# Patient Record
Sex: Female | Born: 1970
Health system: Southern US, Community
[De-identification: ages and names within clinical notes are randomized; demographics above are authoritative.]

## PROBLEM LIST (undated history)

## (undated) DIAGNOSIS — C50919 Malignant neoplasm of unspecified site of unspecified female breast: Secondary | ICD-10-CM

## (undated) DIAGNOSIS — N390 Urinary tract infection, site not specified: Secondary | ICD-10-CM

## (undated) DIAGNOSIS — I10 Essential (primary) hypertension: Secondary | ICD-10-CM

## (undated) HISTORY — PX: BREAST SURGERY: SHX581

## (undated) HISTORY — PX: ROTATOR CUFF REPAIR: SHX139

## (undated) HISTORY — PX: CHOLECYSTECTOMY: SHX55

---

## 1999-05-22 ENCOUNTER — Other Ambulatory Visit: Admission: RE | Admit: 1999-05-22 | Discharge: 1999-05-22 | Payer: Self-pay | Admitting: Obstetrics and Gynecology

## 1999-06-07 ENCOUNTER — Ambulatory Visit (HOSPITAL_COMMUNITY): Admission: RE | Admit: 1999-06-07 | Discharge: 1999-06-07 | Payer: Self-pay | Admitting: Obstetrics and Gynecology

## 1999-06-07 ENCOUNTER — Encounter: Payer: Self-pay | Admitting: Obstetrics and Gynecology

## 1999-07-02 ENCOUNTER — Inpatient Hospital Stay (HOSPITAL_COMMUNITY): Admission: AD | Admit: 1999-07-02 | Discharge: 1999-07-02 | Payer: Self-pay | Admitting: Obstetrics and Gynecology

## 2009-11-18 ENCOUNTER — Emergency Department (HOSPITAL_COMMUNITY): Admission: EM | Admit: 2009-11-18 | Discharge: 2009-11-18 | Payer: Self-pay | Admitting: Emergency Medicine

## 2011-01-24 ENCOUNTER — Emergency Department (HOSPITAL_BASED_OUTPATIENT_CLINIC_OR_DEPARTMENT_OTHER)
Admission: EM | Admit: 2011-01-24 | Discharge: 2011-01-24 | Discharge status: Against Medical Advice | Payer: Managed Care, Other (non HMO) | Attending: Emergency Medicine | Admitting: Emergency Medicine

## 2011-01-24 DIAGNOSIS — G43909 Migraine, unspecified, not intractable, without status migrainosus: Secondary | ICD-10-CM | POA: Insufficient documentation

## 2011-02-05 ENCOUNTER — Emergency Department (HOSPITAL_BASED_OUTPATIENT_CLINIC_OR_DEPARTMENT_OTHER)
Admission: EM | Admit: 2011-02-05 | Discharge: 2011-02-06 | Disposition: A | Discharge status: Nursing Home (Includes ICF) | Payer: Managed Care, Other (non HMO) | Source: Home / Self Care | Attending: Emergency Medicine | Admitting: Emergency Medicine

## 2011-02-05 DIAGNOSIS — R1013 Epigastric pain: Secondary | ICD-10-CM | POA: Insufficient documentation

## 2011-02-05 DIAGNOSIS — K819 Cholecystitis, unspecified: Secondary | ICD-10-CM | POA: Insufficient documentation

## 2011-02-05 DIAGNOSIS — K859 Acute pancreatitis without necrosis or infection, unspecified: Secondary | ICD-10-CM | POA: Insufficient documentation

## 2011-02-05 LAB — COMPREHENSIVE METABOLIC PANEL
ALT: 24 U/L (ref 0–35)
AST: 53 U/L — ABNORMAL HIGH (ref 0–37)
Albumin: 4.4 g/dL (ref 3.5–5.2)
Alkaline Phosphatase: 87 U/L (ref 39–117)
Calcium: 9.1 mg/dL (ref 8.4–10.5)
GFR calc Af Amer: >60 mL/min (ref >60)
Glucose, Bld: 104 mg/dL — ABNORMAL HIGH (ref 70–99)
Potassium: 3.5 mEq/L (ref 3.5–5.1)
Sodium: 142 mEq/L (ref 135–145)
Total Protein: 7.6 g/dL (ref 6.0–8.3)

## 2011-02-05 LAB — URINALYSIS, ROUTINE W REFLEX MICROSCOPIC
Bilirubin Urine: NEGATIVE
Glucose, UA: NEGATIVE mg/dL
Hgb urine dipstick: NEGATIVE
Ketones, ur: NEGATIVE mg/dL
Leukocytes, UA: NEGATIVE
Nitrite: NEGATIVE
Protein, ur: NEGATIVE mg/dL
Specific Gravity, Urine: 1.018 (ref 1.005–1.030)
Urobilinogen, UA: 0.2 mg/dL (ref 0.0–1.0)
pH: 7.5 (ref 5.0–8.0)

## 2011-02-05 LAB — DIFFERENTIAL
Basophils Absolute: 0 10*3/uL (ref 0.0–0.1)
Basophils Relative: 0 % (ref 0–1)
Eosinophils Absolute: 0.3 10*3/uL (ref 0.0–0.7)
Eosinophils Relative: 3 % (ref 0–5)
Lymphocytes Relative: 28 % (ref 12–46)
Lymphs Abs: 2.7 10*3/uL (ref 0.7–4.0)
Monocytes Absolute: 0.6 10*3/uL (ref 0.1–1.0)
Monocytes Relative: 6 % (ref 3–12)
Neutro Abs: 6.1 10*3/uL (ref 1.7–7.7)
Neutrophils Relative %: 63 % (ref 43–77)

## 2011-02-05 LAB — URINE MICROSCOPIC-ADD ON

## 2011-02-05 LAB — CBC
HCT: 40.9 % (ref 36.0–46.0)
Platelets: 244 10*3/uL (ref 150–400)
RDW: 12.6 % (ref 11.5–15.5)
WBC: 9.7 10*3/uL (ref 4.0–10.5)

## 2011-02-05 LAB — PREGNANCY, URINE: Preg Test, Ur: NEGATIVE

## 2011-02-06 ENCOUNTER — Inpatient Hospital Stay (HOSPITAL_COMMUNITY): Payer: Managed Care, Other (non HMO)

## 2011-02-06 ENCOUNTER — Inpatient Hospital Stay (HOSPITAL_COMMUNITY)
Admission: EM | Admit: 2011-02-06 | Discharge: 2011-02-10 | DRG: 417 | Disposition: A | Discharge status: Home / Self Care | Payer: Managed Care, Other (non HMO) | Source: Ambulatory Visit | Attending: Surgery | Admitting: Surgery

## 2011-02-06 ENCOUNTER — Emergency Department (INDEPENDENT_AMBULATORY_CARE_PROVIDER_SITE_OTHER): Payer: Managed Care, Other (non HMO)

## 2011-02-06 DIAGNOSIS — R109 Unspecified abdominal pain: Secondary | ICD-10-CM

## 2011-02-06 DIAGNOSIS — G43909 Migraine, unspecified, not intractable, without status migrainosus: Secondary | ICD-10-CM | POA: Diagnosis present

## 2011-02-06 DIAGNOSIS — K8010 Calculus of gallbladder with chronic cholecystitis without obstruction: Principal | ICD-10-CM | POA: Diagnosis present

## 2011-02-06 DIAGNOSIS — K7689 Other specified diseases of liver: Secondary | ICD-10-CM | POA: Diagnosis present

## 2011-02-06 DIAGNOSIS — K859 Acute pancreatitis without necrosis or infection, unspecified: Secondary | ICD-10-CM | POA: Diagnosis present

## 2011-02-06 MED ORDER — IOHEXOL 300 MG/ML  SOLN
100.0000 mL | Freq: Once | INTRAMUSCULAR | Status: AC | PRN
  Administered 2011-02-06: 100 mL via INTRAVENOUS

## 2011-02-07 LAB — CBC
HCT: 42.4 % (ref 36.0–46.0)
Hemoglobin: 14.4 g/dL (ref 12.0–15.0)
MCHC: 34 g/dL (ref 30.0–36.0)
MCV: 84.6 fL (ref 78.0–100.0)
RDW: 13.1 % (ref 11.5–15.5)

## 2011-02-07 LAB — COMPREHENSIVE METABOLIC PANEL
ALT: 285 U/L — ABNORMAL HIGH (ref 0–35)
Alkaline Phosphatase: 88 U/L (ref 39–117)
BUN: 6 mg/dL (ref 6–23)
CO2: 27 mEq/L (ref 19–32)
Calcium: 8.4 mg/dL (ref 8.4–10.5)
GFR calc non Af Amer: >60 mL/min (ref >60)
Glucose, Bld: 106 mg/dL — ABNORMAL HIGH (ref 70–99)
Total Protein: 6.9 g/dL (ref 6.0–8.3)

## 2011-02-07 LAB — LIPASE, BLOOD: Lipase: 24 U/L (ref 11–59)

## 2011-02-08 ENCOUNTER — Other Ambulatory Visit: Payer: Self-pay | Admitting: Surgery

## 2011-02-08 ENCOUNTER — Inpatient Hospital Stay (HOSPITAL_COMMUNITY): Payer: Managed Care, Other (non HMO)

## 2011-02-08 LAB — MRSA PCR SCREENING: MRSA by PCR: NEGATIVE

## 2011-02-08 NOTE — H&P (Signed)
  [name], [name]             ACCOUNT NO.:  [id number]  MEDICAL RECORD NO.:  [id number]           PATIENT TYPE:  E  LOCATION:  [facility]                         FACILITY:  [facility]  PHYSICIAN:  Wilmon Arms. Corliss Skains, M.D. DATE OF BIRTH:  June 12, 1971  DATE OF ADMISSION:  02/06/2011 DATE OF DISCHARGE:                             HISTORY & PHYSICAL   CHIEF COMPLAINT:  Abdominal pain.  HISTORY OF PRESENT ILLNESS:  This is a healthy 39-year female who presents with about 10 hours of crampy epigastric abdominal pain.  She denies any nausea, vomiting or diarrhea.  The pain has persisted and her abdomen was quite distended.  She denies any nausea, vomiting or diarrhea.  She has been afebrile.  She was evaluated at [facility], who performed a CT scan of the abdomen and pelvis.  She is now transferred for further management.  PAST MEDICAL HISTORY:  Migraine headaches.  PAST SURGICAL HISTORY:  None.  FAMILY HISTORY:  Noncontributory.  SOCIAL HISTORY:  Occasional alcohol use, nonsmoker.  REVIEW OF SYSTEMS:  Last menstrual period on January 21, 2011.  CURRENT MEDICATIONS:  Daily vitamins.  ALLERGIES:  VERSED which causes a rash.  PHYSICAL EXAMINATION:  VITAL SIGNS:  Temperature 98.4, pulse 85, blood pressure 115/96, respiratory rate is 20. GENERAL:  A well-developed, well-nourished female in no apparent distress. HEENT:  EOMI.  Sclerae icteric. NECK:  No mass or thyromegaly. LUNGS:  Clear.  Normal respiratory effort. HEART:  Regular rate and rhythm.  No murmur. ABDOMEN:  Hypoactive bowel sounds, distended abdomen, soft, tender in the epigastrium and right upper quadrant.  No palpable mass. EXTREMITIES:  No edema. SKIN:  With dryness and no sign of jaundice.  LABORATORY DATA:  Lipase greater than 2000.  White count 9.7, hemoglobin 14.6.  Electrolytes within normal limits.  Total bilirubin 1.1, alk phos 87, AST 53, ALT 24.  Urine pregnancy is negative.  CT scan of the abdomen and  pelvis shows pericholecystic edema, but no parapancreatic edema.  She does have fatty liver.  IMPRESSION:  Acute cholecystitis and probable gallstone pancreatitis.  PLAN:  We will admit the patient for IV antibiotics, n.p.o. status.  We will obtain an ultrasound to better define her biliary tree.  We will repeat her blood work.     Wilmon Arms. Corliss Skains, M.D.     MKT/MEDQ  D:  02/06/2011  T:  02/06/2011  Job:  [id number]  Electronically Signed by Manus Rudd M.D. on 02/08/2011 10:31:09 AM

## 2011-02-09 LAB — COMPREHENSIVE METABOLIC PANEL
ALT: 136 U/L — ABNORMAL HIGH (ref 0–35)
AST: 53 U/L — ABNORMAL HIGH (ref 0–37)
Albumin: 3.6 g/dL (ref 3.5–5.2)
CO2: 31 mEq/L (ref 19–32)
Calcium: 8.4 mg/dL (ref 8.4–10.5)
Creatinine, Ser: 0.7 mg/dL (ref 0.4–1.2)
GFR calc Af Amer: >60 mL/min (ref >60)
Sodium: 138 mEq/L (ref 135–145)

## 2011-02-09 LAB — CBC
Hemoglobin: 12.7 g/dL (ref 12.0–15.0)
MCH: 29.3 pg (ref 26.0–34.0)
MCHC: 34.3 g/dL (ref 30.0–36.0)
Platelets: 214 10*3/uL (ref 150–400)
RBC: 4.33 MIL/uL (ref 3.87–5.11)

## 2011-02-21 NOTE — Discharge Summary (Signed)
  [name], [name]             ACCOUNT NO.:  [id number]  MEDICAL RECORD NO.:  [id number]           PATIENT TYPE:  I  LOCATION:  4507                         FACILITY:  [facility]  PHYSICIAN:  Annette Park. Daphine Deutscher, MD  DATE OF BIRTH:  [birth year]  DATE OF ADMISSION:  02/06/2011 DATE OF DISCHARGE:                              DISCHARGE SUMMARY   HISTORY OF PRESENT ILLNESS:  [name] is a pleasant 40 year old African American female who presents with abdominal pain to the emergency department.  She was evaluated there and had a CT scan which showed pericholecystic edema.  Her labs were normal with the exception of her lipase which was significantly elevated at greater than 2000. Dr. Corliss Skains evaluated the patient and decided to admit her for further workup.  SUMMARY OF HOSPITAL COURSE:  The patient was admitted on February 06, 2011. An ultrasound was performed after admission showing evidence of gallstones as well as pericholecystic edema.  Her labs within 24-48 hours normalized and decision was made that the patient would be able to proceed with cholecystectomy.  She was taken to the operating room on February 08, 2011, and underwent laparoscopic cholecystectomy with cholangiogram showing no evidence of residual filling defect. Postoperatively, the patient had some pain control issues and the fact that the opioid analgesics caused severe itching for her, however, she was willing to tolerate the teaching for better pain control.  However, here on postop day #2, that seems to be under better control and the patient is requesting to go home.  She has been eating some, but not having any nausea or vomiting and her pain is well controlled.  At this point, we feel she is stable for discharge home.  DISCHARGE DIAGNOSIS: 1. Biliary pancreatitis - resolved. 2. Cholelithiasis and cholecystitis, status post laparoscopic     cholecystectomy.  DISCHARGE MEDICATIONS:  Percocet 1-2 tablets q.6 h.  p.r.n. pain as well as ibuprofen 600 mg 1 tablet 3 times daily with meals.  Plan to see her back in approximately 2 weeks' time for further followup.     Brayton El, PA-C   ______________________________ Annette Park Daphine Deutscher, MD    KB/MEDQ  D:  02/10/2011  T:  02/10/2011  Job:  [id number]  Electronically Signed by Brayton El  on 02/11/2011 02:11:08 PM Electronically Signed by Luretha Murphy MD on 02/21/2011 08:37:51 AM

## 2011-02-21 NOTE — Op Note (Signed)
  NAMEOLUWANIFEMI, PETITTI             ACCOUNT NO.:  [id number]  MEDICAL RECORD NO.:  [id number]           PATIENT TYPE:  I  LOCATION:  4507                         FACILITY:  [facility]  PHYSICIAN:  Thornton Park. Daphine Deutscher, MD  DATE OF BIRTH:  Aug 06, 1971  DATE OF PROCEDURE:  02/08/2011 DATE OF DISCHARGE:                              OPERATIVE REPORT   PREOPERATIVE DIAGNOSIS:  Cholecystitis.  POSTOPERATIVE DIAGNOSIS:  Chronic cholecystitis.  PROCEDURE:  Laparoscopic cholecystectomy and intraoperative cholangiogram.  SURGEON:  Thornton Park. Daphine Deutscher, MD  ASSISTANT:  None.  ANESTHESIA:  General endotracheal.  FINDINGS:  Intraoperative cholangiogram normal.  DESCRIPTION OF PROCEDURE:  This 40 year old African American female came in with high spiking lipase consistent with biliary pancreatitis on February 06, 2011.  She cooled off and is brought to the OR at this time for laparoscopic cholecystectomy.  The patient was taken to room #17 and given general anesthesia.  The abdomen was prepped with PCMX and draped sterilely.  I entered the abdomen through the right upper quadrant using a 0-degree 5-mm OptiVu technique without difficulty.  The gallbladder was easily visualized, grasped, and elevated.  However, I went ahead and used the scope there and then put in a second 5 below the umbilicus, another 5 on the right side and then a 11 obliquely in the left upper quadrant.  Dissection was then continued and as I lifted up the gallbladder, the infundibulum was really stuck to the duodenum.  I gently peeled this away.  These were chronic adhesions.  This freed up the Calot triangle and I was able to get a critical view.  She had a fairly prominent vessel that was pulsating which I left in place and stayed out to the gallbladder side of that.  I put clips on the gallbladder and on the vessel and then incised the cystic duct milking back a little bit of inspissated black pigment stones.  The  cholangiogram was obtained using a Reddick catheter showing good intrahepatic filling and free flow in the duodenum.  I did not see filling of a pancreatic duct.  The cystic duct was then triple clipped and divided and the cystic artery was triple clipped and divided and I stayed right on the gallbladder and removed the gallbladder without entering it.  It was then placed in a bag and brought out through the upper port taking some care to milk out the stones.  She contained a lot of stony material of different sizes.  Ports were all injected with Marcaine.  I went back and cauterized the fundus area of the liver.  Everything else looked to be in order.  No bleeding or bile leaks were noted.  We noted her to have a dilated bladder and a renal cath was performed at the end of this procedure. The patient was taken to the recovery room in satisfactory condition.     Thornton Park Daphine Deutscher, MD     MBM/MEDQ  D:  02/08/2011  T:  02/09/2011  Job:  [id number]  Electronically Signed by Luretha Murphy MD on 02/21/2011 08:37:54 AM

## 2011-04-01 ENCOUNTER — Emergency Department (HOSPITAL_BASED_OUTPATIENT_CLINIC_OR_DEPARTMENT_OTHER)
Admission: EM | Admit: 2011-04-01 | Discharge: 2011-04-01 | Disposition: A | Discharge status: Home / Self Care | Payer: Managed Care, Other (non HMO) | Attending: Emergency Medicine | Admitting: Emergency Medicine

## 2011-04-01 DIAGNOSIS — IMO0002 Reserved for concepts with insufficient information to code with codable children: Secondary | ICD-10-CM | POA: Insufficient documentation

## 2011-05-29 ENCOUNTER — Encounter: Payer: Self-pay | Admitting: *Deleted

## 2011-05-29 ENCOUNTER — Emergency Department (HOSPITAL_BASED_OUTPATIENT_CLINIC_OR_DEPARTMENT_OTHER)
Admission: EM | Admit: 2011-05-29 | Discharge: 2011-05-29 | Discharge status: Against Medical Advice | Payer: 59 | Attending: Emergency Medicine | Admitting: Emergency Medicine

## 2011-05-29 DIAGNOSIS — G43909 Migraine, unspecified, not intractable, without status migrainosus: Secondary | ICD-10-CM | POA: Insufficient documentation

## 2011-05-29 NOTE — ED Notes (Signed)
In to talk with adult female and pt-advised EDP of his concerns and that he advised he is working with 2 pts that have been here x 4 hrs and he would be in asap-adult female nodded head in agreement-pt did not speak to me but I did advise her that it was her decision to wait to be seen

## 2011-05-29 NOTE — ED Notes (Signed)
Pt and adult female not in room-was not seen leaving or advised of pt leaving

## 2011-05-29 NOTE — ED Notes (Signed)
Adult female with pt to nse desk-? When pt would be seen-in room to talk with him-advised that EDP has signed up for pt and he would be in ASAP-ED extremely busy and that RN can not give meds until seen by EDP-he states pt has not been seen x 1.5 hrs and that they are leaving if not seen "in the next few minutes"-pt lying on stretcher on right side-NAD-did not look up or speak to this RN while in room-EDP notified of the above

## 2011-05-29 NOTE — ED Notes (Signed)
Pt c/o "migraine " x 3 hrs  

## 2011-05-30 ENCOUNTER — Encounter (HOSPITAL_BASED_OUTPATIENT_CLINIC_OR_DEPARTMENT_OTHER): Payer: Self-pay

## 2011-05-30 ENCOUNTER — Emergency Department (HOSPITAL_BASED_OUTPATIENT_CLINIC_OR_DEPARTMENT_OTHER)
Admission: EM | Admit: 2011-05-30 | Discharge: 2011-05-30 | Disposition: A | Discharge status: Home / Self Care | Payer: 59 | Attending: Emergency Medicine | Admitting: Emergency Medicine

## 2011-05-30 DIAGNOSIS — R51 Headache: Secondary | ICD-10-CM | POA: Insufficient documentation

## 2011-05-30 MED ORDER — SODIUM CHLORIDE 0.9 % IV BOLUS (SEPSIS)
1000.0000 mL | Freq: Once | INTRAVENOUS | Status: AC
  Administered 2011-05-30 (×2): 1000 mL via INTRAVENOUS

## 2011-05-30 MED ORDER — DEXAMETHASONE SODIUM PHOSPHATE 10 MG/ML IJ SOLN
10.0000 mg | Freq: Once | INTRAMUSCULAR | Status: AC
  Administered 2011-05-30: 10 mg via INTRAVENOUS
  Filled 2011-05-30: qty 1

## 2011-05-30 MED ORDER — FAMOTIDINE IN NACL 20-0.9 MG/50ML-% IV SOLN
20.0000 mg | Freq: Once | INTRAVENOUS | Status: AC
  Administered 2011-05-30: 20 mg via INTRAVENOUS
  Filled 2011-05-30: qty 50

## 2011-05-30 MED ORDER — DIPHENHYDRAMINE HCL 50 MG/ML IJ SOLN
25.0000 mg | Freq: Once | INTRAMUSCULAR | Status: AC
  Administered 2011-05-30: 25 mg via INTRAVENOUS
  Filled 2011-05-30: qty 1

## 2011-05-30 NOTE — ED Provider Notes (Signed)
History     CSN: [id number] Arrival date & time: 05/30/2011  6:13 PM  Chief Complaint  Patient presents with  . Migraine   Patient is a 40 y.o. female presenting with migraine. The history is provided by the patient.  Migraine This is a recurrent problem. The current episode started yesterday. The problem occurs constantly. The problem has not changed since onset.Pertinent negatives include no chest pain, no abdominal pain, no headaches and no shortness of breath. The symptoms are aggravated by stress. The symptoms are relieved by nothing. She has tried nothing for the symptoms.  feelsl like her typical MHAs, R sided throbbing in nature, no associated F/C, neck stiffness, rash or weakness/ numbness  Past Medical History  Diagnosis Date  . Migraine     Past Surgical History  Procedure Date  . Liver biopsy   . Cholecystectomy     History reviewed. No pertinent family history.  History  Substance Use Topics  . Smoking status: Never Smoker   . Smokeless tobacco: Not on file  . Alcohol Use: No    OB History    Grav Para Term Preterm Abortions TAB SAB Ect Mult Living                  Review of Systems  Constitutional: Negative for fever and chills.  HENT: Negative for neck pain and neck stiffness.   Eyes: Negative for pain.  Respiratory: Negative for shortness of breath.   Cardiovascular: Negative for chest pain.  Gastrointestinal: Negative for abdominal pain.  Genitourinary: Negative for dysuria.  Musculoskeletal: Negative for back pain.  Skin: Negative for rash.  Neurological: Negative for headaches.  All other systems reviewed and are negative.    Physical Exam  BP 158/107  Pulse 80  Temp(Src) 98.7 F (37.1 C) (Oral)  Resp 16  SpO2 100%  LMP 05/16/2011  Physical Exam  Constitutional: She is oriented to person, place, and time. She appears well-developed and well-nourished.  HENT:  Head: Normocephalic and atraumatic.  Eyes: Conjunctivae and EOM are  normal. Pupils are equal, round, and reactive to light.  Neck: Full passive range of motion without pain. Neck supple. No thyromegaly present.       No meningismus  Cardiovascular: Normal rate, regular rhythm, S1 normal, S2 normal and intact distal pulses.   Pulmonary/Chest: Effort normal and breath sounds normal.  Abdominal: Soft. Bowel sounds are normal. There is no tenderness. There is no CVA tenderness.  Musculoskeletal: Normal range of motion.  Neurological: She is alert and oriented to person, place, and time. She has normal strength and normal reflexes. No cranial nerve deficit or sensory deficit. She displays a negative Romberg sign. GCS eye subscore is 4. GCS verbal subscore is 5. GCS motor subscore is 6.       Normal Gait  Skin: Skin is warm and dry. No rash noted. No cyanosis. Nails show no clubbing.  Psychiatric: She has a normal mood and affect. Her speech is normal and behavior is normal.    ED Course  Procedures  MDM Clinical presentation c/w MHA, no neuro deficts, HA cocktail and IVFs provided.   7:14 PM improved on recheck exam, no change normal neuro exam.       Sunnie Nielsen, MD 05/30/11 [phone]

## 2011-07-22 ENCOUNTER — Other Ambulatory Visit: Payer: Self-pay | Admitting: Orthopaedic Surgery

## 2011-07-22 DIAGNOSIS — M25511 Pain in right shoulder: Secondary | ICD-10-CM

## 2011-07-29 NOTE — ED Provider Notes (Signed)
PATIENT NOT SEEN BY ME. SEEN BY DR OPITZ.     Shelda Jakes, MD 07/29/11 [phone]

## 2011-07-31 ENCOUNTER — Encounter (HOSPITAL_BASED_OUTPATIENT_CLINIC_OR_DEPARTMENT_OTHER): Payer: Self-pay

## 2011-07-31 ENCOUNTER — Emergency Department (HOSPITAL_BASED_OUTPATIENT_CLINIC_OR_DEPARTMENT_OTHER)
Admission: EM | Admit: 2011-07-31 | Discharge: 2011-07-31 | Disposition: A | Discharge status: Home / Self Care | Payer: 59 | Attending: Emergency Medicine | Admitting: Emergency Medicine

## 2011-07-31 DIAGNOSIS — N390 Urinary tract infection, site not specified: Secondary | ICD-10-CM

## 2011-07-31 LAB — URINE MICROSCOPIC-ADD ON

## 2011-07-31 LAB — URINALYSIS, ROUTINE W REFLEX MICROSCOPIC
Bilirubin Urine: NEGATIVE
Glucose, UA: NEGATIVE mg/dL
Ketones, ur: NEGATIVE mg/dL
Specific Gravity, Urine: 1.023 (ref 1.005–1.030)
pH: 7.5 (ref 5.0–8.0)

## 2011-07-31 MED ORDER — PHENAZOPYRIDINE HCL 100 MG PO TABS
200.0000 mg | ORAL_TABLET | Freq: Once | ORAL | Status: AC
  Administered 2011-07-31: 200 mg via ORAL
  Filled 2011-07-31: qty 2

## 2011-07-31 MED ORDER — CIPROFLOXACIN HCL 500 MG PO TABS: 500.0000 mg | ORAL_TABLET | Freq: Two times a day (BID) | ORAL | Status: AC

## 2011-07-31 MED ORDER — OXYCODONE-ACETAMINOPHEN 5-325 MG PO TABS
1.0000 | ORAL_TABLET | Freq: Once | ORAL | Status: AC
  Administered 2011-07-31: 1 via ORAL
  Filled 2011-07-31: qty 1

## 2011-07-31 MED ORDER — PHENAZOPYRIDINE HCL 200 MG PO TABS: 200.0000 mg | ORAL_TABLET | Freq: Three times a day (TID) | ORAL | Status: AC

## 2011-07-31 MED ORDER — CIPROFLOXACIN HCL 500 MG PO TABS
500.0000 mg | ORAL_TABLET | Freq: Once | ORAL | Status: AC
  Administered 2011-07-31: 500 mg via ORAL
  Filled 2011-07-31: qty 1

## 2011-07-31 MED ORDER — HYDROCODONE-ACETAMINOPHEN 5-325 MG PO TABS: 1.0000 | ORAL_TABLET | ORAL | Status: AC | PRN

## 2011-07-31 NOTE — ED Provider Notes (Signed)
History     CSN: [id number] Arrival date & time: 07/31/2011  9:22 PM  Chief Complaint  Patient presents with  . Urinary Tract Infection    (Consider location/radiation/quality/duration/timing/severity/associated sxs/prior treatment) Patient is a 40 y.o. female presenting with urinary tract infection. The history is provided by the patient.  Urinary Tract Infection   Patient reports 3 days of urinary frequency and dysuria.  No vaginal discharge.  No fevers nausea or vomiting.  No flank pain.  Today she developed a small amount of hematuria and therefore came to the emergency department.  She has no history of ureteral stones either.  She reports this is similar to urinary tract infection she said before in the past.  She was trying over-the-counter medications such as days ago as well as cranberry juice without relief.  Her symptoms are moderate in nature.  They're not improved by anything.  Are not worsened by Past Medical History  Diagnosis Date  . Migraine     Past Surgical History  Procedure Date  . Liver biopsy   . Cholecystectomy     No family history on file.  History  Substance Use Topics  . Smoking status: Never Smoker   . Smokeless tobacco: Not on file  . Alcohol Use: No    OB History    Grav Para Term Preterm Abortions TAB SAB Ect Mult Living                  Review of Systems  All other systems reviewed and are negative.    Allergies  Versed  Home Medications   Current Outpatient Rx  Name Route Sig Dispense Refill  . ASPIRIN-ACETAMINOPHEN-CAFFEINE 250-250-65 MG PO TABS Oral Take 3 tablets by mouth once as needed. For migraine    . CIPROFLOXACIN HCL 500 MG PO TABS Oral Take 1 tablet (500 mg total) by mouth every 12 (twelve) hours. 14 tablet 0  . HYDROCODONE-ACETAMINOPHEN 5-325 MG PO TABS Oral Take 1 tablet by mouth every 4 (four) hours as needed for pain. 10 tablet 0  . PHENAZOPYRIDINE HCL 200 MG PO TABS Oral Take 1 tablet (200 mg total) by mouth 3  (three) times daily. 6 tablet 0    BP 160/101  Pulse 76  Temp(Src) 98 F (36.7 C) (Oral)  Resp 16  Ht 5\' 1"  (1.549 m)  Wt 165 lb (74.844 kg)  BMI 31.18 kg/m2  SpO2 99%  LMP 07/23/2011  Physical Exam  Constitutional: She is oriented to person, place, and time. She appears well-developed and well-nourished.  HENT:  Head: Normocephalic.  Eyes: EOM are normal.  Neck: Normal range of motion.  Pulmonary/Chest: Effort normal.  Abdominal: Soft. She exhibits no distension. There is no tenderness.  Musculoskeletal: Normal range of motion.  Neurological: She is alert and oriented to person, place, and time.  Psychiatric: She has a normal mood and affect.    ED Course  Procedures (including critical care time)  Labs Reviewed  URINALYSIS, ROUTINE W REFLEX MICROSCOPIC - Abnormal; Notable for the following:    Appearance CLOUDY (*)    Hgb urine dipstick MODERATE (*)    Protein, ur 30 (*)    Leukocytes, UA MODERATE (*)    All other components within normal limits  URINE MICROSCOPIC-ADD ON - Abnormal; Notable for the following:    Squamous Epithelial / LPF FEW (*)    Bacteria, UA FEW (*)    All other components within normal limits  PREGNANCY, URINE   No results found.  1. Urinary tract infection       MDM  Uncomplicated urinary tract infection.  No signs of pyelonephritis.  Doubt secondarily infected ureteral stone        Lyanne Co, MD 07/31/11 2332

## 2011-07-31 NOTE — ED Notes (Signed)
Dysuria, hematuria

## 2011-08-02 ENCOUNTER — Ambulatory Visit
Admission: RE | Admit: 2011-08-02 | Discharge: 2011-08-02 | Disposition: A | Discharge status: Home / Self Care | Payer: 59 | Source: Ambulatory Visit | Attending: Orthopaedic Surgery | Admitting: Orthopaedic Surgery

## 2011-08-02 DIAGNOSIS — M25511 Pain in right shoulder: Secondary | ICD-10-CM

## 2011-08-02 MED ORDER — IOHEXOL 180 MG/ML  SOLN
15.0000 mL | Freq: Once | INTRAMUSCULAR | Status: AC | PRN
  Administered 2011-08-02: 15 mL via INTRATHECAL

## 2011-09-30 ENCOUNTER — Encounter (HOSPITAL_BASED_OUTPATIENT_CLINIC_OR_DEPARTMENT_OTHER): Payer: Self-pay | Admitting: Family Medicine

## 2011-09-30 ENCOUNTER — Emergency Department (HOSPITAL_BASED_OUTPATIENT_CLINIC_OR_DEPARTMENT_OTHER)
Admission: EM | Admit: 2011-09-30 | Discharge: 2011-09-30 | Disposition: A | Discharge status: Home / Self Care | Payer: 59 | Attending: Emergency Medicine | Admitting: Emergency Medicine

## 2011-09-30 DIAGNOSIS — G43909 Migraine, unspecified, not intractable, without status migrainosus: Secondary | ICD-10-CM | POA: Insufficient documentation

## 2011-09-30 DIAGNOSIS — R110 Nausea: Secondary | ICD-10-CM | POA: Insufficient documentation

## 2011-09-30 MED ORDER — METOCLOPRAMIDE HCL 5 MG/ML IJ SOLN
10.0000 mg | Freq: Once | INTRAMUSCULAR | Status: AC
  Administered 2011-09-30: 10 mg via INTRAVENOUS
  Filled 2011-09-30: qty 2

## 2011-09-30 NOTE — ED Provider Notes (Signed)
History     CSN: [id number] Arrival date & time: 09/30/2011  8:21 AM   First MD Initiated Contact with Patient 09/30/11 [phone]      Chief Complaint  Patient presents with  . Migraine    (Consider location/radiation/quality/duration/timing/severity/associated sxs/prior treatment) HPI Complains of typical migraine headache onset upon awakening this morning progressively worsening pain is at left side of head nonradiating throbbing in nature accompanied by nausea and photophobia no other symptoms associated pain similar migraines she gets approximately once per week. Past Medical History  Diagnosis Date  . Migraine     Past Surgical History  Procedure Date  . Liver biopsy   . Cholecystectomy     No family history on file.  History  Substance Use Topics  . Smoking status: Never Smoker   . Smokeless tobacco: Not on file  . Alcohol Use: No    OB History    Grav Para Term Preterm Abortions TAB SAB Ect Mult Living                  Review of Systems  Constitutional: Negative.   HENT: Negative.   Eyes: Positive for photophobia.  Respiratory: Negative.   Cardiovascular: Negative.   Gastrointestinal: Positive for nausea.  Musculoskeletal: Negative.   Skin: Negative.   Neurological: Negative.        Headache  Hematological: Negative.   Psychiatric/Behavioral: Negative.   All other systems reviewed and are negative.    Allergies  Versed  Home Medications   Current Outpatient Rx  Name Route Sig Dispense Refill  . ASPIRIN-ACETAMINOPHEN-CAFFEINE 250-250-65 MG PO TABS Oral Take 2 tablets by mouth once as needed. For migraine    . PRENATAL VITAMINS PO Oral Take 1 tablet by mouth daily.        BP 145/94  Pulse 86  Temp(Src) 98 F (36.7 C) (Oral)  Resp 18  Ht 5\' 4"  (1.626 m)  Wt 135 lb (61.236 kg)  BMI 23.17 kg/m2  SpO2 99%  LMP 09/23/2011  Physical Exam  Nursing note and vitals reviewed. Constitutional: She is oriented to person, place, and time. She  appears well-developed and well-nourished. She appears distressed.       Glasgow Coma Score 15 appears uncomfortable  HENT:  Head: Normocephalic and atraumatic.  Eyes: Conjunctivae are normal. Pupils are equal, round, and reactive to light.       Photophobia  Neck: Neck supple. No tracheal deviation present. No thyromegaly present.  Cardiovascular: Normal rate and regular rhythm.   No murmur heard. Pulmonary/Chest: Effort normal and breath sounds normal.  Abdominal: Soft. Bowel sounds are normal. She exhibits no distension. There is no tenderness.  Musculoskeletal: Normal range of motion. She exhibits no edema and no tenderness.  Neurological: She is alert and oriented to person, place, and time. She has normal reflexes. No cranial nerve deficit. Coordination normal.  Skin: Skin is warm and dry. No rash noted.  Psychiatric: She has a normal mood and affect. Judgment and thought content normal.    ED Course  Procedures (including critical care time)  Labs Reviewed - No data to display No results found.   No diagnosis found.   9:40 AM feels improved after treatment with intravenous Reglan. Feels ready to home alert Glasgow Coma Score 15 appropriate MDM  Diagnosis migraine headache         Doug Sou, MD 09/30/11 [phone]

## 2011-09-30 NOTE — ED Notes (Signed)
Pt c/o "migraine" similar to migraines in past. Pt reports associated sx of nausea and photophobia.  Pt sts she normally takes "topamax" but is out of medication. Pt reports last migraine 2 months ago.

## 2011-11-08 ENCOUNTER — Encounter (HOSPITAL_BASED_OUTPATIENT_CLINIC_OR_DEPARTMENT_OTHER): Payer: Self-pay | Admitting: *Deleted

## 2011-11-08 ENCOUNTER — Emergency Department (HOSPITAL_BASED_OUTPATIENT_CLINIC_OR_DEPARTMENT_OTHER)
Admission: EM | Admit: 2011-11-08 | Discharge: 2011-11-09 | Disposition: A | Discharge status: Home / Self Care | Payer: 59 | Attending: Emergency Medicine | Admitting: Emergency Medicine

## 2011-11-08 DIAGNOSIS — Y93A9 Activity, other involving cardiorespiratory exercise: Secondary | ICD-10-CM | POA: Insufficient documentation

## 2011-11-08 DIAGNOSIS — S4980XA Other specified injuries of shoulder and upper arm, unspecified arm, initial encounter: Secondary | ICD-10-CM | POA: Insufficient documentation

## 2011-11-08 DIAGNOSIS — S4991XA Unspecified injury of right shoulder and upper arm, initial encounter: Secondary | ICD-10-CM

## 2011-11-08 DIAGNOSIS — S46909A Unspecified injury of unspecified muscle, fascia and tendon at shoulder and upper arm level, unspecified arm, initial encounter: Secondary | ICD-10-CM | POA: Insufficient documentation

## 2011-11-08 DIAGNOSIS — X58XXXA Exposure to other specified factors, initial encounter: Secondary | ICD-10-CM | POA: Insufficient documentation

## 2011-11-08 NOTE — ED Notes (Signed)
Pt presents to ED today with c/o roght shoulder pain that began after workout.  Pt had surgery to same area approx 2mos ago.

## 2011-11-08 NOTE — ED Notes (Addendum)
Pt last took ibuprofen 800mg  around 10am

## 2011-11-09 ENCOUNTER — Emergency Department (INDEPENDENT_AMBULATORY_CARE_PROVIDER_SITE_OTHER): Payer: 59

## 2011-11-09 DIAGNOSIS — M25519 Pain in unspecified shoulder: Secondary | ICD-10-CM

## 2011-11-09 MED ORDER — FENTANYL CITRATE 0.05 MG/ML IJ SOLN
50.0000 ug | Freq: Once | INTRAMUSCULAR | Status: AC
  Administered 2011-11-09: 50 ug via INTRAMUSCULAR
  Filled 2011-11-09: qty 2

## 2011-11-09 MED ORDER — HYDROCODONE-ACETAMINOPHEN 5-500 MG PO TABS: 1.0000 | ORAL_TABLET | Freq: Four times a day (QID) | ORAL | Status: DC | PRN

## 2011-11-09 NOTE — ED Provider Notes (Signed)
History     CSN: [id number]  Arrival date & time 11/08/11  2334   First MD Initiated Contact with Patient 11/08/11 2358      Chief Complaint  Patient presents with  . Shoulder Pain    (Consider location/radiation/quality/duration/timing/severity/associated sxs/prior treatment) Patient is a 41 y.o. female presenting with shoulder pain. The history is provided by the patient. No language interpreter was used.  Shoulder Pain This is a new problem. The current episode started more than 2 days ago. The problem occurs constantly. The problem has not changed since onset.Pertinent negatives include no chest pain, no abdominal pain, no headaches and no shortness of breath. Exacerbated by: movement and palpation of the shoulder which had rotator cuff surgery 2.5 months ago. The symptoms are relieved by nothing. She has tried a cold compress for the symptoms. The treatment provided no relief.  Pull downs at the gym Tuesday and pain in the right shoulder since that time.  Left ice on for 3 hours causing cold injury to the skin and told to come to the ED by insurance line nurse.  Has not called Dr. Ophelia Charter who did the procedure to speak with him regarding the pain in the shoulder.  Past Medical History  Diagnosis Date  . Migraine     Past Surgical History  Procedure Date  . Liver biopsy   . Cholecystectomy     History reviewed. No pertinent family history.  History  Substance Use Topics  . Smoking status: Never Smoker   . Smokeless tobacco: Not on file  . Alcohol Use: No    OB History    Grav Para Term Preterm Abortions TAB SAB Ect Mult Living                  Review of Systems  Constitutional: Negative.  Negative for fever.  HENT: Negative.  Negative for neck stiffness.   Eyes: Negative.   Respiratory: Negative for shortness of breath.   Cardiovascular: Negative for chest pain, palpitations and leg swelling.  Gastrointestinal: Negative for abdominal pain and abdominal  distention.  Genitourinary: Negative.   Musculoskeletal: Positive for arthralgias. Negative for back pain and joint swelling.  Neurological: Negative for weakness, numbness and headaches.  Hematological: Negative.   Psychiatric/Behavioral: Negative.     Allergies  Versed  Home Medications   Current Outpatient Rx  Name Route Sig Dispense Refill  . ASPIRIN-ACETAMINOPHEN-CAFFEINE 250-250-65 MG PO TABS Oral Take 2 tablets by mouth once as needed. For migraine    . PRENATAL VITAMINS PO Oral Take 1 tablet by mouth daily.        BP 161/96  Pulse 86  Temp(Src) 99 F (37.2 C) (Oral)  Resp 20  Ht 5\' 1"  (1.549 m)  Wt 140 lb (63.504 kg)  BMI 26.45 kg/m2  SpO2 99%  Physical Exam  Constitutional: She is oriented to person, place, and time. She appears well-developed and well-nourished.  HENT:  Head: Normocephalic and atraumatic.  Mouth/Throat: Oropharynx is clear and moist.  Eyes: Conjunctivae are normal. Pupils are equal, round, and reactive to light.  Neck: Normal range of motion. Neck supple. No tracheal deviation present.  Cardiovascular: Normal rate and regular rhythm.   Pulmonary/Chest: Effort normal and breath sounds normal. She has no wheezes.  Abdominal: Soft. Bowel sounds are normal. There is no tenderness. There is no rebound and no guarding.  Neurological: She is alert and oriented to person, place, and time. She has normal reflexes. She displays normal reflexes.  No winging of the scapula.  5/5 strength in BUE, intact biceps tendon, negative NEERs test of the right shoulder.  Intact sensation.  Neurovascularly intact right hand with cap refill < 2 sec.   skin color change 2 cm in diameter surrounding the incision cw cold injury to the skin.  No purpura no ecchymosis.  No wamrth erythema no fluctuance  Skin: Skin is warm and dry.  Psychiatric: Thought content normal.    ED Course  Procedures (including critical care time)   Labs Reviewed  PREGNANCY, URINE   No  results found.   No diagnosis found.    MDM  Patient told to follow up with Dr. Ophelia Charter who performed her surgery in the morning patient will require further exam by the surgeon and likely other advance diagnostic imaging.  No further ice on the area due to risk of cold injury.  Patient verbalizes understanding and agrees to follow up        Allyiah Gartner Smitty Cords, MD 11/09/11 0140

## 2011-11-09 NOTE — ED Notes (Signed)
Dr. Palumbo at bedside. 

## 2011-11-19 ENCOUNTER — Emergency Department (HOSPITAL_BASED_OUTPATIENT_CLINIC_OR_DEPARTMENT_OTHER)
Admission: EM | Admit: 2011-11-19 | Discharge: 2011-11-19 | Disposition: A | Discharge status: Home / Self Care | Payer: 59 | Attending: Emergency Medicine | Admitting: Emergency Medicine

## 2011-11-19 ENCOUNTER — Encounter (HOSPITAL_BASED_OUTPATIENT_CLINIC_OR_DEPARTMENT_OTHER): Payer: Self-pay | Admitting: *Deleted

## 2011-11-19 DIAGNOSIS — J029 Acute pharyngitis, unspecified: Secondary | ICD-10-CM

## 2011-11-19 DIAGNOSIS — J069 Acute upper respiratory infection, unspecified: Secondary | ICD-10-CM | POA: Insufficient documentation

## 2011-11-19 MED ORDER — IBUPROFEN 800 MG PO TABS: 800.0000 mg | ORAL_TABLET | Freq: Three times a day (TID) | ORAL | Status: AC

## 2011-11-19 NOTE — ED Notes (Signed)
Sore throat xseveral days. Fever since yesterday with body aches. Taking tylenol PRN. Last dose 2 hours ago.

## 2011-11-19 NOTE — ED Provider Notes (Signed)
History     CSN: [id number]  Arrival date & time 11/19/11  1956   First MD Initiated Contact with Patient 11/19/11 2032      Chief Complaint  Patient presents with  . Sore Throat   Patient has had sore throat for the past several days. She also has some low-grade temps at home, and body aches. Also, having a stuffy nose, and a cough. She states her daughter is home sick with similar symptoms. She's taking Tylenol as needed. Denies any significant neck stiffness or rash. Denies any chest pain. (Consider location/radiation/quality/duration/timing/severity/associated sxs/prior treatment) HPI  Past Medical History  Diagnosis Date  . Migraine     Past Surgical History  Procedure Date  . Liver biopsy   . Cholecystectomy     No family history on file.  History  Substance Use Topics  . Smoking status: Never Smoker   . Smokeless tobacco: Not on file  . Alcohol Use: No    OB History    Grav Para Term Preterm Abortions TAB SAB Ect Mult Living                  Review of Systems  All other systems reviewed and are negative.    Allergies  Versed  Home Medications   Current Outpatient Rx  Name Route Sig Dispense Refill  . PRENATAL VITAMINS PO Oral Take 1 tablet by mouth daily.      . IBUPROFEN 800 MG PO TABS Oral Take 1 tablet (800 mg total) by mouth 3 (three) times daily. 21 tablet 0    BP 175/101  Pulse 92  Temp(Src) 99.6 F (37.6 C) (Oral)  Resp 18  Ht 5\' 2"  (1.575 m)  Wt 135 lb (61.236 kg)  BMI 24.69 kg/m2  SpO2 98%  LMP 11/19/2011  Physical Exam  Nursing note and vitals reviewed. Constitutional: She is oriented to person, place, and time. She appears well-developed and well-nourished.  HENT:  Head: Normocephalic and atraumatic.       Posterior oral pharynx shows minimal erythema, no exudate, no ulcerations.  Eyes: Conjunctivae and EOM are normal. Pupils are equal, round, and reactive to light.  Neck: Neck supple.  Cardiovascular: Normal rate and  regular rhythm.  Exam reveals no gallop and no friction rub.   No murmur heard. Pulmonary/Chest: Breath sounds normal. She has no wheezes. She has no rales. She exhibits no tenderness.  Abdominal: Soft. Bowel sounds are normal. She exhibits no distension. There is no tenderness. There is no rebound and no guarding.  Musculoskeletal: Normal range of motion.  Lymphadenopathy:    She has no cervical adenopathy.  Neurological: She is alert and oriented to person, place, and time. No cranial nerve deficit. Coordination normal.  Skin: Skin is warm and dry. No rash noted.  Psychiatric: She has a normal mood and affect.    ED Course  Procedures (including critical care time)   Labs Reviewed  RAPID STREP SCREEN   No results found.   1. Pharyngitis   2. URI (upper respiratory infection)       MDM  Pt is seen and examined;  Initial history and physical completed.  Will follow.    Results for orders placed during the hospital encounter of 11/19/11  RAPID STREP SCREEN      Component Value Range   Streptococcus, Group A Screen (Direct) NEGATIVE  NEGATIVE    Dg Shoulder Right  11/09/2011  *RADIOLOGY REPORT*  Clinical Data: Right shoulder pain.  RIGHT SHOULDER -  2+ VIEW  Comparison: 08/02/2011  Findings: Lucencies are seen within the glenoid, likely subchondral cysts.  No fracture, subluxation or dislocation.  AC joint is unremarkable.  IMPRESSION: No acute bony abnormality.  Original Report Authenticated By: Cyndie Chime, M.D.      Rapid strep test, negative. Ibuprofen given. Likely consistent with viral pharyngitis and possible influenza-like illness.      Trenika Hudson A. Patrica Duel, MD 11/19/11 2055

## 2011-11-22 ENCOUNTER — Telehealth (HOSPITAL_BASED_OUTPATIENT_CLINIC_OR_DEPARTMENT_OTHER): Payer: Self-pay | Admitting: *Deleted

## 2011-11-22 NOTE — ED Notes (Signed)
Mr. Rori Goar called requesting prescription cough medication for his wife [name] who was seen in the ED 11/19/11.  Mr. Abernathy was very upset that his wife was seen by a PA and not a doctor and that they asked for a prescription cough medication at discharge and was told to use OTC Robintussin.  Demanded to have a physician re-review the chart and give a prescription as previously asked for.  Patient was seen by Dr. Valma Cava.  Reviewed chart with Dr. Loren Racer and received order for [facility] 100mg  capsules, 1 capsule TID prn cough, dispense 30 capsules with no refills.  Prescription was called to [facility], [address] [place], [place] @ [phone] and left on the physician's call line.  Call was placed to Mr. Roxan Hockey with confirmation of prescription outcome.

## 2012-05-14 ENCOUNTER — Encounter (HOSPITAL_BASED_OUTPATIENT_CLINIC_OR_DEPARTMENT_OTHER): Payer: Self-pay

## 2012-05-14 ENCOUNTER — Emergency Department (HOSPITAL_BASED_OUTPATIENT_CLINIC_OR_DEPARTMENT_OTHER)
Admission: EM | Admit: 2012-05-14 | Discharge: 2012-05-14 | Disposition: A | Discharge status: Home / Self Care | Payer: 59 | Attending: Emergency Medicine | Admitting: Emergency Medicine

## 2012-05-14 DIAGNOSIS — S46919A Strain of unspecified muscle, fascia and tendon at shoulder and upper arm level, unspecified arm, initial encounter: Secondary | ICD-10-CM

## 2012-05-14 DIAGNOSIS — N390 Urinary tract infection, site not specified: Secondary | ICD-10-CM | POA: Insufficient documentation

## 2012-05-14 DIAGNOSIS — IMO0002 Reserved for concepts with insufficient information to code with codable children: Secondary | ICD-10-CM | POA: Insufficient documentation

## 2012-05-14 DIAGNOSIS — X58XXXA Exposure to other specified factors, initial encounter: Secondary | ICD-10-CM | POA: Insufficient documentation

## 2012-05-14 LAB — URINALYSIS, ROUTINE W REFLEX MICROSCOPIC
Glucose, UA: NEGATIVE mg/dL
Ketones, ur: NEGATIVE mg/dL
Nitrite: NEGATIVE
pH: 7 (ref 5.0–8.0)

## 2012-05-14 LAB — URINE MICROSCOPIC-ADD ON

## 2012-05-14 LAB — PREGNANCY, URINE: Preg Test, Ur: NEGATIVE

## 2012-05-14 MED ORDER — HYDROCODONE-ACETAMINOPHEN 5-500 MG PO TABS: 1.0000 | ORAL_TABLET | Freq: Four times a day (QID) | ORAL | Status: AC | PRN

## 2012-05-14 MED ORDER — SULFAMETHOXAZOLE-TRIMETHOPRIM 800-160 MG PO TABS: 1.0000 | ORAL_TABLET | Freq: Two times a day (BID) | ORAL | Status: AC

## 2012-05-14 NOTE — ED Provider Notes (Signed)
Medical screening examination/treatment/procedure(s) were performed by non-physician practitioner and as supervising physician I was immediately available for consultation/collaboration.  Ethelda Chick, MD 05/14/12 [phone]

## 2012-05-14 NOTE — ED Notes (Signed)
C/o "strain" to right shoulder 4 days ago-also c/o "UTI"-dysuria,hematuria x 2 days

## 2012-05-14 NOTE — ED Provider Notes (Signed)
History     CSN: [id number]  Arrival date & time 05/14/12  1651   First MD Initiated Contact with Patient 05/14/12 1707      Chief Complaint  Patient presents with  . Shoulder Pain  . Urinary Tract Infection    (Consider location/radiation/quality/duration/timing/severity/associated sxs/prior treatment) HPI Comments: Pt states that she caught herself from falling and that is when her right shoulder started to hurt:pt states that her had rotator cuff repaired in the last 6 months, but was feeling better:pt states that he was taking hydrocodone and she ran out:pt states that she is having frequency and hematuria in the last 2 days:pt denies vaginal discharge  Patient is a 41 y.o. female presenting with shoulder pain and urinary tract infection. The history is provided by the patient. No language interpreter was used.  Shoulder Pain This is a new problem. The current episode started in the past 7 days. The problem occurs constantly. The problem has been unchanged. Pertinent negatives include no abdominal pain. The symptoms are aggravated by bending. She has tried immobilization for the symptoms.  Urinary Tract Infection This is a recurrent problem. The current episode started yesterday. The problem occurs constantly. The problem has been unchanged. Pertinent negatives include no abdominal pain.    Past Medical History  Diagnosis Date  . Migraine     Past Surgical History  Procedure Date  . Cholecystectomy   . Rotator cuff repair     No family history on file.  History  Substance Use Topics  . Smoking status: Never Smoker   . Smokeless tobacco: Not on file  . Alcohol Use: No    OB History    Grav Para Term Preterm Abortions TAB SAB Ect Mult Living                  Review of Systems  Constitutional: Negative.   Respiratory: Negative.   Cardiovascular: Negative.   Gastrointestinal: Negative for abdominal pain.    Allergies  Midazolam hcl  Home Medications    Current Outpatient Rx  Name Route Sig Dispense Refill  . ASPIRIN-ACETAMINOPHEN-CAFFEINE 250-250-65 MG PO TABS Oral Take 2 tablets by mouth every 6 (six) hours as needed. For migraines    . PRENATAL VITAMINS PO Oral Take 1 tablet by mouth every morning.       BP 148/102  Pulse 98  Temp 98.5 F (36.9 C) (Oral)  Resp 16  Ht 5\' 1"  (1.549 m)  Wt 130 lb (58.968 kg)  BMI 24.56 kg/m2  SpO2 100%  LMP 05/01/2012  Physical Exam  Nursing note and vitals reviewed. Constitutional: She is oriented to person, place, and time. She appears well-developed and well-nourished.  Cardiovascular: Normal rate and regular rhythm.   Pulmonary/Chest: Effort normal and breath sounds normal.  Abdominal: Soft. Bowel sounds are normal. There is no tenderness.  Musculoskeletal:       Pt tender in the anterior shoulder:pt has full rom:no gross deformity noted:pt neurologically intact  Neurological: She is alert and oriented to person, place, and time. She exhibits normal muscle tone. Coordination normal.  Skin: Skin is warm and dry.  Psychiatric: She has a normal mood and affect.    ED Course  Procedures (including critical care time)  Labs Reviewed  URINALYSIS, ROUTINE W REFLEX MICROSCOPIC - Abnormal; Notable for the following:    APPearance TURBID (*)     Hgb urine dipstick SMALL (*)     Leukocytes, UA MODERATE (*)     All other components  within normal limits  URINE MICROSCOPIC-ADD ON - Abnormal; Notable for the following:    Bacteria, UA FEW (*)     All other components within normal limits  PREGNANCY, URINE   No results found.   1. Shoulder strain   2. UTI (lower urinary tract infection)       MDM  Will treat for simple uti:don't think imaging is needed at this time:likely strain and will have pt follow up        Teressa Lower, NP 05/14/12 1809

## 2012-06-28 ENCOUNTER — Encounter (HOSPITAL_BASED_OUTPATIENT_CLINIC_OR_DEPARTMENT_OTHER): Payer: Self-pay | Admitting: *Deleted

## 2012-06-28 ENCOUNTER — Emergency Department (HOSPITAL_BASED_OUTPATIENT_CLINIC_OR_DEPARTMENT_OTHER)
Admission: EM | Admit: 2012-06-28 | Discharge: 2012-06-28 | Disposition: A | Discharge status: Home / Self Care | Payer: 59 | Attending: Emergency Medicine | Admitting: Emergency Medicine

## 2012-06-28 DIAGNOSIS — G43909 Migraine, unspecified, not intractable, without status migrainosus: Secondary | ICD-10-CM

## 2012-06-28 MED ORDER — METOCLOPRAMIDE HCL 5 MG/ML IJ SOLN
10.0000 mg | Freq: Once | INTRAMUSCULAR | Status: AC
  Administered 2012-06-28: 10 mg via INTRAMUSCULAR
  Filled 2012-06-28: qty 2

## 2012-06-28 MED ORDER — DIPHENHYDRAMINE HCL 50 MG/ML IJ SOLN
25.0000 mg | Freq: Once | INTRAMUSCULAR | Status: AC
  Administered 2012-06-28: 25 mg via INTRAMUSCULAR
  Filled 2012-06-28: qty 1

## 2012-06-28 MED ORDER — DEXAMETHASONE SODIUM PHOSPHATE 10 MG/ML IJ SOLN
10.0000 mg | Freq: Once | INTRAMUSCULAR | Status: AC
  Administered 2012-06-28: 10 mg via INTRAMUSCULAR
  Filled 2012-06-28: qty 1

## 2012-06-28 NOTE — ED Provider Notes (Signed)
History     CSN: [id number]  Arrival date & time 06/28/12  1328   First MD Initiated Contact with Patient 06/28/12 1727      Chief Complaint  Patient presents with  . Migraine    (Consider location/radiation/quality/duration/timing/severity/associated sxs/prior treatment) The history is provided by the patient and medical records.    [name] is a 41 y.o. female presents to the emergency department complaining of migraine.  The onset of the symptoms was  abrupt starting 8 hours ago.  The patient has associated photophobia, nausea, vomiting.  The symptoms have been  persistent, gradually worsened.  nothing makes the symptoms worse and nothing makes symptoms better.  The patient denies , chills, neck pain, chest pain, shortness of breath, diarrhea, constipation, weakness, dizziness, lightheadedness.  Patient states she has a history of migraine for which she used to take Topamax. She's been trying to get pregnant therefore she stopped taking the Topamax. Her headaches have increased exponentially since that time. Pt took excedrine migraine this morning without relief.  She states the pain as throbbing located on the right side of her head. She states is the same as all of her previous migraines. She also complains of nausea, vomiting and photophobia.   Past Medical History  Diagnosis Date  . Migraine     Past Surgical History  Procedure Date  . Cholecystectomy   . Rotator cuff repair     History reviewed. No pertinent family history.  History  Substance Use Topics  . Smoking status: Never Smoker   . Smokeless tobacco: Not on file  . Alcohol Use: No    OB History    Grav Para Term Preterm Abortions TAB SAB Ect Mult Living                  Review of Systems  Constitutional: Negative for fever, diaphoresis, appetite change, fatigue and unexpected weight change.  HENT: Negative for ear pain, mouth sores, neck pain, neck stiffness and tinnitus.   Eyes: Positive for  photophobia. Negative for visual disturbance.  Respiratory: Negative for cough, chest tightness, shortness of breath and wheezing.   Cardiovascular: Negative for chest pain.  Gastrointestinal: Positive for nausea and vomiting. Negative for abdominal pain, diarrhea and constipation.  Genitourinary: Negative for dysuria, urgency, frequency and hematuria.  Skin: Negative for rash.  Neurological: Positive for headaches. Negative for syncope and light-headedness.  Psychiatric/Behavioral: Negative for disturbed wake/sleep cycle. The patient is not nervous/anxious.     Allergies  Midazolam hcl  Home Medications   Current Outpatient Rx  Name Route Sig Dispense Refill  . ASPIRIN-ACETAMINOPHEN-CAFFEINE 250-250-65 MG PO TABS Oral Take 2 tablets by mouth every 6 (six) hours as needed. For migraines    . PRENATAL 27-0.8 MG PO TABS Oral Take 1 tablet by mouth daily.      BP 155/98  Pulse 78  Temp 98.1 F (36.7 C) (Oral)  Resp 18  Ht 5\' 2"  (1.575 m)  Wt 155 lb (70.308 kg)  BMI 28.35 kg/m2  SpO2 100%  LMP 06/27/2012  Physical Exam  Nursing note and vitals reviewed. Constitutional: She appears well-developed and well-nourished. No distress.  HENT:  Head: Normocephalic and atraumatic.  Mouth/Throat: Oropharynx is clear and moist. No oropharyngeal exudate.  Eyes: Conjunctivae and EOM are normal. Pupils are equal, round, and reactive to light. No scleral icterus.  Neck: Normal range of motion. Neck supple.  Cardiovascular: Normal rate, regular rhythm and intact distal pulses.   Pulmonary/Chest: Effort normal and breath  sounds normal. No respiratory distress. She has no wheezes.  Abdominal: Soft. Bowel sounds are normal. She exhibits no mass. There is no tenderness. There is no rebound and no guarding.  Musculoskeletal: Normal range of motion. She exhibits no edema.  Neurological: She is alert.       Speech is clear and goal oriented, follows commands Major Cranial nerves without deficit, no  facial droop Normal strength in upper and lower extremities bilaterally including dorsiflexion and plantar flexion, strong and equal grip strength Sensation normal to light and sharp touch Moves extremities without ataxia, coordination intact Normal finger to nose and rapid alternating movements Normal gait Normal balance   Skin: Skin is warm and dry. She is not diaphoretic.  Psychiatric: She has a normal mood and affect.    ED Course  Procedures (including critical care time)  Labs Reviewed - No data to display No results found.   1. Migraine       MDM  [name] presents with headache. This headache is like her other known migraines.  She is healthy nontoxic nonseptic appearing. Vitals within normal limits.  Patient given migraine cocktail.  Symptoms resolved here in the emergency department. Patient feels as if she can go home.  I discussed followup with her primary care physician and discussion of other migraine therapies.  I have also discussed reasons to return immediately to the ER.  Patient expresses understanding and agrees with plan.  1. Medications: Usual home medications 2. Treatment: Rest, drink plenty of fluids, Tylenol or ibuprofen for continued pain control 3. Follow Up: MA care physician for discussion of further migraine treatment    Schedule follow up with your physician in regards to today's migraine and to discuss possible prophylactic treatment (this means medications to prevent migraines). If you do not have a doctor use the resource guide listed below to help you find one.  Migraine Headache  A migraine headache is an intense, throbbing pain on one or both sides of your head. The exact cause of a migraine headache is not always known. A migraine may be caused when nerves in the brain become irritated and release chemicals that cause swelling within blood vessels, causing pain. Many migraine sufferers have a family history of migraines. Before you get  a migraine you may or may not get an aura. An aura is a group of symptoms that can predict the beginning of a migraine. An aura may include:  Visual changes such as:  Flashing lights. And in and here in the Qual a Bright spots or zig-zag lines.  Tunnel vision.  Feelings of numbness.  Trouble talking.  Muscle weakness.  SYMPTOMS  Pain on one or both sides of your head.  Pain that is pulsating or throbbing in nature.  Pain that is severe enough to prevent daily activities.  Pain that is aggravated by any daily physical activity.  Nausea (feeling sick to your stomach), vomiting, or both.  Pain with exposure to bright lights, loud noises, or activity.  General sensitivity to bright lights or loud noises.  MIGRAINE TRIGGERS  Examples of triggers of migraine headaches include:  Alcohol.  Smoking.  Stress.  It may be related to menses (female menstruation).  Aged cheeses.  Foods or drinks that contain nitrates, glutamate, aspartame, or tyramine.  Lack of sleep.  Chocolate.  Caffeine.  Hunger.  Medications such as nitroglycerine (used to treat chest pain), birth control pills, estrogen, and some blood pressure medications.   TREATMENT  Medications can  help prevent migraines if they are recurrent or should they become recurrent. Your caregiver can help you with a medication or treatment program that will be helpful to you.  Lying down in a dark, quiet room may be helpful.  Keeping a headache diary may help you find a trend as to what may be triggering your headaches.  SEEK IMMEDIATE MEDICAL CARE IF:  You have confusion, personality changes or seizures.  You have headaches that wake you from sleep.  You have an increased frequency in your headaches.  You have a stiff neck.  You have a loss of vision.  You have muscle weakness.  You start losing your balance or have trouble walking.  You feel faint or pass out.  MAKE SURE YOU:  Understand these instructions.  Will watch your  condition.  Will get help right away if you are not doing well or get worse.   RESOURCE GUIDE  Chronic Pain Problems: Contact [facility]  [phone] Patients need to be referred by their primary care doctor.  Insufficient Money for Medicine: Contact United Way:  call "211" or [facility] [phone].  No Primary Care Doctor: - Call [facility]  [phone] - can help you locate a primary care doctor that  accepts your insurance, provides certain services, etc. - Physician Referral Service- [phone]  Agencies that provide inexpensive medical care: - [facility]  [phone] - [facility]  [phone] - [facility]  [phone] - Women's Clinic  [phone] - [facility]  [phone] [facility]  [phone]  Medicaid-accepting [facility] Providers: - [facility]- [address]  [phone], Mon-Fri 9am-7pm, Sat 9am-1pm - [facility]- [address] [place], [address] [place]  [phone] - [facility]- [address] [place]  [phone] [facility]- [address]  [phone] - Renaye Rakers- [address] [place], [address], [phone]  Only accepts [place] patients after they have their name  applied to their card  Self Pay (no insurance) in [place]: - Sickle Cell Patients: Dr Willey Blade, [facility]  [address] [place], [phone] - [facility]- [address] [place]  [phone]       [facility] [place]- [address]       -     [facility]- see information above (Speak to [employer] if you do not have insurance)       -  [facility]- [address] [place], [phone]       -  [facility]- [address],  [phone]       -  [facility]- [address] [phone]       -  Dr Julio Sicks-  [address], [place], [phone]       -  [facility]- [address] [phone]       -  [facility]- [address], [phone], also [address], [phone]       -    [facility]- [address] [place], [phone], 1st & 3rd Saturday   every month, 10am-1pm  1) Find a Doctor and Pay Out of Pocket Although you won't have to find out who  is covered by your insurance plan, it is a good idea to ask around and get recommendations. You will then need to call the office and see if the doctor you have chosen will accept you as a new patient and what types of options they offer for patients who are self-pay. Some doctors offer discounts or will set up payment plans for their patients who do not have insurance, but you will need to ask so you aren't surprised when you get to your appointment.  2) Contact Your Local Health Department Not all health departments have doctors that can see patients for sick visits, but many do, so it is worth a call to see if yours does. If you don't know where your local health department is, you can check in your phone book. The CDC also has a tool to help you locate your state's health department, and many state websites also have listings of all of their local health departments.  3) Find a Walk-in Clinic If your illness is not likely to be very severe or complicated, you may want to try a walk in clinic. These are popping up all over the country in pharmacies, drugstores, and shopping centers. They're usually staffed by nurse practitioners or physician assistants that have been trained to treat common illnesses and complaints. They're usually fairly quick and inexpensive. However, if you have serious medical issues or chronic medical problems, these are probably not your best option  STD Testing - [facility] Department of [facility], STD Clinic, [address], [place], phone [phone] or  [phone].  Monday - Friday, call for an appointment. [facility] Department of [employer], STD Clinic, [address], [place], phone [phone] or [phone].  Monday - Friday, call for an appointment.  Abuse/Neglect: [facility] Child Abuse Hotline 346-082-9029 [place] Child Abuse Hotline 4800379867 (After Hours)  Emergency Shelter:  [facility] 2244566740  Maternity Homes: - [facility] [phone] - [facility] [phone]  MRSA Hotline #:   718 720 2575  [facility] Resources  [facility] [place]  [employer] [facility] Dept. [address]                 [address]         [address] [place] [address]  [place]ondell Reveal Phone:  [phone]                                  Phone:  [phone]                   Phone:  [phone]  [facility] Health, [phone] - [phone]       -     [facility] in [place], [address],  [phone], [facility] Child Abuse Hotline (929) 458-6715 or (331) 545-3314 (After Hours)   Behavioral Health Services  Substance Abuse Resources: - [facility] Drug Services  (301)009-3899 - [facility] [phone] - [facility] [place] [phone] Floydene Flock [phone] - Residential & Outpatient Substance Abuse Program  [phone]  Psychological Services: [facility]  [phone] [facility]  [phone] - [facility], [phone] [place]. [address] [place], ACCESS LINE: (831)082-0673 or [phone], [email or web address]  Dental Assistance  If unable to pay or uninsured, contact:  [facility] or  [facility]. to become qualified for the adult dental clinic.  Patients with Medicaid: [facility] [phone] [address], [phone] [address], [phone]  If unable to pay, or uninsured, contact [facility] [phone]) or [facility] Department ([phone] in [place], [phone] in [facility]) to become qualified for the adult dental clinic  Other Low-Cost Community Dental Services: - [facility]- [address] [place], [place], [place], [address], [phone], [address]. [phone], 2nd and 4th Thursday of the month at 6:30am.  10 clients each day by appointment, can sometimes see walk-in patients if someone does not show for an appointment. [facility]- [address] [place], [place], [address], [phone] - [facility]- [address], [place], [place], [address], [phone] [facility] Health Department- [phone] [facility] Health Department- [phone] [facility] Department- [phone]            Dierdre Forth, PA-C 06/28/12 [phone]

## 2012-06-28 NOTE — ED Notes (Signed)
Pt has hx of migraines and this one stared this a.m. +nausea PERL

## 2012-06-29 NOTE — ED Provider Notes (Signed)
Medical screening examination/treatment/procedure(s) were performed by non-physician practitioner and as supervising physician I was immediately available for consultation/collaboration.   Cassady Turano, MD 06/29/12 0033 

## 2012-10-25 ENCOUNTER — Emergency Department (HOSPITAL_BASED_OUTPATIENT_CLINIC_OR_DEPARTMENT_OTHER)
Admission: EM | Admit: 2012-10-25 | Discharge: 2012-10-25 | Disposition: A | Discharge status: Home / Self Care | Payer: 59 | Attending: Emergency Medicine | Admitting: Emergency Medicine

## 2012-10-25 ENCOUNTER — Encounter (HOSPITAL_BASED_OUTPATIENT_CLINIC_OR_DEPARTMENT_OTHER): Payer: Self-pay | Admitting: *Deleted

## 2012-10-25 DIAGNOSIS — K5289 Other specified noninfective gastroenteritis and colitis: Secondary | ICD-10-CM | POA: Insufficient documentation

## 2012-10-25 DIAGNOSIS — K529 Noninfective gastroenteritis and colitis, unspecified: Secondary | ICD-10-CM

## 2012-10-25 DIAGNOSIS — Z3202 Encounter for pregnancy test, result negative: Secondary | ICD-10-CM | POA: Insufficient documentation

## 2012-10-25 DIAGNOSIS — G43909 Migraine, unspecified, not intractable, without status migrainosus: Secondary | ICD-10-CM | POA: Insufficient documentation

## 2012-10-25 DIAGNOSIS — R197 Diarrhea, unspecified: Secondary | ICD-10-CM | POA: Insufficient documentation

## 2012-10-25 DIAGNOSIS — R112 Nausea with vomiting, unspecified: Secondary | ICD-10-CM | POA: Insufficient documentation

## 2012-10-25 DIAGNOSIS — R51 Headache: Secondary | ICD-10-CM | POA: Insufficient documentation

## 2012-10-25 DIAGNOSIS — Z7982 Long term (current) use of aspirin: Secondary | ICD-10-CM | POA: Insufficient documentation

## 2012-10-25 DIAGNOSIS — IMO0001 Reserved for inherently not codable concepts without codable children: Secondary | ICD-10-CM | POA: Insufficient documentation

## 2012-10-25 LAB — CBC WITH DIFFERENTIAL/PLATELET
Eosinophils Relative: 4 % (ref 0–5)
HCT: 42.7 % (ref 36.0–46.0)
Lymphocytes Relative: 50 % — ABNORMAL HIGH (ref 12–46)
Lymphs Abs: 2.7 10*3/uL (ref 0.7–4.0)
MCH: 29.3 pg (ref 26.0–34.0)
MCV: 82.8 fL (ref 78.0–100.0)
Monocytes Absolute: 0.6 10*3/uL (ref 0.1–1.0)
Monocytes Relative: 11 % (ref 3–12)
RBC: 5.16 MIL/uL — ABNORMAL HIGH (ref 3.87–5.11)
WBC: 5.5 10*3/uL (ref 4.0–10.5)

## 2012-10-25 LAB — URINALYSIS, ROUTINE W REFLEX MICROSCOPIC
Bilirubin Urine: NEGATIVE
Glucose, UA: NEGATIVE mg/dL
Hgb urine dipstick: NEGATIVE
Protein, ur: NEGATIVE mg/dL
Specific Gravity, Urine: 1.021 (ref 1.005–1.030)

## 2012-10-25 LAB — BASIC METABOLIC PANEL
CO2: 26 mEq/L (ref 19–32)
Chloride: 99 mEq/L (ref 96–112)
Glucose, Bld: 83 mg/dL (ref 70–99)
Sodium: 139 mEq/L (ref 135–145)

## 2012-10-25 LAB — PREGNANCY, URINE: Preg Test, Ur: NEGATIVE

## 2012-10-25 MED ORDER — ONDANSETRON 4 MG PO TBDP: 4.0000 mg | ORAL_TABLET | Freq: Three times a day (TID) | ORAL | Status: DC | PRN

## 2012-10-25 MED ORDER — HYDROMORPHONE HCL PF 1 MG/ML IJ SOLN
1.0000 mg | Freq: Once | INTRAMUSCULAR | Status: AC
  Administered 2012-10-25: 1 mg via INTRAVENOUS
  Filled 2012-10-25: qty 1

## 2012-10-25 MED ORDER — POTASSIUM CHLORIDE CRYS ER 20 MEQ PO TBCR: 20.0000 meq | EXTENDED_RELEASE_TABLET | Freq: Two times a day (BID) | ORAL | Status: DC

## 2012-10-25 MED ORDER — SODIUM CHLORIDE 0.9 % IV SOLN: INTRAVENOUS | Status: DC

## 2012-10-25 MED ORDER — LOPERAMIDE HCL 2 MG PO TABS: 2.0000 mg | ORAL_TABLET | Freq: Four times a day (QID) | ORAL | Status: DC | PRN

## 2012-10-25 MED ORDER — ONDANSETRON HCL 4 MG/2ML IJ SOLN
4.0000 mg | Freq: Once | INTRAMUSCULAR | Status: AC
  Administered 2012-10-25: 4 mg via INTRAVENOUS
  Filled 2012-10-25: qty 2

## 2012-10-25 MED ORDER — SODIUM CHLORIDE 0.9 % IV BOLUS (SEPSIS)
1000.0000 mL | Freq: Once | INTRAVENOUS | Status: AC
  Administered 2012-10-25: 1000 mL via INTRAVENOUS

## 2012-10-25 MED ORDER — PROMETHAZINE HCL 25 MG PO TABS: 25.0000 mg | ORAL_TABLET | Freq: Four times a day (QID) | ORAL | Status: DC | PRN

## 2012-10-25 NOTE — ED Provider Notes (Signed)
History  This chart was scribed for Annette Jakes, MD by Shari Heritage, ED Scribe. The patient was seen in room MH07/MH07. Patient's care was started at 1752.  CSN: [id number]  Arrival date & time 10/25/12  1525   First MD Initiated Contact with Patient 10/25/12 1752      Chief Complaint  Patient presents with  . Abdominal Pain     Patient is a 42 y.o. female presenting with abdominal pain. The history is provided by the patient. No language interpreter was used.  Abdominal Pain The primary symptoms of the illness include abdominal pain, nausea, vomiting and diarrhea. The primary symptoms of the illness do not include fever or dysuria. The current episode started 2 days ago. The onset of the illness was sudden.  The abdominal pain began 2 days ago. The pain came on suddenly. The abdominal pain has been unchanged since its onset. The abdominal pain is generalized. The abdominal pain does not radiate. The abdominal pain is relieved by nothing.  Nausea began 2 days ago.  The vomiting began 2 days ago. Vomiting occurs 2 to 5 times per day. The emesis contains stomach contents.  The diarrhea occurs continuously.    HPI Comments: [name] is a 42 y.o. female who presents to the Emergency Department complaining of moderate, constant, non-radiating, general abdominal pain, vomiting and diarrhea onset 2 days ago. There is associated body aches and headache. Patient says that she felt well on Thursday, and current symptoms began with vomiting then diarrhea on Friday. Today, patient has had continuous diarrhea and 1 episode of vomiting. Patient says that body aches started on Friday as well, but headache began today. Patient denies blood in stool or emesis, cough, congestion, rhinorrhea, fever, visual changes, chest pain, dysuria, rash, or bleeding easily. Patient has had decreased fluid and food intake. Patient hasn't taken any medicines at home for this pain. Patient has a medical history  of migraine. She has a surgical history of cholecystectomy.    Past Medical History  Diagnosis Date  . Migraine     Past Surgical History  Procedure Date  . Cholecystectomy   . Rotator cuff repair     History reviewed. No pertinent family history.  History  Substance Use Topics  . Smoking status: Never Smoker   . Smokeless tobacco: Not on file  . Alcohol Use: No    OB History    Grav Para Term Preterm Abortions TAB SAB Ect Mult Living                  Review of Systems  Constitutional: Negative for fever.  HENT: Negative for congestion, sore throat and rhinorrhea.   Eyes: Negative for visual disturbance.  Respiratory: Negative for cough.   Cardiovascular: Negative for chest pain.  Gastrointestinal: Positive for nausea, vomiting, abdominal pain and diarrhea.  Genitourinary: Negative for dysuria.  Musculoskeletal: Positive for myalgias.  Skin: Negative for rash.  Neurological: Positive for headaches.  Hematological: Does not bruise/bleed easily.    Allergies  Midazolam hcl  Home Medications   Current Outpatient Rx  Name  Route  Sig  Dispense  Refill  . ASPIRIN-ACETAMINOPHEN-CAFFEINE 250-250-65 MG PO TABS   Oral   Take 2 tablets by mouth every 6 (six) hours as needed. For migraines         . LOPERAMIDE HCL 2 MG PO TABS   Oral   Take 1 tablet (2 mg total) by mouth 4 (four) times daily as needed for diarrhea  or loose stools.   30 tablet   0   . ONDANSETRON 4 MG PO TBDP   Oral   Take 1 tablet (4 mg total) by mouth every 8 (eight) hours as needed for nausea.   12 tablet   0   . POTASSIUM CHLORIDE CRYS ER 20 MEQ PO TBCR   Oral   Take 1 tablet (20 mEq total) by mouth 2 (two) times daily.   10 tablet   0   . PRENATAL 27-0.8 MG PO TABS   Oral   Take 1 tablet by mouth daily.         Marland Kitchen PROMETHAZINE HCL 25 MG PO TABS   Oral   Take 1 tablet (25 mg total) by mouth every 6 (six) hours as needed for nausea.   12 tablet   0     Triage Vitals: BP  149/99  Pulse 71  Temp 98.1 F (36.7 C) (Oral)  Resp 20  Ht 5\' 1"  (1.549 m)  Wt 145 lb (65.772 kg)  BMI 27.40 kg/m2  SpO2 100%  LMP 11/17/2011  Physical Exam  Constitutional: She is oriented to person, place, and time. She appears well-developed and well-nourished. No distress.  HENT:  Head: Normocephalic and atraumatic.  Mouth/Throat: Oropharynx is clear and moist and mucous membranes are normal. Mucous membranes are not dry. No oropharyngeal exudate or posterior oropharyngeal erythema.  Eyes: Conjunctivae normal and EOM are normal. Pupils are equal, round, and reactive to light.       Sclera are clear.  Neck: Neck supple.  Cardiovascular: Normal rate and regular rhythm.   No murmur heard. Pulmonary/Chest: Effort normal and breath sounds normal. No respiratory distress. She has no wheezes. She has no rales.  Abdominal: Soft. Bowel sounds are normal. She exhibits no distension. There is no tenderness. There is no rebound and no guarding.  Musculoskeletal: Normal range of motion. She exhibits no edema and no tenderness.       No swelling in ankles.  Lymphadenopathy:    She has no cervical adenopathy.  Neurological: She is alert and oriented to person, place, and time. No cranial nerve deficit.  Skin: Skin is warm and dry. No rash noted.  Psychiatric: She has a normal mood and affect. Her behavior is normal.    ED Course  Procedures (including critical care time) DIAGNOSTIC STUDIES: Oxygen Saturation is 100% on room air, normal by my interpretation.    COORDINATION OF CARE: 6:15 PM- Patient informed of current plan for treatment and evaluation and agrees with plan at this time.    Results for orders placed during the hospital encounter of 10/25/12  URINALYSIS, ROUTINE W REFLEX MICROSCOPIC      Component Value Range   Color, Urine YELLOW  YELLOW   APPearance CLEAR  CLEAR   Specific Gravity, Urine 1.021  1.005 - 1.030   pH 6.5  5.0 - 8.0   Glucose, UA NEGATIVE  NEGATIVE  mg/dL   Hgb urine dipstick NEGATIVE  NEGATIVE   Bilirubin Urine NEGATIVE  NEGATIVE   Ketones, ur NEGATIVE  NEGATIVE mg/dL   Protein, ur NEGATIVE  NEGATIVE mg/dL   Urobilinogen, UA 0.2  0.0 - 1.0 mg/dL   Nitrite NEGATIVE  NEGATIVE   Leukocytes, UA NEGATIVE  NEGATIVE  PREGNANCY, URINE      Component Value Range   Preg Test, Ur NEGATIVE  NEGATIVE  BASIC METABOLIC PANEL      Component Value Range   Sodium 139  135 - 145 mEq/L  Potassium 3.3 (*) 3.5 - 5.1 mEq/L   Chloride 99  96 - 112 mEq/L   CO2 26  19 - 32 mEq/L   Glucose, Bld 83  70 - 99 mg/dL   BUN 13  6 - 23 mg/dL   Creatinine, Ser 1.19  0.50 - 1.10 mg/dL   Calcium 9.3  8.4 - 14.7 mg/dL   GFR calc non Af Amer >90  >90 mL/min   GFR calc Af Amer >90  >90 mL/min  CBC WITH DIFFERENTIAL      Component Value Range   WBC 5.5  4.0 - 10.5 K/uL   RBC 5.16 (*) 3.87 - 5.11 MIL/uL   Hemoglobin 15.1 (*) 12.0 - 15.0 g/dL   HCT 82.9  56.2 - 13.0 %   MCV 82.8  78.0 - 100.0 fL   MCH 29.3  26.0 - 34.0 pg   MCHC 35.4  30.0 - 36.0 g/dL   RDW 86.5  78.4 - 69.6 %   Platelets 227  150 - 400 K/uL   Neutrophils Relative 35 (*) 43 - 77 %   Neutro Abs 1.9  1.7 - 7.7 K/uL   Lymphocytes Relative 50 (*) 12 - 46 %   Lymphs Abs 2.7  0.7 - 4.0 K/uL   Monocytes Relative 11  3 - 12 %   Monocytes Absolute 0.6  0.1 - 1.0 K/uL   Eosinophils Relative 4  0 - 5 %   Eosinophils Absolute 0.2  0.0 - 0.7 K/uL   Basophils Relative 0  0 - 1 %   Basophils Absolute 0.0  0.0 - 0.1 K/uL    No results found.   1. Gastroenteritis       MDM  Patient symptoms consistent with viral gastroenteritis. No significant electrolyte abnormalities mild hypokalemia. No evidence of significant dehydration. Patient improved with fluids anti-medics and pain medicine in the ED. White blood cell count is not elevated suggestive of viral process. Patient we discharged home with Phenergan and Zofran and Imodium some potassium supplementation and work note.      I personally  performed the services described in this documentation, which was scribed in my presence. The recorded information has been reviewed and is accurate.     Annette Jakes, MD 10/25/12 2036

## 2012-10-25 NOTE — ED Notes (Signed)
Abd pain, headache, diarrhea x 2 days

## 2012-10-27 ENCOUNTER — Emergency Department (HOSPITAL_BASED_OUTPATIENT_CLINIC_OR_DEPARTMENT_OTHER)
Admission: EM | Admit: 2012-10-27 | Discharge: 2012-10-27 | Disposition: A | Discharge status: Home / Self Care | Payer: 59 | Attending: Emergency Medicine | Admitting: Emergency Medicine

## 2012-10-27 ENCOUNTER — Encounter (HOSPITAL_BASED_OUTPATIENT_CLINIC_OR_DEPARTMENT_OTHER): Payer: Self-pay | Admitting: *Deleted

## 2012-10-27 DIAGNOSIS — R1110 Vomiting, unspecified: Secondary | ICD-10-CM | POA: Insufficient documentation

## 2012-10-27 DIAGNOSIS — Z79899 Other long term (current) drug therapy: Secondary | ICD-10-CM | POA: Insufficient documentation

## 2012-10-27 DIAGNOSIS — M62838 Other muscle spasm: Secondary | ICD-10-CM | POA: Insufficient documentation

## 2012-10-27 DIAGNOSIS — Z3202 Encounter for pregnancy test, result negative: Secondary | ICD-10-CM | POA: Insufficient documentation

## 2012-10-27 DIAGNOSIS — Z8679 Personal history of other diseases of the circulatory system: Secondary | ICD-10-CM | POA: Insufficient documentation

## 2012-10-27 LAB — URINALYSIS, ROUTINE W REFLEX MICROSCOPIC
Bilirubin Urine: NEGATIVE
Glucose, UA: NEGATIVE mg/dL
Hgb urine dipstick: NEGATIVE
Ketones, ur: NEGATIVE mg/dL
Leukocytes, UA: NEGATIVE
pH: 7 (ref 5.0–8.0)

## 2012-10-27 MED ORDER — IBUPROFEN 800 MG PO TABS: 800.0000 mg | ORAL_TABLET | Freq: Three times a day (TID) | ORAL | Status: DC | PRN

## 2012-10-27 MED ORDER — OXYCODONE-ACETAMINOPHEN 5-325 MG PO TABS
2.0000 | ORAL_TABLET | Freq: Once | ORAL | Status: AC
  Administered 2012-10-27: 2 via ORAL
  Filled 2012-10-27 (×2): qty 2

## 2012-10-27 MED ORDER — OXYCODONE-ACETAMINOPHEN 5-325 MG PO TABS: 1.0000 | ORAL_TABLET | Freq: Four times a day (QID) | ORAL | Status: DC | PRN

## 2012-10-27 MED ORDER — IBUPROFEN 800 MG PO TABS
800.0000 mg | ORAL_TABLET | Freq: Once | ORAL | Status: AC
  Administered 2012-10-27: 800 mg via ORAL
  Filled 2012-10-27: qty 1

## 2012-10-27 NOTE — ED Provider Notes (Signed)
History     CSN: [id number]  Arrival date & time 10/27/12  1127   First MD Initiated Contact with Patient 10/27/12 1143      Chief Complaint  Patient presents with  . Back Pain    (Consider location/radiation/quality/duration/timing/severity/associated sxs/prior treatment) HPI Pt reports sudden onset of moderate to severe L mid back pain, yesterday while sitting up. No known injury, pain is worse with movement. She was recently seen for GI virus but has not vomited in 2 days. Denies any fever, dysuria, hematuria or abdominal pain.   Past Medical History  Diagnosis Date  . Migraine     Past Surgical History  Procedure Date  . Cholecystectomy   . Rotator cuff repair     No family history on file.  History  Substance Use Topics  . Smoking status: Never Smoker   . Smokeless tobacco: Not on file  . Alcohol Use: No    OB History    Grav Para Term Preterm Abortions TAB SAB Ect Mult Living                  Review of Systems All other systems reviewed and are negative except as noted in HPI.   Allergies  Midazolam hcl  Home Medications   Current Outpatient Rx  Name  Route  Sig  Dispense  Refill  . ASPIRIN-ACETAMINOPHEN-CAFFEINE 250-250-65 MG PO TABS   Oral   Take 2 tablets by mouth every 6 (six) hours as needed. For migraines         . LOPERAMIDE HCL 2 MG PO TABS   Oral   Take 1 tablet (2 mg total) by mouth 4 (four) times daily as needed for diarrhea or loose stools.   30 tablet   0   . ONDANSETRON 4 MG PO TBDP   Oral   Take 1 tablet (4 mg total) by mouth every 8 (eight) hours as needed for nausea.   12 tablet   0   . POTASSIUM CHLORIDE CRYS ER 20 MEQ PO TBCR   Oral   Take 1 tablet (20 mEq total) by mouth 2 (two) times daily.   10 tablet   0   . PRENATAL 27-0.8 MG PO TABS   Oral   Take 1 tablet by mouth daily.         Marland Kitchen PROMETHAZINE HCL 25 MG PO TABS   Oral   Take 1 tablet (25 mg total) by mouth every 6 (six) hours as needed for nausea.  12 tablet   0     BP 171/84  Pulse 90  Temp 98.4 F (36.9 C) (Oral)  Resp 20  SpO2 100%  LMP 10/18/2012  Physical Exam  Nursing note and vitals reviewed. Constitutional: She is oriented to person, place, and time. She appears well-developed and well-nourished.  HENT:  Head: Normocephalic and atraumatic.  Eyes: EOM are normal. Pupils are equal, round, and reactive to light.  Neck: Normal range of motion. Neck supple.  Cardiovascular: Normal rate, normal heart sounds and intact distal pulses.   Pulmonary/Chest: Effort normal and breath sounds normal.  Abdominal: Bowel sounds are normal. She exhibits no distension. There is no tenderness.  Musculoskeletal: Normal range of motion. She exhibits tenderness (tender over the L throacic paraspinal soft tissues, no midline bony tenderness). She exhibits no edema.  Neurological: She is alert and oriented to person, place, and time. She has normal strength. No cranial nerve deficit or sensory deficit.  Skin: Skin is warm and dry.  No rash noted.  Psychiatric: She has a normal mood and affect.    ED Course  Procedures (including critical care time)   Labs Reviewed  URINALYSIS, ROUTINE W REFLEX MICROSCOPIC  PREGNANCY, URINE   No results found.   No diagnosis found.    MDM  UA neg. Pain improved with meds. Suspect muscle strain/spasm. Advised rest, heat, NSAID, pain meds as needed.         Charles B. Bernette Mayers, MD 10/27/12 1239

## 2012-10-27 NOTE — ED Notes (Signed)
Left thoracic pain since yesterday. Sudden onset. No injury. States she was seen here for gastroenteritis 2 days ago.

## 2012-11-03 NOTE — ED Notes (Signed)
Opened this chart to see if can reprint patients discharge instructions as her husband Chayanne Filippi has called here this morning stating they lost the original discharge papers and he wants to come and get more. Attempt made to explain that we can give the patient a copy of her medical record of visits to this facility and summary of visit if she will come by and sign a release form or we can fax her a release form. Mr Antosh is not satisfied by this answer and requests administrator transferred to Ophelia Shoulder voice mail .

## 2012-12-23 ENCOUNTER — Encounter (HOSPITAL_BASED_OUTPATIENT_CLINIC_OR_DEPARTMENT_OTHER): Payer: Self-pay | Admitting: Emergency Medicine

## 2012-12-23 ENCOUNTER — Emergency Department (HOSPITAL_BASED_OUTPATIENT_CLINIC_OR_DEPARTMENT_OTHER)
Admission: EM | Admit: 2012-12-23 | Discharge: 2012-12-24 | Disposition: A | Discharge status: Home / Self Care | Payer: 59 | Attending: Emergency Medicine | Admitting: Emergency Medicine

## 2012-12-23 DIAGNOSIS — Z79899 Other long term (current) drug therapy: Secondary | ICD-10-CM | POA: Insufficient documentation

## 2012-12-23 DIAGNOSIS — G43909 Migraine, unspecified, not intractable, without status migrainosus: Secondary | ICD-10-CM | POA: Insufficient documentation

## 2012-12-23 MED ORDER — DIPHENHYDRAMINE HCL 50 MG/ML IJ SOLN
25.0000 mg | Freq: Once | INTRAMUSCULAR | Status: AC
  Administered 2012-12-23: 25 mg via INTRAMUSCULAR
  Filled 2012-12-23: qty 1

## 2012-12-23 MED ORDER — METOCLOPRAMIDE HCL 5 MG/ML IJ SOLN
10.0000 mg | Freq: Once | INTRAMUSCULAR | Status: AC
  Administered 2012-12-23: 10 mg via INTRAMUSCULAR
  Filled 2012-12-23: qty 2

## 2012-12-23 MED ORDER — DEXAMETHASONE SODIUM PHOSPHATE 10 MG/ML IJ SOLN
10.0000 mg | Freq: Once | INTRAMUSCULAR | Status: AC
  Administered 2012-12-23: 10 mg via INTRAMUSCULAR
  Filled 2012-12-23: qty 1

## 2012-12-23 NOTE — ED Notes (Signed)
Pt stated that she has history of migraine and see's neurologist, neurologist prescribed immetrex as a trial medication for h/a, but medication does not work for her migraines, pt not very willing to participate in exam due to pain

## 2012-12-23 NOTE — ED Provider Notes (Signed)
History     CSN: [id number]  Arrival date & time 12/23/12  2053   First MD Initiated Contact with Patient 12/23/12 2301      Chief Complaint  Patient presents with  . Migraine    (Consider location/radiation/quality/duration/timing/severity/associated sxs/prior treatment) Patient is a 42 y.o. female presenting with migraines. The history is provided by the patient. No language interpreter was used.  Migraine This is a recurrent problem. The current episode started 6 to 12 hours ago. The problem occurs constantly. The problem has not changed since onset.Pertinent negatives include no chest pain, no abdominal pain and no shortness of breath. Nothing aggravates the symptoms. She has tried nothing for the symptoms. The treatment provided no relief.  Gets three-4 a month.  This is typical and located on the left side of the head.  No f/c/r. No neck pain or stiffness.  No weakness nor numbness.  Tried nothing for it  Past Medical History  Diagnosis Date  . Migraine     Past Surgical History  Procedure Laterality Date  . Cholecystectomy    . Rotator cuff repair      No family history on file.  History  Substance Use Topics  . Smoking status: Never Smoker   . Smokeless tobacco: Not on file  . Alcohol Use: No    OB History   Grav Para Term Preterm Abortions TAB SAB Ect Mult Living                  Review of Systems  Constitutional: Negative for fever.  HENT: Negative for neck pain and neck stiffness.   Eyes: Negative for photophobia.  Respiratory: Negative for shortness of breath.   Cardiovascular: Negative for chest pain.  Gastrointestinal: Negative for abdominal pain.  Skin: Negative for rash.  Neurological: Negative for facial asymmetry and speech difficulty.  All other systems reviewed and are negative.    Allergies  Midazolam hcl  Home Medications   Current Outpatient Rx  Name  Route  Sig  Dispense  Refill  . Prenatal Vit-Fe Fumarate-FA  (MULTIVITAMIN-PRENATAL) 27-0.8 MG TABS   Oral   Take 1 tablet by mouth daily.         . propranolol (INDERAL) 80 MG tablet   Oral   Take 80 mg by mouth daily.         Marland Kitchen aspirin-acetaminophen-caffeine (EXCEDRIN MIGRAINE) 250-250-65 MG per tablet   Oral   Take 2 tablets by mouth every 6 (six) hours as needed. For migraines         . ibuprofen (ADVIL,MOTRIN) 800 MG tablet   Oral   Take 1 tablet (800 mg total) by mouth every 8 (eight) hours as needed for pain.   30 tablet   0   . loperamide (IMODIUM A-D) 2 MG tablet   Oral   Take 1 tablet (2 mg total) by mouth 4 (four) times daily as needed for diarrhea or loose stools.   30 tablet   0   . ondansetron (ZOFRAN ODT) 4 MG disintegrating tablet   Oral   Take 1 tablet (4 mg total) by mouth every 8 (eight) hours as needed for nausea.   12 tablet   0   . oxyCODONE-acetaminophen (PERCOCET/ROXICET) 5-325 MG per tablet   Oral   Take 1-2 tablets by mouth every 6 (six) hours as needed for pain.   20 tablet   0   . potassium chloride SA (K-DUR,KLOR-CON) 20 MEQ tablet   Oral   Take 1  tablet (20 mEq total) by mouth 2 (two) times daily.   10 tablet   0   . promethazine (PHENERGAN) 25 MG tablet   Oral   Take 1 tablet (25 mg total) by mouth every 6 (six) hours as needed for nausea.   12 tablet   0     BP 138/93  Pulse 93  Temp(Src) 98.5 F (36.9 C) (Oral)  Resp 18  Ht 5\' 1"  (1.549 m)  Wt 150 lb (68.04 kg)  BMI 28.36 kg/m2  SpO2 99%  Physical Exam  Constitutional: She is oriented to person, place, and time. She appears well-developed and well-nourished. No distress.  HENT:  Head: Normocephalic and atraumatic.  Mouth/Throat: Oropharynx is clear and moist.  No temporal pain  Eyes: Conjunctivae and EOM are normal. Pupils are equal, round, and reactive to light.  Neck: Normal range of motion. Neck supple.  Cardiovascular: Normal rate, regular rhythm and intact distal pulses.   Pulmonary/Chest: Effort normal and breath  sounds normal. No stridor. She has no wheezes. She has no rales.  Abdominal: Soft. Bowel sounds are normal. There is no tenderness. There is no rebound and no guarding.  Musculoskeletal: Normal range of motion.  Lymphadenopathy:    She has no cervical adenopathy.  Neurological: She is alert and oriented to person, place, and time. She has normal reflexes. No cranial nerve deficit.  Skin: Skin is warm and dry.  Psychiatric: She has a normal mood and affect.    ED Course  Procedures (including critical care time)  Labs Reviewed - No data to display No results found.   No diagnosis found.    MDM  Typical migraine.  No indication for imaging at this time.  No changes in cognition.  Treat with migraine cocktail.  Patient felt improved post medication.  Return for any worsening symptoms       April K Palumbo-Rasch, MD 12/24/12 0140

## 2012-12-23 NOTE — ED Notes (Signed)
Pt c/o migraine since 2 pm today.

## 2013-01-05 ENCOUNTER — Emergency Department (HOSPITAL_BASED_OUTPATIENT_CLINIC_OR_DEPARTMENT_OTHER)
Admission: EM | Admit: 2013-01-05 | Discharge: 2013-01-05 | Disposition: A | Discharge status: Home / Self Care | Payer: 59 | Attending: Emergency Medicine | Admitting: Emergency Medicine

## 2013-01-05 ENCOUNTER — Encounter (HOSPITAL_BASED_OUTPATIENT_CLINIC_OR_DEPARTMENT_OTHER): Payer: Self-pay | Admitting: *Deleted

## 2013-01-05 DIAGNOSIS — R6883 Chills (without fever): Secondary | ICD-10-CM | POA: Insufficient documentation

## 2013-01-05 DIAGNOSIS — Z79899 Other long term (current) drug therapy: Secondary | ICD-10-CM | POA: Insufficient documentation

## 2013-01-05 DIAGNOSIS — R197 Diarrhea, unspecified: Secondary | ICD-10-CM | POA: Insufficient documentation

## 2013-01-05 DIAGNOSIS — Z3202 Encounter for pregnancy test, result negative: Secondary | ICD-10-CM | POA: Insufficient documentation

## 2013-01-05 DIAGNOSIS — R1084 Generalized abdominal pain: Secondary | ICD-10-CM | POA: Insufficient documentation

## 2013-01-05 DIAGNOSIS — Z9089 Acquired absence of other organs: Secondary | ICD-10-CM | POA: Insufficient documentation

## 2013-01-05 DIAGNOSIS — E876 Hypokalemia: Secondary | ICD-10-CM | POA: Insufficient documentation

## 2013-01-05 DIAGNOSIS — G43909 Migraine, unspecified, not intractable, without status migrainosus: Secondary | ICD-10-CM | POA: Insufficient documentation

## 2013-01-05 DIAGNOSIS — R109 Unspecified abdominal pain: Secondary | ICD-10-CM

## 2013-01-05 LAB — CBC WITH DIFFERENTIAL/PLATELET
Basophils Absolute: 0 10*3/uL (ref 0.0–0.1)
Basophils Relative: 0 % (ref 0–1)
Eosinophils Absolute: 0.2 10*3/uL (ref 0.0–0.7)
Eosinophils Relative: 4 % (ref 0–5)
HCT: 41.1 % (ref 36.0–46.0)
Hemoglobin: 14.6 g/dL (ref 12.0–15.0)
Lymphocytes Relative: 29 % (ref 12–46)
Lymphs Abs: 1.7 10*3/uL (ref 0.7–4.0)
MCH: 29.5 pg (ref 26.0–34.0)
MCHC: 35.5 g/dL (ref 30.0–36.0)
MCV: 83 fL (ref 78.0–100.0)
Monocytes Absolute: 0.5 10*3/uL (ref 0.1–1.0)
Monocytes Relative: 8 % (ref 3–12)
Neutro Abs: 3.4 10*3/uL (ref 1.7–7.7)
Neutrophils Relative %: 59 % (ref 43–77)
Platelets: 223 10*3/uL (ref 150–400)
RBC: 4.95 MIL/uL (ref 3.87–5.11)
RDW: 13.3 % (ref 11.5–15.5)
WBC: 5.8 10*3/uL (ref 4.0–10.5)

## 2013-01-05 LAB — COMPREHENSIVE METABOLIC PANEL
ALT: 23 U/L (ref 0–35)
AST: 22 U/L (ref 0–37)
Albumin: 4.2 g/dL (ref 3.5–5.2)
Alkaline Phosphatase: 89 U/L (ref 39–117)
BUN: 11 mg/dL (ref 6–23)
CO2: 24 mEq/L (ref 19–32)
Calcium: 9.3 mg/dL (ref 8.4–10.5)
Chloride: 103 mEq/L (ref 96–112)
Creatinine, Ser: 0.7 mg/dL (ref 0.50–1.10)
GFR calc Af Amer: >90 mL/min (ref >90)
GFR calc non Af Amer: >90 mL/min (ref >90)
Glucose, Bld: 93 mg/dL (ref 70–99)
Potassium: 3.1 mEq/L — ABNORMAL LOW (ref 3.5–5.1)
Sodium: 139 mEq/L (ref 135–145)
Total Bilirubin: 1.2 mg/dL (ref 0.3–1.2)
Total Protein: 7.5 g/dL (ref 6.0–8.3)

## 2013-01-05 LAB — URINALYSIS, ROUTINE W REFLEX MICROSCOPIC
Ketones, ur: 15 mg/dL — AB
Nitrite: NEGATIVE
Specific Gravity, Urine: 1.029 (ref 1.005–1.030)
pH: 6 (ref 5.0–8.0)

## 2013-01-05 LAB — LIPASE, BLOOD: Lipase: 14 U/L (ref 11–59)

## 2013-01-05 LAB — URINE MICROSCOPIC-ADD ON

## 2013-01-05 MED ORDER — POTASSIUM CHLORIDE CRYS ER 20 MEQ PO TBCR
40.0000 meq | EXTENDED_RELEASE_TABLET | Freq: Once | ORAL | Status: AC
  Administered 2013-01-05: 40 meq via ORAL
  Filled 2013-01-05: qty 2

## 2013-01-05 MED ORDER — KETOROLAC TROMETHAMINE 30 MG/ML IJ SOLN
30.0000 mg | Freq: Once | INTRAMUSCULAR | Status: AC
  Administered 2013-01-05: 30 mg via INTRAVENOUS
  Filled 2013-01-05: qty 1

## 2013-01-05 MED ORDER — SODIUM CHLORIDE 0.9 % IV SOLN
Freq: Once | INTRAVENOUS | Status: AC
  Administered 2013-01-05: 21:00:00 via INTRAVENOUS

## 2013-01-05 NOTE — ED Provider Notes (Signed)
History     CSN: [id number]  Arrival date & time 01/05/13  2042   First MD Initiated Contact with Patient 01/05/13 2057      Chief Complaint  Patient presents with  . Abdominal Pain    (Consider location/radiation/quality/duration/timing/severity/associated sxs/prior treatment) Patient is a 42 y.o. female presenting with abdominal pain. The history is provided by the patient. No language interpreter was used.  Abdominal Pain Pain location:  Generalized Pain quality: cramping   Pain radiates to:  Does not radiate Pain severity:  Moderate Onset quality:  Gradual Timing:  Constant Progression:  Unchanged Chronicity:  New Relieved by:  Nothing Worsened by:  Eating Ineffective treatments:  None tried Associated symptoms: chills and diarrhea   Associated symptoms: no dysuria, no fever, no melena, no nausea and no vomiting   Pt awoke with severe abd pain this am, has had liquid diarrhea stools every . Denies n/v. No PO intake stating it goes right through her.  Took Tylenol are 1900, thought she had a fever. Abd pain generalized and cramping.  Nothing makes it better. Denies similar previous hx of similar pain. Denies recent travel or sick contacts.  Past Medical History  Diagnosis Date  . Migraine     Past Surgical History  Procedure Laterality Date  . Cholecystectomy    . Rotator cuff repair      No family history on file.  History  Substance Use Topics  . Smoking status: Never Smoker   . Smokeless tobacco: Not on file  . Alcohol Use: No    OB History   Grav Para Term Preterm Abortions TAB SAB Ect Mult Living                  Review of Systems  Constitutional: Positive for chills. Negative for fever.  Gastrointestinal: Positive for abdominal pain and diarrhea. Negative for nausea, vomiting and melena.  Genitourinary: Negative for dysuria.    Allergies  Midazolam hcl  Home Medications   Current Outpatient Rx  Name  Route  Sig  Dispense  Refill  .  aspirin-acetaminophen-caffeine (EXCEDRIN MIGRAINE) 250-250-65 MG per tablet   Oral   Take 2 tablets by mouth every 6 (six) hours as needed. For migraines         . ibuprofen (ADVIL,MOTRIN) 800 MG tablet   Oral   Take 1 tablet (800 mg total) by mouth every 8 (eight) hours as needed for pain.   30 tablet   0   . loperamide (IMODIUM A-D) 2 MG tablet   Oral   Take 1 tablet (2 mg total) by mouth 4 (four) times daily as needed for diarrhea or loose stools.   30 tablet   0   . ondansetron (ZOFRAN ODT) 4 MG disintegrating tablet   Oral   Take 1 tablet (4 mg total) by mouth every 8 (eight) hours as needed for nausea.   12 tablet   0   . oxyCODONE-acetaminophen (PERCOCET/ROXICET) 5-325 MG per tablet   Oral   Take 1-2 tablets by mouth every 6 (six) hours as needed for pain.   20 tablet   0   . potassium chloride SA (K-DUR,KLOR-CON) 20 MEQ tablet   Oral   Take 1 tablet (20 mEq total) by mouth 2 (two) times daily.   10 tablet   0   . Prenatal Vit-Fe Fumarate-FA (MULTIVITAMIN-PRENATAL) 27-0.8 MG TABS   Oral   Take 1 tablet by mouth daily.         Marland Kitchen  promethazine (PHENERGAN) 25 MG tablet   Oral   Take 1 tablet (25 mg total) by mouth every 6 (six) hours as needed for nausea.   12 tablet   0   . propranolol (INDERAL) 80 MG tablet   Oral   Take 80 mg by mouth daily.           BP 120/90  Pulse 100  Temp(Src) 99.5 F (37.5 C) (Oral)  Resp 22  Wt 150 lb (68.04 kg)  BMI 28.36 kg/m2  SpO2 100%  LMP 11/17/2012  Physical Exam  Nursing note and vitals reviewed. Constitutional: She appears well-developed and well-nourished.  HENT:  Head: Normocephalic and atraumatic.  Eyes: Conjunctivae are normal.  Neck: Normal range of motion. Neck supple.  Cardiovascular: Normal rate, regular rhythm and normal heart sounds.   Pulmonary/Chest: Breath sounds normal. No respiratory distress. She has no wheezes.  Abdominal: Soft. Bowel sounds are normal. She exhibits no distension and no  mass. There is tenderness ( throughout all quadrants  No Rebound tenderness. No localized tenderness. No mcmurrays sign). There is no rebound and no guarding.  Musculoskeletal: Normal range of motion.  Neurological: She is alert.  Skin: Skin is warm and dry. No rash noted.    ED Course  Procedures (including critical care time)  Labs Reviewed  URINALYSIS, ROUTINE W REFLEX MICROSCOPIC - Abnormal; Notable for the following:    Color, Urine AMBER (*)    APPearance CLOUDY (*)    Bilirubin Urine SMALL (*)    Ketones, ur 15 (*)    Protein, ur 30 (*)    Leukocytes, UA SMALL (*)    All other components within normal limits  URINE MICROSCOPIC-ADD ON - Abnormal; Notable for the following:    Squamous Epithelial / LPF FEW (*)    All other components within normal limits  COMPREHENSIVE METABOLIC PANEL - Abnormal; Notable for the following:    Potassium 3.1 (*)    All other components within normal limits  PREGNANCY, URINE  CBC WITH DIFFERENTIAL  LIPASE, BLOOD   No results found.   1. Abdominal pain   2. Diarrhea   3. Hypokalemia       MDM  Pt states she has had diarrhea all day. Unable to eat because it goes right through her. Has been drinking fluids.  Denies fever, nausea, or vomiting. No recent travel or known sick contacts.  Surgical hx-significant for cholecystectomy several years ago.  Currently taking prenatal vitamins, but not currently pregnant. Due for menstrual cycle to begin any day.  Diffuse, cramping abdominal pain. Took Tylenol around 1700, pt believed she had a fever.  Temp: 99.5 on arrival.    Labs:  UA-significant for leukocytosis but w/o urinary symptoms, no need to tx at this time. CBC-nl CMP-sig for hypokalemia. Will give 1 dose of K-Dur.    Will discharge pt home. May take OTC imodium.  Important to stay hydrated.   Likely viral in nature.  Need to let diarrhea run its course.  Most important for pt to stay hydrated.  Good hand hygiene.  B.R.A.T diet may  help.  Follow up with PCP or urgent care as needed.  Return to ED if unable to stay hydrated, uncontrolled fever, or abdominal pain becomes severe and localized.  Vitals: unremarkable. Discharged in stable condition.    Discussed pt with attending during ED encounter.         Junius Finner, PA-C 01/05/13 2210

## 2013-01-05 NOTE — ED Notes (Signed)
Pt reports that she awoke with severe abd pain this am, has had liquid diarrhea stools every 20 min, denies n/v, no po intake today secondary she is scare it will make her sicker, took tylenol aroun 1900 because she thought she had fever

## 2013-01-05 NOTE — ED Provider Notes (Signed)
Medical screening examination/treatment/procedure(s) were performed by non-physician practitioner and as supervising physician I was immediately available for consultation/collaboration.   Zameria Vogl H Sriansh Farra, MD 01/05/13 2323 

## 2013-01-05 NOTE — ED Notes (Signed)
Abdominal cramps. Diarrhea. Headache and legs ache. Symptoms since this am.

## 2013-03-20 ENCOUNTER — Emergency Department (HOSPITAL_BASED_OUTPATIENT_CLINIC_OR_DEPARTMENT_OTHER)
Admission: EM | Admit: 2013-03-20 | Discharge: 2013-03-21 | Disposition: A | Discharge status: Home / Self Care | Payer: 59 | Attending: Emergency Medicine | Admitting: Emergency Medicine

## 2013-03-20 ENCOUNTER — Emergency Department (HOSPITAL_BASED_OUTPATIENT_CLINIC_OR_DEPARTMENT_OTHER): Payer: 59

## 2013-03-20 ENCOUNTER — Encounter (HOSPITAL_BASED_OUTPATIENT_CLINIC_OR_DEPARTMENT_OTHER): Payer: Self-pay | Admitting: *Deleted

## 2013-03-20 DIAGNOSIS — S93609A Unspecified sprain of unspecified foot, initial encounter: Secondary | ICD-10-CM | POA: Insufficient documentation

## 2013-03-20 DIAGNOSIS — X500XXA Overexertion from strenuous movement or load, initial encounter: Secondary | ICD-10-CM | POA: Insufficient documentation

## 2013-03-20 DIAGNOSIS — Z79899 Other long term (current) drug therapy: Secondary | ICD-10-CM | POA: Insufficient documentation

## 2013-03-20 DIAGNOSIS — Y9289 Other specified places as the place of occurrence of the external cause: Secondary | ICD-10-CM | POA: Insufficient documentation

## 2013-03-20 DIAGNOSIS — G43909 Migraine, unspecified, not intractable, without status migrainosus: Secondary | ICD-10-CM | POA: Insufficient documentation

## 2013-03-20 DIAGNOSIS — Y93E5 Activity, floor mopping and cleaning: Secondary | ICD-10-CM | POA: Insufficient documentation

## 2013-03-20 DIAGNOSIS — S93501A Unspecified sprain of right great toe, initial encounter: Secondary | ICD-10-CM

## 2013-03-20 NOTE — ED Notes (Signed)
MD at bedside. 

## 2013-03-20 NOTE — ED Notes (Signed)
Pt c/o right toe injury. States she "twisted" her right great toe earlier today. No obvious deformity noted on exam.

## 2013-03-20 NOTE — ED Provider Notes (Signed)
History  This chart was scribed for Hanley Seamen, MD by Ardelia Mems, ED Scribe. This patient was seen in room MH02/MH02 and the patient's care was started at 11:43 PM.   CSN: [id number]  Arrival date & time 03/20/13  2235     Chief Complaint  Patient presents with  . Toe Injury     The history is provided by the patient. No language interpreter was used.    HPI Comments: [name] is a 42 y.o. female who presents to the Emergency Department complaining of constant, moderate right great toe pain onset earlier today. Pt states that she was on the floor cleaning this afternoon, and that when she tried to get up she twisted her right big toe earlier this afternoon. Pt states that pain is exacerbated by walking and bending of the toe.  Past Medical History  Diagnosis Date  . Migraine   . Migraine     Past Surgical History  Procedure Laterality Date  . Cholecystectomy    . Rotator cuff repair      No family history on file.  History  Substance Use Topics  . Smoking status: Never Smoker   . Smokeless tobacco: Not on file  . Alcohol Use: No    OB History   Grav Para Term Preterm Abortions TAB SAB Ect Mult Living                  Review of Systems  Unable to perform ROS Constitutional: Negative for fever and chills.  HENT: Negative for neck pain.   Respiratory: Negative for shortness of breath.   Cardiovascular: Negative for chest pain.  Gastrointestinal: Negative for nausea, vomiting, abdominal pain and diarrhea.  Musculoskeletal: Negative for back pain.  Skin: Negative for rash.  All other systems reviewed and are negative.    Allergies  Midazolam hcl  Home Medications   Current Outpatient Rx  Name  Route  Sig  Dispense  Refill  . cyclobenzaprine (FLEXERIL) 5 MG tablet   Oral   Take 5 mg by mouth 3 (three) times daily as needed for muscle spasms.         . Prenatal Vit-Fe Fumarate-FA (PRENATAL MULTIVITAMIN) TABS   Oral   Take 1 tablet by  mouth daily at 12 noon.                      Marland Kitchen UNABLE TO FIND      B/p med for migraine but not sure of the name         . aspirin-acetaminophen-caffeine (EXCEDRIN MIGRAINE) 250-250-65 MG per tablet   Oral   Take 2 tablets by mouth every 6 (six) hours as needed. For migraines         . ibuprofen (ADVIL,MOTRIN) 800 MG tablet   Oral   Take 1 tablet (800 mg total) by mouth every 8 (eight) hours as needed for pain.   30 tablet   0   . loperamide (IMODIUM A-D) 2 MG tablet   Oral   Take 1 tablet (2 mg total) by mouth 4 (four) times daily as needed for diarrhea or loose stools.   30 tablet   0   . ondansetron (ZOFRAN ODT) 4 MG disintegrating tablet   Oral   Take 1 tablet (4 mg total) by mouth every 8 (eight) hours as needed for nausea.   12 tablet   0   . oxyCODONE-acetaminophen (PERCOCET/ROXICET) 5-325 MG per tablet   Oral  Take 1-2 tablets by mouth every 6 (six) hours as needed for pain.   20 tablet   0   . potassium chloride SA (K-DUR,KLOR-CON) 20 MEQ tablet   Oral   Take 1 tablet (20 mEq total) by mouth 2 (two) times daily.   10 tablet   0   . Prenatal Vit-Fe Fumarate-FA (MULTIVITAMIN-PRENATAL) 27-0.8 MG TABS   Oral   Take 1 tablet by mouth daily.         . promethazine (PHENERGAN) 25 MG tablet   Oral   Take 1 tablet (25 mg total) by mouth every 6 (six) hours as needed for nausea.   12 tablet   0   . propranolol (INDERAL) 80 MG tablet   Oral   Take 80 mg by mouth daily.           Triage Vitals: BP 142/96  Pulse 87  Temp(Src) 98.5 F (36.9 C) (Oral)  Resp 18  Ht 5\' 1"  (1.549 m)  Wt 150 lb (68.04 kg)  BMI 28.36 kg/m2  SpO2 97%  LMP 03/10/2013  Physical Exam  Nursing note and vitals reviewed. Constitutional: She is oriented to person, place, and time. She appears well-developed and well-nourished.  HENT:  Head: Normocephalic.  Eyes: EOM are normal. Pupils are equal, round, and reactive to light.  Neck: Normal range of motion. Neck  supple. No tracheal deviation present.  Neck is non-tender.   Cardiovascular: Normal rate, regular rhythm and normal heart sounds.   No murmur heard. Pulmonary/Chest: Effort normal. No respiratory distress.  Abdominal: Soft. Bowel sounds are normal. There is no tenderness.  Musculoskeletal:  Right great toe is tender distally. No tenderness at the MTP joint. Right ankle is benign. Left ankle is benign.   Neurological: She is alert and oriented to person, place, and time.  Skin: Skin is warm. No rash noted.  Psychiatric: She has a normal mood and affect.    ED Course  Procedures (including critical care time)  DIAGNOSTIC STUDIES: Oxygen Saturation is 97% on RA, normal  by my interpretation.    COORDINATION OF CARE: 11:47 PM- Pt advised of plan for treatment and pt agrees.     MDM  Nursing notes and vitals signs, including pulse oximetry, reviewed.  Summary of this visit's results, reviewed by myself:  Imaging Studies: Dg Toe Great Right  03/20/2013   *RADIOLOGY REPORT*  Clinical Data: Right great toe injury and pain.  RIGHT GREAT TOE - 3 view  Comparison: None  Findings: No evidence of fracture or dislocation.  No evidence of arthropathy or other significant bone abnormality. Soft tissues are unremarkable.  IMPRESSION: Negative.   Original Report Authenticated By: Myles Rosenthal, M.D.   Will provide post-op shoe for comfort.            I personally performed the services described in this documentation, which was scribed in my presence.  The recorded information has been reviewed and is accurate.    Hanley Seamen, MD 03/21/13 [phone]

## 2013-03-20 NOTE — ED Notes (Signed)
Pt transported to X-ray via wheelchair.

## 2013-03-21 MED ORDER — NAPROXEN 250 MG PO TABS
500.0000 mg | ORAL_TABLET | Freq: Once | ORAL | Status: AC
  Administered 2013-03-21: 500 mg via ORAL
  Filled 2013-03-21: qty 2

## 2013-03-21 NOTE — ED Notes (Signed)
MD at bedside. 

## 2013-06-10 ENCOUNTER — Emergency Department (HOSPITAL_BASED_OUTPATIENT_CLINIC_OR_DEPARTMENT_OTHER)
Admission: EM | Admit: 2013-06-10 | Discharge: 2013-06-10 | Disposition: A | Discharge status: Home / Self Care | Payer: 59 | Attending: Emergency Medicine | Admitting: Emergency Medicine

## 2013-06-10 DIAGNOSIS — N390 Urinary tract infection, site not specified: Secondary | ICD-10-CM

## 2013-06-10 DIAGNOSIS — Z8679 Personal history of other diseases of the circulatory system: Secondary | ICD-10-CM | POA: Insufficient documentation

## 2013-06-10 DIAGNOSIS — R319 Hematuria, unspecified: Secondary | ICD-10-CM | POA: Insufficient documentation

## 2013-06-10 DIAGNOSIS — Z3202 Encounter for pregnancy test, result negative: Secondary | ICD-10-CM | POA: Insufficient documentation

## 2013-06-10 LAB — URINALYSIS, ROUTINE W REFLEX MICROSCOPIC
Ketones, ur: NEGATIVE mg/dL
Protein, ur: >300 mg/dL — AB
Urobilinogen, UA: 0.2 mg/dL (ref 0.0–1.0)

## 2013-06-10 LAB — PREGNANCY, URINE: Preg Test, Ur: NEGATIVE

## 2013-06-10 LAB — URINE MICROSCOPIC-ADD ON

## 2013-06-10 MED ORDER — HYDROCODONE-ACETAMINOPHEN 5-325 MG PO TABS: 2.0000 | ORAL_TABLET | ORAL | Status: DC | PRN

## 2013-06-10 MED ORDER — CEPHALEXIN 500 MG PO CAPS: 500.0000 mg | ORAL_CAPSULE | Freq: Four times a day (QID) | ORAL | Status: DC

## 2013-06-10 MED ORDER — PHENAZOPYRIDINE HCL 200 MG PO TABS: 200.0000 mg | ORAL_TABLET | Freq: Three times a day (TID) | ORAL | Status: DC

## 2013-06-10 NOTE — ED Provider Notes (Signed)
  CSN: [id number]     Arrival date & time 06/10/13  1637 History     First MD Initiated Contact with Patient 06/10/13 1646     Chief Complaint  Patient presents with  . Dysuria  . Hematuria   (Consider location/radiation/quality/duration/timing/severity/associated sxs/prior Treatment) Patient is a 42 y.o. female presenting with dysuria and hematuria. The history is provided by the patient. No language interpreter was used.  Dysuria Pain quality:  Aching Pain severity:  No pain Onset quality:  Sudden Duration:  1 day Timing:  Constant Progression:  Worsening Chronicity:  New Relieved by:  Nothing Worsened by:  Nothing tried Urinary symptoms: frequent urination and hematuria   Risk factors: recurrent urinary tract infections   Hematuria    Past Medical History  Diagnosis Date  . Migraine   . Migraine    Past Surgical History  Procedure Laterality Date  . Cholecystectomy    . Rotator cuff repair     No family history on file. History  Substance Use Topics  . Smoking status: Never Smoker   . Smokeless tobacco: Not on file  . Alcohol Use: No   OB History   Grav Para Term Preterm Abortions TAB SAB Ect Mult Living                 Review of Systems  Genitourinary: Positive for dysuria and hematuria.  All other systems reviewed and are negative.    Allergies  Midazolam hcl  Home Medications    BP 137/94  Pulse 98  Temp(Src) 98.4 F (36.9 C) (Oral)  Resp 18  Ht 5\' 1"  (1.549 m)  Wt 140 lb (63.504 kg)  BMI 26.47 kg/m2  SpO2 100%  LMP 05/24/2013 Physical Exam  Vitals reviewed. Constitutional: She is oriented to person, place, and time. She appears well-developed and well-nourished.  HENT:  Head: Normocephalic and atraumatic.  Eyes: Pupils are equal, round, and reactive to light.  Neck: Normal range of motion.  Cardiovascular: Normal rate and normal heart sounds.   Pulmonary/Chest: Effort normal.  Abdominal: Soft. There is tenderness.   Musculoskeletal: Normal range of motion.  Neurological: She is alert and oriented to person, place, and time. She has normal reflexes.  Skin: Skin is warm.  Psychiatric: She has a normal mood and affect.    ED Course   Procedures (including critical care time)  Labs Reviewed  URINALYSIS, ROUTINE W REFLEX MICROSCOPIC - Abnormal; Notable for the following:    Color, Urine AMBER (*)    APPearance CLOUDY (*)    Hgb urine dipstick LARGE (*)    Protein, ur >300 (*)    Leukocytes, UA LARGE (*)    All other components within normal limits  URINE MICROSCOPIC-ADD ON - Abnormal; Notable for the following:    Squamous Epithelial / LPF FEW (*)    Bacteria, UA FEW (*)    All other components within normal limits  URINE CULTURE  PREGNANCY, URINE   No results found. 1. UTI (lower urinary tract infection)     MDM  rx for keflex, pyridium and hydrocodone  Elson Areas, PA-C 06/10/13 1739

## 2013-06-10 NOTE — ED Notes (Signed)
Pt amb to triage with quick steady gait in nad. Pt reports dysuria x yesterday, her usual uti sx.

## 2013-06-10 NOTE — ED Provider Notes (Signed)
History/physical exam/procedure(s) were performed by non-physician practitioner and as supervising physician I was immediately available for consultation/collaboration. I have reviewed all notes and am in agreement with care and plan.   Hilario Quarry, MD 06/10/13 [phone]

## 2013-06-11 LAB — URINE CULTURE

## 2013-09-13 ENCOUNTER — Emergency Department (HOSPITAL_BASED_OUTPATIENT_CLINIC_OR_DEPARTMENT_OTHER)
Admission: EM | Admit: 2013-09-13 | Discharge: 2013-09-13 | Discharge status: Against Medical Advice | Payer: 59 | Attending: Emergency Medicine | Admitting: Emergency Medicine

## 2013-09-13 DIAGNOSIS — J029 Acute pharyngitis, unspecified: Secondary | ICD-10-CM | POA: Insufficient documentation

## 2013-09-13 NOTE — ED Notes (Signed)
Pt. Reports sore throat started on Friday.  Pt. Has no resp. Distress and no drooling noted.  Pt. Reports it hurts to swallow.

## 2013-09-14 ENCOUNTER — Encounter (HOSPITAL_BASED_OUTPATIENT_CLINIC_OR_DEPARTMENT_OTHER): Payer: Self-pay | Admitting: Emergency Medicine

## 2013-09-14 ENCOUNTER — Emergency Department (HOSPITAL_BASED_OUTPATIENT_CLINIC_OR_DEPARTMENT_OTHER)
Admission: EM | Admit: 2013-09-14 | Discharge: 2013-09-14 | Disposition: A | Discharge status: Home / Self Care | Payer: 59 | Attending: Emergency Medicine | Admitting: Emergency Medicine

## 2013-09-14 DIAGNOSIS — J029 Acute pharyngitis, unspecified: Secondary | ICD-10-CM | POA: Insufficient documentation

## 2013-09-14 DIAGNOSIS — Z8679 Personal history of other diseases of the circulatory system: Secondary | ICD-10-CM | POA: Insufficient documentation

## 2013-09-14 LAB — RAPID STREP SCREEN (MED CTR MEBANE ONLY): Streptococcus, Group A Screen (Direct): NEGATIVE

## 2013-09-14 MED ORDER — HYDROCODONE-ACETAMINOPHEN 5-325 MG PO TABS
2.0000 | ORAL_TABLET | Freq: Once | ORAL | Status: AC
  Administered 2013-09-14: 2 via ORAL
  Filled 2013-09-14: qty 2

## 2013-09-14 MED ORDER — HYDROCODONE-ACETAMINOPHEN 5-325 MG PO TABS: 1.0000 | ORAL_TABLET | Freq: Four times a day (QID) | ORAL | Status: DC | PRN

## 2013-09-14 NOTE — ED Notes (Signed)
MD at bedside. 

## 2013-09-14 NOTE — ED Provider Notes (Signed)
CSN: [id number]     Arrival date & time 09/14/13  2103 History  This chart was scribed for Doug Sou, MD by Blanchard Kelch, ED Scribe. The patient was seen in room MH12/MH12. Patient's care was started at 9:46 PM.      Chief Complaint  Patient presents with  . Sore Throat    Patient is a 42 y.o. female presenting with pharyngitis. The history is provided by the patient. No language interpreter was used.  Sore Throat This is a new problem. The current episode started more than 2 days ago. The problem occurs constantly. The problem has been gradually worsening. Pertinent negatives include no abdominal pain. Nothing relieves the symptoms.    HPI Comments: [name] is a 42 y.o. female who presents to the Emergency Department complaining of constant, worsening sore throat that began four days ago. She complains of associated pain with swallowing and cough that began today. She denies fever, abdominal pain or difficulty swallowing. She has tried Theraflu and Nyquil Cold and Flu without relief. She has a past medical history of migraines. She is currently taking pre natal vitamins because she is trying to get pregnant. She is currently menstruating. She denies smoking, alcohol or illegal drug use.   Her OB-GYN is Dr. Shawnie Pons.  Past Medical History  Diagnosis Date  . Migraine   . Migraine    Past Surgical History  Procedure Laterality Date  . Cholecystectomy    . Rotator cuff repair     No family history on file. History  Substance Use Topics  . Smoking status: Never Smoker   . Smokeless tobacco: Not on file  . Alcohol Use: No   OB History   Grav Para Term Preterm Abortions TAB SAB Ect Mult Living                 Review of Systems  Constitutional: Negative.   HENT: Positive for sore throat. Negative for trouble swallowing.   Respiratory: Positive for cough.   Cardiovascular: Negative.   Gastrointestinal: Negative.  Negative for abdominal pain.  Genitourinary:        Currently on menses  Musculoskeletal: Negative.   Skin: Negative.   Neurological: Negative.   Hematological: Negative.   Psychiatric/Behavioral: Negative.   All other systems reviewed and are negative.    Allergies  Midazolam hcl  Home Medications    Triage Vitals: BP 144/99  Pulse 94  Temp(Src) 98.8 F (37.1 C) (Oral)  Resp 18  Ht 5\' 1"  (1.549 m)  Wt 140 lb (63.504 kg)  BMI 26.47 kg/m2  SpO2 99%  LMP 09/13/2013  Physical Exam  Nursing note and vitals reviewed. Constitutional: She appears well-developed and well-nourished.  HENT:  Head: Normocephalic and atraumatic. Not macrocephalic.  Mouth/Throat: Uvula is midline. No oropharyngeal exudate.  Mild pharyngeal erythema. Uvula midline. No exudate.  Eyes: Conjunctivae are normal. Pupils are equal, round, and reactive to light.  Neck: Neck supple. No tracheal deviation present. No thyromegaly present.  No pain on nebulization of the thyroid cartilage.  Cardiovascular: Normal rate and regular rhythm.   No murmur heard. Pulmonary/Chest: Effort normal and breath sounds normal.  Abdominal: Soft. Bowel sounds are normal. She exhibits no distension. There is no tenderness.  Musculoskeletal: Normal range of motion. She exhibits no edema and no tenderness.  Lymphadenopathy:    She has no cervical adenopathy.  Neurological: She is alert. Coordination normal.  Skin: Skin is warm and dry. No rash noted.  Psychiatric: She has a normal  mood and affect.    ED Course  Procedures (including critical care time)  DIAGNOSTIC STUDIES: Oxygen Saturation is 99% on room air, normal by my interpretation.    COORDINATION OF CARE: 9:50 PM -Clinical suspicion of viral infection.  Patient verbalizes understanding and agrees with treatment plan.    Labs Review Labs Reviewed  RAPID STREP SCREEN  CULTURE, GROUP A STREP   Imaging Review No results found.  EKG Interpretation   None      Results for orders placed during the  hospital encounter of 09/14/13  RAPID STREP SCREEN      Result Value Range   Streptococcus, Group A Screen (Direct) NEGATIVE  NEGATIVE   No results found.   MDM  No diagnosis found. Symptoms likely viral in etiology. Plan prescription Norco. Followup Dr. Shawnie Pons or return if significant pain or not improving by next week. Blood pressure recheck within 3 weeks Diagnosis#1 pharyngitis  #2 elevated blood pressure   I personally performed the services described in this documentation, which was scribed in my presence. The recorded information has been reviewed and considered.   Doug Sou, MD 09/14/13 2158

## 2013-09-14 NOTE — ED Notes (Signed)
Pt. Reports sore throat same as yesterday. Pt. was here yesterday and left before being seen.

## 2013-09-15 LAB — CULTURE, GROUP A STREP

## 2013-11-04 ENCOUNTER — Encounter (HOSPITAL_BASED_OUTPATIENT_CLINIC_OR_DEPARTMENT_OTHER): Payer: Self-pay | Admitting: Emergency Medicine

## 2013-11-04 ENCOUNTER — Emergency Department (HOSPITAL_BASED_OUTPATIENT_CLINIC_OR_DEPARTMENT_OTHER)
Admission: EM | Admit: 2013-11-04 | Discharge: 2013-11-04 | Disposition: A | Discharge status: Home / Self Care | Payer: 59 | Attending: Emergency Medicine | Admitting: Emergency Medicine

## 2013-11-04 DIAGNOSIS — Z3202 Encounter for pregnancy test, result negative: Secondary | ICD-10-CM | POA: Diagnosis not present

## 2013-11-04 DIAGNOSIS — Z792 Long term (current) use of antibiotics: Secondary | ICD-10-CM | POA: Insufficient documentation

## 2013-11-04 DIAGNOSIS — Z79899 Other long term (current) drug therapy: Secondary | ICD-10-CM | POA: Insufficient documentation

## 2013-11-04 DIAGNOSIS — R197 Diarrhea, unspecified: Secondary | ICD-10-CM | POA: Diagnosis present

## 2013-11-04 DIAGNOSIS — R109 Unspecified abdominal pain: Secondary | ICD-10-CM

## 2013-11-04 DIAGNOSIS — R52 Pain, unspecified: Secondary | ICD-10-CM | POA: Insufficient documentation

## 2013-11-04 DIAGNOSIS — G43909 Migraine, unspecified, not intractable, without status migrainosus: Secondary | ICD-10-CM | POA: Insufficient documentation

## 2013-11-04 DIAGNOSIS — R1013 Epigastric pain: Secondary | ICD-10-CM | POA: Diagnosis not present

## 2013-11-04 DIAGNOSIS — R519 Headache, unspecified: Secondary | ICD-10-CM

## 2013-11-04 DIAGNOSIS — R51 Headache: Secondary | ICD-10-CM

## 2013-11-04 LAB — COMPREHENSIVE METABOLIC PANEL
ALT: 35 U/L (ref 0–35)
AST: 27 U/L (ref 0–37)
Albumin: 4.3 g/dL (ref 3.5–5.2)
Alkaline Phosphatase: 91 U/L (ref 39–117)
BUN: 14 mg/dL (ref 6–23)
CALCIUM: 9.2 mg/dL (ref 8.4–10.5)
CO2: 24 meq/L (ref 19–32)
Chloride: 99 mEq/L (ref 96–112)
Creatinine, Ser: 0.7 mg/dL (ref 0.50–1.10)
GLUCOSE: 101 mg/dL — AB (ref 70–99)
Potassium: 3.8 mEq/L (ref 3.7–5.3)
Sodium: 138 mEq/L (ref 137–147)
Total Bilirubin: 1.1 mg/dL (ref 0.3–1.2)
Total Protein: 7.7 g/dL (ref 6.0–8.3)

## 2013-11-04 LAB — LIPASE, BLOOD: Lipase: 14 U/L (ref 11–59)

## 2013-11-04 LAB — CBC WITH DIFFERENTIAL/PLATELET
Basophils Absolute: 0 10*3/uL (ref 0.0–0.1)
Basophils Relative: 0 % (ref 0–1)
EOS ABS: 0.2 10*3/uL (ref 0.0–0.7)
Eosinophils Relative: 4 % (ref 0–5)
HEMATOCRIT: 42.2 % (ref 36.0–46.0)
Hemoglobin: 14.7 g/dL (ref 12.0–15.0)
LYMPHS PCT: 23 % (ref 12–46)
Lymphs Abs: 1.3 10*3/uL (ref 0.7–4.0)
MCH: 29.3 pg (ref 26.0–34.0)
MCHC: 34.8 g/dL (ref 30.0–36.0)
MCV: 84.2 fL (ref 78.0–100.0)
MONO ABS: 0.6 10*3/uL (ref 0.1–1.0)
Monocytes Relative: 10 % (ref 3–12)
Neutro Abs: 3.7 10*3/uL (ref 1.7–7.7)
Neutrophils Relative %: 64 % (ref 43–77)
Platelets: 229 10*3/uL (ref 150–400)
RBC: 5.01 MIL/uL (ref 3.87–5.11)
RDW: 13.5 % (ref 11.5–15.5)
WBC: 5.8 10*3/uL (ref 4.0–10.5)

## 2013-11-04 LAB — URINE MICROSCOPIC-ADD ON

## 2013-11-04 LAB — URINALYSIS, ROUTINE W REFLEX MICROSCOPIC
Glucose, UA: NEGATIVE mg/dL
Hgb urine dipstick: NEGATIVE
KETONES UR: 15 mg/dL — AB
Nitrite: NEGATIVE
PROTEIN: 30 mg/dL — AB
SPECIFIC GRAVITY, URINE: 1.029 (ref 1.005–1.030)
UROBILINOGEN UA: 0.2 mg/dL (ref 0.0–1.0)
pH: 6 (ref 5.0–8.0)

## 2013-11-04 LAB — PREGNANCY, URINE: Preg Test, Ur: NEGATIVE

## 2013-11-04 MED ORDER — ONDANSETRON HCL 4 MG/2ML IJ SOLN
4.0000 mg | Freq: Once | INTRAMUSCULAR | Status: AC
  Administered 2013-11-04: 4 mg via INTRAVENOUS
  Filled 2013-11-04: qty 2

## 2013-11-04 MED ORDER — DICYCLOMINE HCL 20 MG PO TABS: 20.0000 mg | ORAL_TABLET | Freq: Two times a day (BID) | ORAL | Status: DC

## 2013-11-04 MED ORDER — KETOROLAC TROMETHAMINE 30 MG/ML IJ SOLN
30.0000 mg | Freq: Once | INTRAMUSCULAR | Status: AC
  Administered 2013-11-04: 30 mg via INTRAVENOUS
  Filled 2013-11-04: qty 1

## 2013-11-04 MED ORDER — DIPHENOXYLATE-ATROPINE 2.5-0.025 MG PO TABS
1.0000 | ORAL_TABLET | Freq: Once | ORAL | Status: AC
  Administered 2013-11-04: 1 via ORAL
  Filled 2013-11-04: qty 1

## 2013-11-04 MED ORDER — DICYCLOMINE HCL 10 MG PO CAPS
10.0000 mg | ORAL_CAPSULE | Freq: Once | ORAL | Status: AC
  Administered 2013-11-04: 10 mg via ORAL
  Filled 2013-11-04: qty 1

## 2013-11-04 MED ORDER — ONDANSETRON HCL 4 MG PO TABS: 4.0000 mg | ORAL_TABLET | Freq: Four times a day (QID) | ORAL | Status: DC

## 2013-11-04 MED ORDER — SODIUM CHLORIDE 0.9 % IV BOLUS (SEPSIS)
1000.0000 mL | Freq: Once | INTRAVENOUS | Status: AC
  Administered 2013-11-04: 1000 mL via INTRAVENOUS

## 2013-11-04 NOTE — Discharge Instructions (Signed)
Abdominal Pain, Women °Abdominal (stomach, pelvic, or belly) pain can be caused by many things. It is important to tell your doctor: °· The location of the pain. °· Does it come and go or is it present all the time? °· Are there things that start the pain (eating certain foods, exercise)? °· Are there other symptoms associated with the pain (fever, nausea, vomiting, diarrhea)? °All of this is helpful to know when trying to find the cause of the pain. °CAUSES  °· Stomach: virus or bacteria infection, or ulcer. °· Intestine: appendicitis (inflamed appendix), regional ileitis (Crohn's disease), ulcerative colitis (inflamed colon), irritable bowel syndrome, diverticulitis (inflamed diverticulum of the colon), or cancer of the stomach or intestine. °· Gallbladder disease or stones in the gallbladder. °· Kidney disease, kidney stones, or infection. °· Pancreas infection or cancer. °· Fibromyalgia (pain disorder). °· Diseases of the female organs: °· Uterus: fibroid (non-cancerous) tumors or infection. °· Fallopian tubes: infection or tubal pregnancy. °· Ovary: cysts or tumors. °· Pelvic adhesions (scar tissue). °· Endometriosis (uterus lining tissue growing in the pelvis and on the pelvic organs). °· Pelvic congestion syndrome (female organs filling up with blood just before the menstrual period). °· Pain with the menstrual period. °· Pain with ovulation (producing an egg). °· Pain with an IUD (intrauterine device, birth control) in the uterus. °· Cancer of the female organs. °· Functional pain (pain not caused by a disease, may improve without treatment). °· Psychological pain. °· Depression. °DIAGNOSIS  °Your doctor will decide the seriousness of your pain by doing an examination. °· Blood tests. °· X-rays. °· Ultrasound. °· CT scan (computed tomography, special type of X-ray). °· MRI (magnetic resonance imaging). °· Cultures, for infection. °· Barium enema (dye inserted in the large intestine, to better view it with  X-rays). °· Colonoscopy (looking in intestine with a lighted tube). °· Laparoscopy (minor surgery, looking in abdomen with a lighted tube). °· Major abdominal exploratory surgery (looking in abdomen with a large incision). °TREATMENT  °The treatment will depend on the cause of the pain.  °· Many cases can be observed and treated at home. °· Over-the-counter medicines recommended by your caregiver. °· Prescription medicine. °· Antibiotics, for infection. °· Birth control pills, for painful periods or for ovulation pain. °· Hormone treatment, for endometriosis. °· Nerve blocking injections. °· Physical therapy. °· Antidepressants. °· Counseling with a psychologist or psychiatrist. °· Minor or major surgery. °HOME CARE INSTRUCTIONS  °· Do not take laxatives, unless directed by your caregiver. °· Take over-the-counter pain medicine only if ordered by your caregiver. Do not take aspirin because it can cause an upset stomach or bleeding. °· Try a clear liquid diet (broth or water) as ordered by your caregiver. Slowly move to a bland diet, as tolerated, if the pain is related to the stomach or intestine. °· Have a thermometer and take your temperature several times a day, and record it. °· Bed rest and sleep, if it helps the pain. °· Avoid sexual intercourse, if it causes pain. °· Avoid stressful situations. °· Keep your follow-up appointments and tests, as your caregiver orders. °· If the pain does not go away with medicine or surgery, you may try: °· Acupuncture. °· Relaxation exercises (yoga, meditation). °· Group therapy. °· Counseling. °SEEK MEDICAL CARE IF:  °· You notice certain foods cause stomach pain. °· Your home care treatment is not helping your pain. °· You need stronger pain medicine. °· You want your IUD removed. °· You feel faint or   lightheaded.  You develop nausea and vomiting.  You develop a rash.  You are having side effects or an allergy to your medicine. SEEK IMMEDIATE MEDICAL CARE IF:   Your  pain does not go away or gets worse.  You have a fever.  Your pain is felt only in portions of the abdomen. The right side could possibly be appendicitis. The left lower portion of the abdomen could be colitis or diverticulitis.  You are passing blood in your stools (bright red or black tarry stools, with or without vomiting).  You have blood in your urine.  You develop chills, with or without a fever.  You pass out. MAKE SURE YOU:   Understand these instructions.  Will watch your condition.  Will get help right away if you are not doing well or get worse. Document Released: 08/04/2007 Document Revised: 12/30/2011 Document Reviewed: 08/24/2009 [facility] Patient Information 2014 [place], [place].  Diarrhea Diarrhea is frequent loose and watery bowel movements. It can cause you to feel weak and dehydrated. Dehydration can cause you to become tired and thirsty, have a dry mouth, and have decreased urination that often is dark yellow. Diarrhea is a sign of another problem, most often an infection that will not last long. In most cases, diarrhea typically lasts 2 3 days. However, it can last longer if it is a sign of something more serious. It is important to treat your diarrhea as directed by your caregive to lessen or prevent future episodes of diarrhea. CAUSES  Some common causes include:  Gastrointestinal infections caused by viruses, bacteria, or parasites.  Food poisoning or food allergies.  Certain medicines, such as antibiotics, chemotherapy, and laxatives.  Artificial sweeteners and fructose.  Digestive disorders. HOME CARE INSTRUCTIONS  Ensure adequate fluid intake (hydration): have 1 cup (8 oz) of fluid for each diarrhea episode. Avoid fluids that contain simple sugars or sports drinks, fruit juices, whole milk products, and sodas. Your urine should be clear or pale yellow if you are drinking enough fluids. Hydrate with an oral rehydration solution that you can purchase at  pharmacies, retail stores, and online. You can prepare an oral rehydration solution at home by mixing the following ingredients together:    tsp table salt.   tsp baking soda.   tsp salt substitute containing potassium chloride.  1  tablespoons sugar.  1 L (34 oz) of water.  Certain foods and beverages may increase the speed at which food moves through the gastrointestinal (GI) tract. These foods and beverages should be avoided and include:  Caffeinated and alcoholic beverages.  High-fiber foods, such as raw fruits and vegetables, nuts, seeds, and whole grain breads and cereals.  Foods and beverages sweetened with sugar alcohols, such as xylitol, sorbitol, and mannitol.  Some foods may be well tolerated and may help thicken stool including:  Starchy foods, such as rice, toast, pasta, low-sugar cereal, oatmeal, grits, baked potatoes, crackers, and bagels.  Bananas.  Applesauce.  Add probiotic-rich foods to help increase healthy bacteria in the GI tract, such as yogurt and fermented milk products.  Wash your hands well after each diarrhea episode.  Only take over-the-counter or prescription medicines as directed by your caregiver.  Take a warm bath to relieve any burning or pain from frequent diarrhea episodes. SEEK IMMEDIATE MEDICAL CARE IF:   You are unable to keep fluids down.  You have persistent vomiting.  You have blood in your stool, or your stools are black and tarry.  You do not urinate in  6 8 hours, or there is only a small amount of very dark urine.  You have abdominal pain that increases or localizes.  You have weakness, dizziness, confusion, or lightheadedness.  You have a severe headache.  Your diarrhea gets worse or does not get better.  You have a fever or persistent symptoms for more than 2 3 days.  You have a fever and your symptoms suddenly get worse. MAKE SURE YOU:   Understand these instructions.  Will watch your condition.  Will get  help right away if you are not doing well or get worse. Document Released: 09/27/2002 Document Revised: 09/23/2012 Document Reviewed: 06/14/2012 [facility] Patient Information 2014 [place], [place].

## 2013-11-04 NOTE — ED Notes (Signed)
Diarrhea and body aches as well as headache onset yesterday denies nausea or vomiting

## 2013-11-04 NOTE — ED Provider Notes (Signed)
TIME SEEN: 9:39 AM  CHIEF COMPLAINT: Headache, body aches, diarrhea, crampy abdominal pain  HPI: Patient is a 43 year old female with a history of migraine headaches who presents to the emergency department with diffuse, gradual onset, throbbing headache without radiation, diffuse body aches, crampy epigastric abdominal pain and multiple episodes of nonbloody diarrhea that started yesterday. She describes her headache as a "regular people headache". It is not similar to her migraines. It is not thunderclap. No neck pain or neck stiffness. No fever. She is having chills. She's not having nausea or vomiting. No dysuria or hematuria. Her last menstrual period was one month ago. No vaginal discharge. No sick contacts, recent travel, hospitalization or antibiotic use. She states she is having diarrhea "every 10 minutes". Patient is status post cholecystectomy. She reports she has taken her home Flexeril and Imodium without relief of symptoms.  ROS: See HPI Constitutional: no fever  Eyes: no drainage  ENT: no runny nose   Cardiovascular:  no chest pain  Resp: no SOB  GI: no vomiting GU: no dysuria Integumentary: no rash  Allergy: no hives  Musculoskeletal: no leg swelling  Neurological: no slurred speech ROS otherwise negative  PAST MEDICAL HISTORY/PAST SURGICAL HISTORY:  Past Medical History  Diagnosis Date  . Migraine   . Migraine     MEDICATIONS:  Prior to Admission medications   Medication Sig Start Date End Date Taking? Authorizing Provider  aspirin-acetaminophen-caffeine (EXCEDRIN MIGRAINE) [id number] MG per tablet Take 2 tablets by mouth every 6 (six) hours as needed. For migraines    Historical Provider, MD  cephALEXin (KEFLEX) 500 MG capsule Take 1 capsule (500 mg total) by mouth 4 (four) times daily. 06/10/13   Fransico Meadow, PA-C  cyclobenzaprine (FLEXERIL) 5 MG tablet Take 5 mg by mouth 3 (three) times daily as needed for muscle spasms.    Historical Provider, MD   HYDROcodone-acetaminophen (NORCO) 5-325 MG per tablet Take 1 tablet by mouth every 6 (six) hours as needed for severe pain. 09/14/13   Orlie Dakin, MD  HYDROcodone-acetaminophen (NORCO/VICODIN) 5-325 MG per tablet Take 2 tablets by mouth every 4 (four) hours as needed for pain. 06/10/13   Fransico Meadow, PA-C  ibuprofen (ADVIL,MOTRIN) 800 MG tablet Take 1 tablet (800 mg total) by mouth every 8 (eight) hours as needed for pain. 10/27/12   Charles B. Karle Starch, MD  loperamide (IMODIUM A-D) 2 MG tablet Take 1 tablet (2 mg total) by mouth 4 (four) times daily as needed for diarrhea or loose stools. 10/25/12   Mervin Kung, MD  ondansetron (ZOFRAN ODT) 4 MG disintegrating tablet Take 1 tablet (4 mg total) by mouth every 8 (eight) hours as needed for nausea. 10/25/12   Mervin Kung, MD  oxyCODONE-acetaminophen (PERCOCET/ROXICET) 5-325 MG per tablet Take 1-2 tablets by mouth every 6 (six) hours as needed for pain. 10/27/12   Charles B. Karle Starch, MD  phenazopyridine (PYRIDIUM) 200 MG tablet Take 1 tablet (200 mg total) by mouth 3 (three) times daily. 06/10/13   Fransico Meadow, PA-C  potassium chloride SA (K-DUR,KLOR-CON) 20 MEQ tablet Take 1 tablet (20 mEq total) by mouth 2 (two) times daily. 10/25/12   Mervin Kung, MD  Prenatal Vit-Fe Fumarate-FA (MULTIVITAMIN-PRENATAL) 27-0.8 MG TABS Take 1 tablet by mouth daily.    Historical Provider, MD  Prenatal Vit-Fe Fumarate-FA (PRENATAL MULTIVITAMIN) TABS Take 1 tablet by mouth daily at 12 noon.    Historical Provider, MD  promethazine (PHENERGAN) 25 MG tablet Take 1 tablet (25 mg  total) by mouth every 6 (six) hours as needed for nausea. 10/25/12   Mervin Kung, MD  propranolol (INDERAL) 80 MG tablet Take 80 mg by mouth daily.    Historical Provider, MD  UNABLE TO FIND Med Name:    Historical Provider, MD  UNABLE TO FIND B/p med for migraine but not sure of the name    Historical Provider, MD    ALLERGIES:  Allergies  Allergen Reactions  . Midazolam  Hcl Rash    SOCIAL HISTORY:  History  Substance Use Topics  . Smoking status: Never Smoker   . Smokeless tobacco: Not on file  . Alcohol Use: No    FAMILY HISTORY: History reviewed. No pertinent family history.  EXAM: BP 128/80  Pulse 84  Temp(Src) 98.5 F (36.9 C) (Oral)  Resp 16  Ht 5\' 4"  (1.626 m)  Wt 145 lb (65.772 kg)  BMI 24.88 kg/m2  SpO2 100%  LMP 10/04/2013 CONSTITUTIONAL: Alert and oriented and responds appropriately to questions. Well-appearing; well-nourished, appears uncomfortable but nontoxic HEAD: Normocephalic EYES: Conjunctivae clear, PERRL ENT: normal nose; no rhinorrhea; moist mucous membranes; pharynx without lesions noted NECK: Supple, no meningismus, no LAD  CARD: RRR; S1 and S2 appreciated; no murmurs, no clicks, no rubs, no gallops RESP: Normal chest excursion without splinting or tachypnea; breath sounds clear and equal bilaterally; no wheezes, no rhonchi, no rales,  ABD/GI: Normal bowel sounds; non-distended; soft, mildly tender to palpation in the epigastric region, no rebound or guarding, no peritoneal signs BACK:  The back appears normal and is non-tender to palpation, there is no CVA tenderness EXT: Normal ROM in all joints; non-tender to palpation; no edema; normal capillary refill; no cyanosis    SKIN: Normal color for age and race; warm NEURO: Moves all extremities equally, cranial nerves II through XII intact, sensation to light touch intact diffusely, normal gait PSYCH: The patient's mood and manner are appropriate. Grooming and personal hygiene are appropriate.  MEDICAL DECISION MAKING: Patient here with headache, body aches, abdominal cramping and diarrhea. Suspect viral illness. She is hemodynamically stable. She is tender to palpation in her epigastric region but has no guarding or rebound or peritoneal signs. Given her frequent episodes of diarrhea, will check basic labs, urine. We'll give IV fluids, Toradol, Zofran and Bentyl and  reassess. I do not feel she needs abdominal imaging at this time. Patient agrees with this plan.  ED PROGRESS: Patient's labs are reassuring. She does have leukocytes but also bacteria and squamous cells in her urine. Suspect her to catch. Urine culture pending. Patient states she feels much better. We'll discharge home with prescription for Zofran and Bentyl. Given return precautions. Patient verbalizes understanding and is comfortable plan.     [place], DO 11/04/13 1046

## 2013-11-05 LAB — URINE CULTURE

## 2013-11-06 ENCOUNTER — Emergency Department (HOSPITAL_BASED_OUTPATIENT_CLINIC_OR_DEPARTMENT_OTHER): Payer: 59

## 2013-11-06 ENCOUNTER — Encounter (HOSPITAL_BASED_OUTPATIENT_CLINIC_OR_DEPARTMENT_OTHER): Payer: Self-pay | Admitting: Emergency Medicine

## 2013-11-06 ENCOUNTER — Emergency Department (HOSPITAL_BASED_OUTPATIENT_CLINIC_OR_DEPARTMENT_OTHER)
Admission: EM | Admit: 2013-11-06 | Discharge: 2013-11-07 | Disposition: A | Discharge status: Home / Self Care | Payer: 59 | Attending: Emergency Medicine | Admitting: Emergency Medicine

## 2013-11-06 DIAGNOSIS — S39012A Strain of muscle, fascia and tendon of lower back, initial encounter: Secondary | ICD-10-CM

## 2013-11-06 DIAGNOSIS — S335XXA Sprain of ligaments of lumbar spine, initial encounter: Secondary | ICD-10-CM | POA: Insufficient documentation

## 2013-11-06 DIAGNOSIS — Z8679 Personal history of other diseases of the circulatory system: Secondary | ICD-10-CM | POA: Insufficient documentation

## 2013-11-06 DIAGNOSIS — X500XXA Overexertion from strenuous movement or load, initial encounter: Secondary | ICD-10-CM | POA: Insufficient documentation

## 2013-11-06 DIAGNOSIS — Y929 Unspecified place or not applicable: Secondary | ICD-10-CM | POA: Insufficient documentation

## 2013-11-06 DIAGNOSIS — Y9389 Activity, other specified: Secondary | ICD-10-CM | POA: Insufficient documentation

## 2013-11-06 DIAGNOSIS — Z3202 Encounter for pregnancy test, result negative: Secondary | ICD-10-CM | POA: Insufficient documentation

## 2013-11-06 MED ORDER — OXYCODONE-ACETAMINOPHEN 5-325 MG PO TABS
1.0000 | ORAL_TABLET | Freq: Once | ORAL | Status: AC
  Administered 2013-11-07: 1 via ORAL
  Filled 2013-11-06: qty 1

## 2013-11-06 MED ORDER — METHOCARBAMOL 500 MG PO TABS
1000.0000 mg | ORAL_TABLET | Freq: Once | ORAL | Status: AC
  Administered 2013-11-07: 1000 mg via ORAL
  Filled 2013-11-06: qty 2

## 2013-11-06 NOTE — ED Notes (Signed)
Pt reports lower back pain that started Thursday - denies injury. States she took Motrin with no effect noted.

## 2013-11-06 NOTE — ED Provider Notes (Signed)
CSN: [id number]     Arrival date & time 11/06/13  2136 History  This chart was scribed for Kailash Hinze Alfonso Patten, MD by Maree Erie, ED Scribe. The patient was seen in room MH01/MH01. Patient's care was started at 11:21 PM.    Chief Complaint  Patient presents with  . Back Pain    Patient is a 43 y.o. female presenting with back pain. The history is provided by the patient. No language interpreter was used.  Back Pain Location:  Lumbar spine Quality:  Aching Radiates to:  Does not radiate Pain severity:  Moderate Pain is:  Same all the time Onset quality:  Gradual Duration:  3 days Timing:  Constant Chronicity:  New Context: lifting heavy objects (three year old child)   Context: not MCA and not MVA   Relieved by:  Nothing Worsened by:  Nothing tried Ineffective treatments:  Ibuprofen and heating pad Associated symptoms: no abdominal pain, no abdominal swelling, no bladder incontinence, no bowel incontinence, no chest pain, no dysuria, no fever, no headaches, no leg pain, no numbness, no paresthesias, no pelvic pain, no perianal numbness, no tingling, no weakness and no weight loss   Risk factors: no hx of cancer     HPI Comments: [name] is a 43 y.o. female who presents to the Emergency Department complaining of lower back pain that began two days ago. She describes the pain as aching and sharp with movement. She denies any acute injury, lifting, twisting or new exercise regiments, however she has a three year old child that she picks up. She has been taking 800 mg Motrin yesterday and Advil today as well as using a heat wrap without relief. She denies fever, bruising, frequency or dysuria. Her LNMP began yesterday.   Pelvis stable. Good BS    Past Medical History  Diagnosis Date  . Migraine   . Migraine    Past Surgical History  Procedure Laterality Date  . Cholecystectomy    . Rotator cuff repair     History reviewed. No pertinent family history. History   Substance Use Topics  . Smoking status: Never Smoker   . Smokeless tobacco: Not on file  . Alcohol Use: No   OB History   Grav Para Term Preterm Abortions TAB SAB Ect Mult Living                 Review of Systems  Constitutional: Negative for fever and weight loss.  Cardiovascular: Negative for chest pain.  Gastrointestinal: Negative for abdominal pain and bowel incontinence.  Genitourinary: Negative for bladder incontinence, dysuria, frequency and pelvic pain.  Musculoskeletal: Positive for back pain.  Neurological: Negative for tingling, weakness, numbness, headaches and paresthesias.  All other systems reviewed and are negative.    Allergies  Midazolam hcl  Home Medications    Triage Vitals: BP 151/99  Pulse 80  Temp(Src) 98.2 F (36.8 C)  Resp 18  Ht 5\' 1"  (1.549 m)  Wt 145 lb (65.772 kg)  BMI 27.41 kg/m2  SpO2 100%  LMP 11/06/2013  Physical Exam  Nursing note and vitals reviewed. Constitutional: She is oriented to person, place, and time. She appears well-developed and well-nourished.  HENT:  Head: Normocephalic and atraumatic.  Mouth/Throat: Oropharynx is clear and moist.  Eyes: EOM are normal. Pupils are equal, round, and reactive to light.  Neck: Normal range of motion. Neck supple.  Cardiovascular: Normal rate, regular rhythm and intact distal pulses.   Pulses:      Dorsalis  pedis pulses are 2+ on the right side, and 2+ on the left side.  Pulmonary/Chest: Effort normal and breath sounds normal. No respiratory distress. She has no wheezes. She has no rales.  Abdominal: Soft. Bowel sounds are normal. There is no tenderness. There is no rebound and no guarding.  Genitourinary:  Pel  Musculoskeletal: Normal range of motion. She exhibits no edema and no tenderness.  S1 paraspinal tenderness. No midline tenderness. No crepitus or stepoffs. Pelvis stable.   Neurological: She is alert and oriented to person, place, and time. She has normal reflexes.   Sensation and strength intact.   Skin: Skin is warm and dry.  Psychiatric: She has a normal mood and affect.    ED Course  Procedures (including critical care time)  DIAGNOSTIC STUDIES: Oxygen Saturation is 100% on room air, normal by my interpretation.    COORDINATION OF CARE: 11:27 PM -Will order pregnancy urine test and lumbar spine x-ray. Patient verbalizes understanding and agrees with treatment plan.    Labs Review Labs Reviewed - No data to display Imaging Review No results found.  EKG Interpretation   None       MDM  No diagnosis found. Lumbar strain.  Will treat with NSAIDS muscle relaxants and steroids  I personally performed the services described in this documentation, which was scribed in my presence. The recorded information has been reviewed and is accurate.   Carlisle Beers, MD 11/07/13 [phone]

## 2013-11-07 LAB — PREGNANCY, URINE: PREG TEST UR: NEGATIVE

## 2013-11-07 MED ORDER — MELOXICAM 7.5 MG PO TABS: 7.5000 mg | ORAL_TABLET | Freq: Every day | ORAL | Status: DC

## 2013-11-07 MED ORDER — METHOCARBAMOL 500 MG PO TABS: 500.0000 mg | ORAL_TABLET | Freq: Two times a day (BID) | ORAL | Status: DC

## 2013-11-07 MED ORDER — PREDNISONE 20 MG PO TABS: ORAL_TABLET | ORAL | Status: DC

## 2013-11-25 ENCOUNTER — Ambulatory Visit: Payer: 59 | Attending: Orthopaedic Surgery | Admitting: Rehabilitation

## 2013-11-25 DIAGNOSIS — M545 Low back pain, unspecified: Secondary | ICD-10-CM | POA: Diagnosis not present

## 2013-11-25 DIAGNOSIS — IMO0001 Reserved for inherently not codable concepts without codable children: Secondary | ICD-10-CM | POA: Insufficient documentation

## 2013-11-26 ENCOUNTER — Encounter (HOSPITAL_BASED_OUTPATIENT_CLINIC_OR_DEPARTMENT_OTHER): Payer: Self-pay | Admitting: Emergency Medicine

## 2013-11-26 ENCOUNTER — Emergency Department (HOSPITAL_BASED_OUTPATIENT_CLINIC_OR_DEPARTMENT_OTHER)
Admission: EM | Admit: 2013-11-26 | Discharge: 2013-11-26 | Disposition: A | Discharge status: Home / Self Care | Payer: 59 | Attending: Emergency Medicine | Admitting: Emergency Medicine

## 2013-11-26 DIAGNOSIS — R509 Fever, unspecified: Secondary | ICD-10-CM | POA: Diagnosis present

## 2013-11-26 DIAGNOSIS — Z791 Long term (current) use of non-steroidal anti-inflammatories (NSAID): Secondary | ICD-10-CM | POA: Diagnosis not present

## 2013-11-26 DIAGNOSIS — G43909 Migraine, unspecified, not intractable, without status migrainosus: Secondary | ICD-10-CM | POA: Insufficient documentation

## 2013-11-26 DIAGNOSIS — J111 Influenza due to unidentified influenza virus with other respiratory manifestations: Secondary | ICD-10-CM

## 2013-11-26 DIAGNOSIS — M549 Dorsalgia, unspecified: Secondary | ICD-10-CM | POA: Diagnosis not present

## 2013-11-26 DIAGNOSIS — Z792 Long term (current) use of antibiotics: Secondary | ICD-10-CM | POA: Diagnosis not present

## 2013-11-26 DIAGNOSIS — Z79899 Other long term (current) drug therapy: Secondary | ICD-10-CM | POA: Insufficient documentation

## 2013-11-26 MED ORDER — OXYCODONE-ACETAMINOPHEN 5-325 MG PO TABS
2.0000 | ORAL_TABLET | ORAL | Status: DC | PRN
Start: 2013-11-26 — End: 2014-11-24

## 2013-11-26 MED ORDER — DIAZEPAM 5 MG PO TABS
5.0000 mg | ORAL_TABLET | Freq: Once | ORAL | Status: AC
  Administered 2013-11-26: 5 mg via ORAL
  Filled 2013-11-26: qty 1

## 2013-11-26 MED ORDER — KETOROLAC TROMETHAMINE 60 MG/2ML IM SOLN
60.0000 mg | Freq: Once | INTRAMUSCULAR | Status: AC
  Administered 2013-11-26: 60 mg via INTRAMUSCULAR
  Filled 2013-11-26: qty 2

## 2013-11-26 MED ORDER — DIAZEPAM 5 MG PO TABS: 5.0000 mg | ORAL_TABLET | Freq: Two times a day (BID) | ORAL | Status: DC

## 2013-11-26 NOTE — Discharge Instructions (Signed)
Influenza, Adult Influenza ("the flu") is a viral infection of the respiratory tract. It occurs more often in winter months because people spend more time in close contact with one another. Influenza can make you feel very sick. Influenza easily spreads from person to person (contagious). CAUSES  Influenza is caused by a virus that infects the respiratory tract. You can catch the virus by breathing in droplets from an infected person's cough or sneeze. You can also catch the virus by touching something that was recently contaminated with the virus and then touching your mouth, nose, or eyes. SYMPTOMS  Symptoms typically last 4 to 10 days and may include:  Fever.  Chills.  Headache, body aches, and muscle aches.  Sore throat.  Chest discomfort and cough.  Poor appetite.  Weakness or feeling tired.  Dizziness.  Nausea or vomiting. DIAGNOSIS  Diagnosis of influenza is often made based on your history and a physical exam. A nose or throat swab test can be done to confirm the diagnosis. RISKS AND COMPLICATIONS You may be at risk for a more severe case of influenza if you smoke cigarettes, have diabetes, have chronic heart disease (such as heart failure) or lung disease (such as asthma), or if you have a weakened immune system. Elderly people and pregnant women are also at risk for more serious infections. The most common complication of influenza is a lung infection (pneumonia). Sometimes, this complication can require emergency medical care and may be life-threatening. PREVENTION  An annual influenza vaccination (flu shot) is the best way to avoid getting influenza. An annual flu shot is now routinely recommended for all adults in the [place] TREATMENT  In mild cases, influenza goes away on its own. Treatment is directed at relieving symptoms. For more severe cases, your caregiver may prescribe antiviral medicines to shorten the sickness. Antibiotic medicines are not effective, because the  infection is caused by a virus, not by bacteria. HOME CARE INSTRUCTIONS  Only take over-the-counter or prescription medicines for pain, discomfort, or fever as directed by your caregiver.  Use a cool mist humidifier to make breathing easier.  Get plenty of rest until your temperature returns to normal. This usually takes 3 to 4 days.  Drink enough fluids to keep your urine clear or pale yellow.  Cover your mouth and nose when coughing or sneezing, and wash your hands well to avoid spreading the virus.  Stay home from work or school until your fever has been gone for at least 1 full day. SEEK MEDICAL CARE IF:   You have chest pain or a deep cough that worsens or produces more mucus.  You have nausea, vomiting, or diarrhea. SEEK IMMEDIATE MEDICAL CARE IF:   You have difficulty breathing, shortness of breath, or your skin or nails turn bluish.  You have severe neck pain or stiffness.  You have a severe headache, facial pain, or earache.  You have a worsening or recurring fever.  You have nausea or vomiting that cannot be controlled. MAKE SURE YOU:  Understand these instructions.  Will watch your condition.  Will get help right away if you are not doing well or get worse. Document Released: 10/04/2000 Document Revised: 04/07/2012 Document Reviewed: 01/06/2012 [facility] Patient Information 2014 [place], [place].  Rest  Drink plenty of fluids See Ortho for ongoing follow-up of back pain Return if increased shortness of breath or difficulty breathing

## 2013-11-26 NOTE — ED Notes (Signed)
C/o fever since last night-pt with 5 family members being seen for same

## 2013-11-26 NOTE — ED Provider Notes (Signed)
CSN: [id number]     Arrival date & time 11/26/13  1924 History   First MD Initiated Contact with Patient 11/26/13 2008     Chief Complaint  Patient presents with  . Fever   (Consider location/radiation/quality/duration/timing/severity/associated sxs/prior Treatment) Patient is a 43 y.o. female presenting with fever. The history is provided by the patient. No language interpreter was used.  Fever Max temp prior to arrival:  100.8 Temp source:  Oral Duration:  1 day Associated symptoms: cough   Associated symptoms: no chest pain, no diarrhea, no dysuria, no ear pain, no nausea, no rash, no sore throat and no vomiting   Pt is a 43 year old female who presents with cough, fever and back pain. She reports that her whole family has been sick with the same symptoms. She denies any GI symptoms or dysuria. She reports having body aches with more intense back pain from a recent injury. She reports that she is going to physical therapy for her back problem.  Past Medical History  Diagnosis Date  . Migraine   . Migraine    Past Surgical History  Procedure Laterality Date  . Cholecystectomy    . Rotator cuff repair     No family history on file. History  Substance Use Topics  . Smoking status: Never Smoker   . Smokeless tobacco: Not on file  . Alcohol Use: No   OB History   Grav Para Term Preterm Abortions TAB SAB Ect Mult Living                 Review of Systems  Constitutional: Positive for fever.  HENT: Negative for ear pain and sore throat.   Respiratory: Positive for cough.   Cardiovascular: Negative for chest pain.  Gastrointestinal: Negative for nausea, vomiting and diarrhea.  Genitourinary: Negative for dysuria.  Musculoskeletal: Positive for back pain.  Skin: Negative for rash.  All other systems reviewed and are negative.    Allergies  Midazolam hcl  Home Medications   Current Outpatient Rx  Name  Route  Sig  Dispense  Refill  . aspirin-acetaminophen-caffeine  (EXCEDRIN MIGRAINE) 250-250-65 MG per tablet   Oral   Take 2 tablets by mouth every 6 (six) hours as needed. For migraines         . cephALEXin (KEFLEX) 500 MG capsule   Oral   Take 1 capsule (500 mg total) by mouth 4 (four) times daily.   40 capsule   0   . cyclobenzaprine (FLEXERIL) 5 MG tablet   Oral   Take 5 mg by mouth 3 (three) times daily as needed for muscle spasms.         . diazepam (VALIUM) 5 MG tablet   Oral   Take 1 tablet (5 mg total) by mouth 2 (two) times daily.   20 tablet   0   . dicyclomine (BENTYL) 20 MG tablet   Oral   Take 1 tablet (20 mg total) by mouth 2 (two) times daily. As needed for abdominal cramping   15 tablet   0   . HYDROcodone-acetaminophen (NORCO) 5-325 MG per tablet   Oral   Take 1 tablet by mouth every 6 (six) hours as needed for severe pain.   10 tablet   0   . HYDROcodone-acetaminophen (NORCO/VICODIN) 5-325 MG per tablet   Oral   Take 2 tablets by mouth every 4 (four) hours as needed for pain.   10 tablet   0   . ibuprofen (ADVIL,MOTRIN)  800 MG tablet   Oral   Take 1 tablet (800 mg total) by mouth every 8 (eight) hours as needed for pain.   30 tablet   0   . loperamide (IMODIUM A-D) 2 MG tablet   Oral   Take 1 tablet (2 mg total) by mouth 4 (four) times daily as needed for diarrhea or loose stools.   30 tablet   0   . meloxicam (MOBIC) 7.5 MG tablet   Oral   Take 1 tablet (7.5 mg total) by mouth daily.   7 tablet   0   . methocarbamol (ROBAXIN) 500 MG tablet   Oral   Take 1 tablet (500 mg total) by mouth 2 (two) times daily.   20 tablet   0   . ondansetron (ZOFRAN ODT) 4 MG disintegrating tablet   Oral   Take 1 tablet (4 mg total) by mouth every 8 (eight) hours as needed for nausea.   12 tablet   0   . ondansetron (ZOFRAN) 4 MG tablet   Oral   Take 1 tablet (4 mg total) by mouth every 6 (six) hours.   12 tablet   0   . oxyCODONE-acetaminophen (PERCOCET/ROXICET) 5-325 MG per tablet   Oral   Take 1-2  tablets by mouth every 6 (six) hours as needed for pain.   20 tablet   0   . oxyCODONE-acetaminophen (PERCOCET/ROXICET) 5-325 MG per tablet   Oral   Take 2 tablets by mouth every 4 (four) hours as needed for severe pain.   10 tablet   0   . phenazopyridine (PYRIDIUM) 200 MG tablet   Oral   Take 1 tablet (200 mg total) by mouth 3 (three) times daily.   6 tablet   0   . potassium chloride SA (K-DUR,KLOR-CON) 20 MEQ tablet   Oral   Take 1 tablet (20 mEq total) by mouth 2 (two) times daily.   10 tablet   0   . predniSONE (DELTASONE) 20 MG tablet      3 tabs po day one, then 2 po daily x 4 days   11 tablet   0   . Prenatal Vit-Fe Fumarate-FA (MULTIVITAMIN-PRENATAL) 27-0.8 MG TABS   Oral   Take 1 tablet by mouth daily.         . Prenatal Vit-Fe Fumarate-FA (PRENATAL MULTIVITAMIN) TABS   Oral   Take 1 tablet by mouth daily at 12 noon.         . promethazine (PHENERGAN) 25 MG tablet   Oral   Take 1 tablet (25 mg total) by mouth every 6 (six) hours as needed for nausea.   12 tablet   0   . propranolol (INDERAL) 80 MG tablet   Oral   Take 80 mg by mouth daily.         Marland Kitchen UNABLE TO FIND      Med Name: unknown         . UNABLE TO FIND      B/p med for migraine but not sure of the name          BP 113/70  Pulse 104  Temp(Src) 98.8 F (37.1 C) (Oral)  Resp 20  Ht 5\' 1"  (1.549 m)  Wt 140 lb (63.504 kg)  BMI 26.47 kg/m2  SpO2 98%  LMP 11/06/2013 Physical Exam  Nursing note and vitals reviewed. Constitutional: She is oriented to person, place, and time. She appears well-developed and well-nourished. No distress.  HENT:  Head: Normocephalic and atraumatic.  Right Ear: External ear normal.  Left Ear: External ear normal.  Mouth/Throat: Oropharynx is clear and moist.  Eyes: Conjunctivae and EOM are normal.  Neck: Normal range of motion. Neck supple. No JVD present. No tracheal deviation present. No thyromegaly present.  Cardiovascular: Normal rate,  regular rhythm and normal heart sounds.   Pulmonary/Chest: Effort normal and breath sounds normal. No respiratory distress.  Abdominal: Soft. Bowel sounds are normal. She exhibits no distension. There is no tenderness. There is no rebound.  Musculoskeletal: Normal range of motion.  Lymphadenopathy:    She has no cervical adenopathy.  Neurological: She is alert and oriented to person, place, and time.  Skin: Skin is warm and dry.  Psychiatric: She has a normal mood and affect. Her behavior is normal. Judgment and thought content normal.    ED Course  Procedures (including critical care time) Labs Review Labs Reviewed - No data to display Imaging Review No results found.  EKG Interpretation   None       MDM   1. Influenza    Cough, body aches and fever. Exam and history consistent for the flu. Other family have had the same. Toradol injection and valium po given for back strain. Advised symptomatic treatment to include ibuprofen, increase oral fluids and rest. Follow-up with PCP and PT for back pain. No tachypnea, wheezing, shortness of breath or difficulty breathing. Return precautions given.    Elisha Headland, NP 12/05/13 1747

## 2013-11-29 ENCOUNTER — Ambulatory Visit: Payer: 59 | Admitting: Rehabilitation

## 2013-12-01 ENCOUNTER — Ambulatory Visit: Payer: 59 | Admitting: Rehabilitation

## 2013-12-02 ENCOUNTER — Encounter (HOSPITAL_BASED_OUTPATIENT_CLINIC_OR_DEPARTMENT_OTHER): Payer: Self-pay | Admitting: Emergency Medicine

## 2013-12-02 ENCOUNTER — Emergency Department (HOSPITAL_BASED_OUTPATIENT_CLINIC_OR_DEPARTMENT_OTHER)
Admission: EM | Admit: 2013-12-02 | Discharge: 2013-12-02 | Disposition: A | Discharge status: Home / Self Care | Payer: 59 | Attending: Emergency Medicine | Admitting: Emergency Medicine

## 2013-12-02 DIAGNOSIS — R509 Fever, unspecified: Secondary | ICD-10-CM | POA: Insufficient documentation

## 2013-12-02 DIAGNOSIS — R05 Cough: Secondary | ICD-10-CM | POA: Insufficient documentation

## 2013-12-02 DIAGNOSIS — M549 Dorsalgia, unspecified: Secondary | ICD-10-CM | POA: Insufficient documentation

## 2013-12-02 DIAGNOSIS — Z3202 Encounter for pregnancy test, result negative: Secondary | ICD-10-CM | POA: Insufficient documentation

## 2013-12-02 DIAGNOSIS — G43909 Migraine, unspecified, not intractable, without status migrainosus: Secondary | ICD-10-CM | POA: Insufficient documentation

## 2013-12-02 DIAGNOSIS — R059 Cough, unspecified: Secondary | ICD-10-CM | POA: Insufficient documentation

## 2013-12-02 DIAGNOSIS — G8929 Other chronic pain: Secondary | ICD-10-CM | POA: Insufficient documentation

## 2013-12-02 LAB — PREGNANCY, URINE: Preg Test, Ur: NEGATIVE

## 2013-12-02 MED ORDER — DIPHENHYDRAMINE HCL 50 MG/ML IJ SOLN
25.0000 mg | Freq: Once | INTRAMUSCULAR | Status: AC
  Administered 2013-12-02: 25 mg via INTRAVENOUS
  Filled 2013-12-02: qty 1

## 2013-12-02 MED ORDER — METOCLOPRAMIDE HCL 5 MG/ML IJ SOLN
5.0000 mg | Freq: Once | INTRAMUSCULAR | Status: AC
  Administered 2013-12-02: 5 mg via INTRAVENOUS
  Filled 2013-12-02: qty 2

## 2013-12-02 MED ORDER — DEXAMETHASONE SODIUM PHOSPHATE 10 MG/ML IJ SOLN
10.0000 mg | Freq: Once | INTRAMUSCULAR | Status: AC
  Administered 2013-12-02: 10 mg via INTRAVENOUS
  Filled 2013-12-02: qty 1

## 2013-12-02 MED ORDER — SODIUM CHLORIDE 0.9 % IV BOLUS (SEPSIS)
1000.0000 mL | INTRAVENOUS | Status: AC
  Administered 2013-12-02: 1000 mL via INTRAVENOUS

## 2013-12-02 MED ORDER — DEXAMETHASONE SODIUM PHOSPHATE 10 MG/ML IJ SOLN
10.0000 mg | Freq: Once | INTRAMUSCULAR | Status: DC
  Filled 2013-12-02: qty 1

## 2013-12-02 NOTE — ED Provider Notes (Signed)
CSN: [id number]     Arrival date & time 12/02/13  1313 History   First MD Initiated Contact with Patient 12/02/13 1319     Chief Complaint  Patient presents with  . Migraine     (Consider location/radiation/quality/duration/timing/severity/associated sxs/prior Treatment) Patient is a 43 y.o. female presenting with migraines. The history is provided by the patient.  Migraine This is a chronic problem. The current episode started 6 to 12 hours ago. The problem occurs constantly. The problem has not changed since onset.Pertinent negatives include no chest pain, no abdominal pain, no headaches and no shortness of breath. Exacerbated by: light. Nothing relieves the symptoms. She has tried acetaminophen for the symptoms. The treatment provided mild relief.    Past Medical History  Diagnosis Date  . Migraine   . Migraine    Past Surgical History  Procedure Laterality Date  . Cholecystectomy    . Rotator cuff repair     No family history on file. History  Substance Use Topics  . Smoking status: Never Smoker   . Smokeless tobacco: Not on file  . Alcohol Use: No   OB History   Grav Para Term Preterm Abortions TAB SAB Ect Mult Living                 Review of Systems  Constitutional: Positive for fever. Negative for fatigue.  HENT: Negative for congestion and drooling.   Eyes: Negative for pain.  Respiratory: Positive for cough. Negative for shortness of breath.   Cardiovascular: Negative for chest pain.  Gastrointestinal: Negative for nausea, vomiting, abdominal pain and diarrhea.  Genitourinary: Negative for dysuria and hematuria.  Musculoskeletal: Positive for back pain (chronic). Negative for gait problem and neck pain.  Skin: Negative for color change.  Neurological: Negative for dizziness and headaches.  Hematological: Negative for adenopathy.  Psychiatric/Behavioral: Negative for behavioral problems.  All other systems reviewed and are negative.      Allergies   Midazolam hcl  Home Medications    BP 136/86  Pulse 106  Temp(Src) 99.4 F (37.4 C) (Oral)  Resp 16  Ht 5\' 1"  (1.549 m)  Wt 140 lb (63.504 kg)  BMI 26.47 kg/m2  SpO2 99%  LMP 11/06/2013 Physical Exam  Nursing note and vitals reviewed. Constitutional: She is oriented to person, place, and time. She appears well-developed and well-nourished.  HENT:  Head: Normocephalic and atraumatic.  Right Ear: External ear normal.  Left Ear: External ear normal.  Mouth/Throat: Oropharynx is clear and moist. No oropharyngeal exudate.  Eyes: Conjunctivae and EOM are normal. Pupils are equal, round, and reactive to light.  Neck: Normal range of motion. Neck supple.  Normal range of motion of the neck w/out pain.  Cardiovascular: Normal rate, regular rhythm, normal heart sounds and intact distal pulses.  Exam reveals no gallop and no friction rub.   No murmur heard. Pulmonary/Chest: Effort normal and breath sounds normal. No respiratory distress. She has no wheezes.  Abdominal: Soft. Bowel sounds are normal. There is no tenderness. There is no rebound and no guarding.  Musculoskeletal: Normal range of motion. She exhibits no edema and no tenderness.  Neurological: She is alert and oriented to person, place, and time. She has normal strength. No cranial nerve deficit or sensory deficit. She displays a negative Romberg sign. Coordination and gait normal.  alert, oriented x3 speech: normal in context and clarity memory: intact grossly cranial nerves II-XII: intact motor strength: full proximally and distally no involuntary movements or tremors sensation: intact to  light touch diffusely  cerebellar: finger-to-nose and heel-to-shin intact gait: normal forwards and backwards, normal tandem gait   Skin: Skin is warm and dry.  Psychiatric: She has a normal mood and affect. Her behavior is normal.    ED Course  Procedures (including critical care time) Labs Review Labs Reviewed  PREGNANCY,  URINE   Imaging Review No results found.  EKG Interpretation   None       MDM   Final diagnoses:  Migraine    1:32 PM 43 y.o. female seen here recently for cough and low-grade fever who presents with a headache. The patient states that she has a history of migraines. She states that she awoke this morning and noticed that she had a right-sided retro-orbital aching headache consistent with previous migraines. She states that she has been getting over influenza during the last week and multiple family members have been sick. She notes a temperature of 100.3 at home this morning and has taken Tylenol at home for relief. She has an ongoing cough. Her headache is currently a 7/10. She denies any vomiting, vision changes. She is afebrile here and her VS are unremarkable here. She has a normal neurologic exam. Low suspicion for meningitis given recent flulike illness with cough and fevers. Also her symptoms are consistent with previous migraines and she has a normal neuro exam w/ normal rom of the neck. Will treat with a migraine cocktail.  2:45 PM: HA now 3/10 and pt feeling much better, she would like to go home.  I have discussed the diagnosis/risks/treatment options with the patient and believe the pt to be eligible for discharge home to follow-up with her pcp as needed. We also discussed returning to the ED immediately if new or worsening sx occur. We discussed the sx which are most concerning (e.g., worsening HA, ongoing fever, new sx such as vomiting/blurry vision) that necessitate immediate return. Medications administered to the patient during their visit and any new prescriptions provided to the patient are listed below.  Medications given during this visit Medications  dexamethasone (DECADRON) injection 10 mg (not administered)  sodium chloride 0.9 % bolus 1,000 mL (0 mLs Intravenous Stopped 12/02/13 1433)  metoCLOPramide (REGLAN) injection 5 mg (5 mg Intravenous Given 12/02/13 1346)   diphenhydrAMINE (BENADRYL) injection 25 mg (25 mg Intravenous Given 12/02/13 1345)    New Prescriptions   No medications on file     Blanchard Kelch, MD 12/02/13 1515

## 2013-12-02 NOTE — Discharge Instructions (Signed)
Migraine Headache A migraine headache is an intense, throbbing pain on one or both sides of your head. A migraine can last for 30 minutes to several hours. CAUSES  The exact cause of a migraine headache is not always known. However, a migraine may be caused when nerves in the brain become irritated and release chemicals that cause inflammation. This causes pain. Certain things may also trigger migraines, such as:  Alcohol.  Smoking.  Stress.  Menstruation.  Aged cheeses.  Foods or drinks that contain nitrates, glutamate, aspartame, or tyramine.  Lack of sleep.  Chocolate.  Caffeine.  Hunger.  Physical exertion.  Fatigue.  Medicines used to treat chest pain (nitroglycerine), birth control pills, estrogen, and some blood pressure medicines. SIGNS AND SYMPTOMS  Pain on one or both sides of your head.  Pulsating or throbbing pain.  Severe pain that prevents daily activities.  Pain that is aggravated by any physical activity.  Nausea, vomiting, or both.  Dizziness.  Pain with exposure to bright lights, loud noises, or activity.  General sensitivity to bright lights, loud noises, or smells. Before you get a migraine, you may get warning signs that a migraine is coming (aura). An aura may include:  Seeing flashing lights.  Seeing bright spots, halos, or zig-zag lines.  Having tunnel vision or blurred vision.  Having feelings of numbness or tingling.  Having trouble talking.  Having muscle weakness. DIAGNOSIS  A migraine headache is often diagnosed based on:  Symptoms.  Physical exam.  A CT scan or MRI of your head. These imaging tests cannot diagnose migraines, but they can help rule out other causes of headaches. TREATMENT Medicines may be given for pain and nausea. Medicines can also be given to help prevent recurrent migraines.  HOME CARE INSTRUCTIONS  Only take over-the-counter or prescription medicines for pain or discomfort as directed by your  health care provider. The use of long-term narcotics is not recommended.  Lie down in a dark, quiet room when you have a migraine.  Keep a journal to find out what may trigger your migraine headaches. For example, write down:  What you eat and drink.  How much sleep you get.  Any change to your diet or medicines.  Limit alcohol consumption.  Quit smoking if you smoke.  Get 7 9 hours of sleep, or as recommended by your health care provider.  Limit stress.  Keep lights dim if bright lights bother you and make your migraines worse. SEEK IMMEDIATE MEDICAL CARE IF:   Your migraine becomes severe.  You have a fever.  You have a stiff neck.  You have vision loss.  You have muscular weakness or loss of muscle control.  You start losing your balance or have trouble walking.  You feel faint or pass out.  You have severe symptoms that are different from your first symptoms. MAKE SURE YOU:   Understand these instructions.  Will watch your condition.  Will get help right away if you are not doing well or get worse. Document Released: 10/07/2005 Document Revised: 07/28/2013 Document Reviewed: 06/14/2013 [employer] Patient Information 2014 [employer].  

## 2013-12-02 NOTE — ED Notes (Signed)
Woke with migraine headache. Light sensitive.

## 2013-12-03 ENCOUNTER — Ambulatory Visit: Payer: 59 | Admitting: Rehabilitation

## 2013-12-06 ENCOUNTER — Ambulatory Visit: Payer: 59 | Admitting: Rehabilitation

## 2013-12-08 ENCOUNTER — Ambulatory Visit: Payer: 59 | Admitting: Rehabilitation

## 2013-12-09 NOTE — ED Provider Notes (Signed)
Medical screening examination/treatment/procedure(s) were performed by non-physician practitioner and as supervising physician I was immediately available for consultation/collaboration.  EKG Interpretation   None         Neta Ehlers, MD 12/09/13 2218

## 2013-12-10 ENCOUNTER — Ambulatory Visit: Payer: 59 | Admitting: Rehabilitation

## 2013-12-13 ENCOUNTER — Encounter (HOSPITAL_BASED_OUTPATIENT_CLINIC_OR_DEPARTMENT_OTHER): Payer: Self-pay | Admitting: Emergency Medicine

## 2013-12-13 ENCOUNTER — Ambulatory Visit: Payer: 59 | Admitting: Rehabilitation

## 2013-12-13 ENCOUNTER — Emergency Department (HOSPITAL_BASED_OUTPATIENT_CLINIC_OR_DEPARTMENT_OTHER)
Admission: EM | Admit: 2013-12-13 | Discharge: 2013-12-13 | Disposition: A | Discharge status: Home / Self Care | Payer: 59 | Attending: Emergency Medicine | Admitting: Emergency Medicine

## 2013-12-13 DIAGNOSIS — N390 Urinary tract infection, site not specified: Secondary | ICD-10-CM

## 2013-12-13 DIAGNOSIS — Z3202 Encounter for pregnancy test, result negative: Secondary | ICD-10-CM | POA: Insufficient documentation

## 2013-12-13 DIAGNOSIS — Z8679 Personal history of other diseases of the circulatory system: Secondary | ICD-10-CM | POA: Insufficient documentation

## 2013-12-13 DIAGNOSIS — R319 Hematuria, unspecified: Secondary | ICD-10-CM | POA: Insufficient documentation

## 2013-12-13 HISTORY — DX: Urinary tract infection, site not specified: N39.0

## 2013-12-13 LAB — URINALYSIS, ROUTINE W REFLEX MICROSCOPIC
BILIRUBIN URINE: NEGATIVE
Glucose, UA: NEGATIVE mg/dL
Ketones, ur: NEGATIVE mg/dL
Nitrite: NEGATIVE
PROTEIN: 100 mg/dL — AB
Specific Gravity, Urine: 1.024 (ref 1.005–1.030)
UROBILINOGEN UA: 1 mg/dL (ref 0.0–1.0)
pH: 6.5 (ref 5.0–8.0)

## 2013-12-13 LAB — URINE MICROSCOPIC-ADD ON

## 2013-12-13 LAB — PREGNANCY, URINE: Preg Test, Ur: NEGATIVE

## 2013-12-13 MED ORDER — NITROFURANTOIN MONOHYD MACRO 100 MG PO CAPS
100.0000 mg | ORAL_CAPSULE | Freq: Once | ORAL | Status: AC
  Administered 2013-12-13: 100 mg via ORAL
  Filled 2013-12-13: qty 1

## 2013-12-13 MED ORDER — PHENAZOPYRIDINE HCL 100 MG PO TABS
200.0000 mg | ORAL_TABLET | Freq: Once | ORAL | Status: AC
  Administered 2013-12-13: 200 mg via ORAL
  Filled 2013-12-13: qty 2

## 2013-12-13 MED ORDER — PHENAZOPYRIDINE HCL 200 MG PO TABS: 200.0000 mg | ORAL_TABLET | Freq: Three times a day (TID) | ORAL | Status: DC

## 2013-12-13 MED ORDER — NITROFURANTOIN MONOHYD MACRO 100 MG PO CAPS: 100.0000 mg | ORAL_CAPSULE | Freq: Two times a day (BID) | ORAL | Status: DC

## 2013-12-13 NOTE — ED Provider Notes (Signed)
CSN: [id number]     Arrival date & time 12/13/13  2029 History   First MD Initiated Contact with Patient 12/13/13 2300     Chief Complaint  Patient presents with  . Dysuria      HPI Comments: Dr. Legrand Como Dorm is  Her PCP. LMP was 1 week ago.   Patient is a 43 y.o. female presenting with dysuria. The history is provided by the patient. No language interpreter was used.  Dysuria Pain quality:  Burning Pain severity:  Severe Onset quality:  Gradual Duration:  1 day Timing:  Constant Progression:  Worsening Chronicity:  Recurrent Recent urinary tract infections: yes   Relieved by:  Nothing Worsened by:  Nothing tried Ineffective treatments:  Phenazopyridine Urinary symptoms: hematuria   Associated symptoms: no abdominal pain, no fever, no nausea and no vomiting   Risk factors: recurrent urinary tract infections     Past Medical History  Diagnosis Date  . Migraine   . Migraine   . UTI (lower urinary tract infection)    Past Surgical History  Procedure Laterality Date  . Cholecystectomy    . Rotator cuff repair     No family history on file. History  Substance Use Topics  . Smoking status: Never Smoker   . Smokeless tobacco: Not on file  . Alcohol Use: No   OB History   Grav Para Term Preterm Abortions TAB SAB Ect Mult Living                 Review of Systems  Constitutional: Negative for fever.  Gastrointestinal: Negative for nausea, vomiting and abdominal pain.  Genitourinary: Positive for dysuria. Negative for difficulty urinating.  All other systems reviewed and are negative.      Allergies  Midazolam hcl  Home Medications    BP 157/96  Pulse 76  Temp(Src) 98.5 F (36.9 C) (Oral)  Resp 20  Ht 5\' 1"  (1.549 m)  Wt 140 lb (63.504 kg)  BMI 26.47 kg/m2  SpO2 100%  LMP 12/06/2013  Physical Exam  Nursing note and vitals reviewed. Constitutional: She appears well-developed and well-nourished. No distress.  HENT:  Head: Normocephalic and  atraumatic.  Mouth/Throat: Oropharynx is clear and moist. No oropharyngeal exudate.  Eyes: Conjunctivae are normal. Pupils are equal, round, and reactive to light.  Neck: Normal range of motion. Neck supple. No tracheal deviation present.  Cardiovascular: Normal rate, regular rhythm and normal heart sounds.   Pulmonary/Chest: Effort normal and breath sounds normal. No respiratory distress. She has no wheezes. She has no rales.  Abdominal: Soft. Bowel sounds are normal. She exhibits no distension. There is no tenderness. There is no rebound and no guarding.  Musculoskeletal: Normal range of motion.  Neurological: She is alert.  Skin: Skin is warm and dry.  Psychiatric: She has a normal mood and affect. Her behavior is normal.    ED Course  Procedures (including critical care time) DIAGNOSTIC STUDIES: Oxygen Saturation is 100% on RA, normal by my interpretation.    COORDINATION OF CARE: 11:16 PM Discussed treatment plan which includes pregnancy screen, urinalysis, nitrofurantoin capsule, and pyridium tablet with pt at bedside and pt agreed to plan.    Labs Review Labs Reviewed  URINALYSIS, ROUTINE W REFLEX MICROSCOPIC - Abnormal; Notable for the following:    Color, Urine AMBER (*)    APPearance CLOUDY (*)    Hgb urine dipstick MODERATE (*)    Protein, ur 100 (*)    Leukocytes, UA SMALL (*)  All other components within normal limits  URINE MICROSCOPIC-ADD ON - Abnormal; Notable for the following:    Squamous Epithelial / LPF FEW (*)    Bacteria, UA FEW (*)    All other components within normal limits  PREGNANCY, URINE   Imaging Review No results found.  EKG Interpretation   None       MDM   Final diagnoses:  None    Will treat UTI with pyridium and macrobid.  Follow up with your family doctor for recheck in several days  I personally performed the services described in this documentation, which was scribed in my presence. The recorded information has been  reviewed and is accurate.     Carlisle Beers, MD 12/14/13 [phone]

## 2013-12-13 NOTE — ED Notes (Signed)
Pt c/o burning w urination onset last pm

## 2013-12-13 NOTE — ED Notes (Signed)
Painful urination since yesterday.  Sts there is "already blood in it."

## 2013-12-15 ENCOUNTER — Ambulatory Visit: Payer: 59 | Admitting: Rehabilitation

## 2013-12-17 ENCOUNTER — Ambulatory Visit: Payer: 59 | Admitting: Rehabilitation

## 2013-12-20 ENCOUNTER — Ambulatory Visit: Payer: 59 | Admitting: Rehabilitation

## 2013-12-22 ENCOUNTER — Ambulatory Visit: Payer: 59 | Admitting: Rehabilitation

## 2013-12-24 ENCOUNTER — Ambulatory Visit: Payer: 59 | Admitting: Rehabilitation

## 2014-03-16 ENCOUNTER — Emergency Department (HOSPITAL_BASED_OUTPATIENT_CLINIC_OR_DEPARTMENT_OTHER)
Admission: EM | Admit: 2014-03-16 | Discharge: 2014-03-16 | Disposition: A | Discharge status: Home / Self Care | Payer: 59 | Attending: Emergency Medicine | Admitting: Emergency Medicine

## 2014-03-16 ENCOUNTER — Encounter (HOSPITAL_BASED_OUTPATIENT_CLINIC_OR_DEPARTMENT_OTHER): Payer: Self-pay | Admitting: Emergency Medicine

## 2014-03-16 ENCOUNTER — Emergency Department (HOSPITAL_BASED_OUTPATIENT_CLINIC_OR_DEPARTMENT_OTHER): Payer: 59

## 2014-03-16 DIAGNOSIS — IMO0002 Reserved for concepts with insufficient information to code with codable children: Secondary | ICD-10-CM | POA: Insufficient documentation

## 2014-03-16 DIAGNOSIS — G43909 Migraine, unspecified, not intractable, without status migrainosus: Secondary | ICD-10-CM | POA: Diagnosis not present

## 2014-03-16 DIAGNOSIS — Z87440 Personal history of urinary (tract) infections: Secondary | ICD-10-CM | POA: Insufficient documentation

## 2014-03-16 DIAGNOSIS — R059 Cough, unspecified: Secondary | ICD-10-CM | POA: Diagnosis present

## 2014-03-16 DIAGNOSIS — Z792 Long term (current) use of antibiotics: Secondary | ICD-10-CM | POA: Diagnosis not present

## 2014-03-16 DIAGNOSIS — J069 Acute upper respiratory infection, unspecified: Secondary | ICD-10-CM | POA: Diagnosis not present

## 2014-03-16 DIAGNOSIS — Z79899 Other long term (current) drug therapy: Secondary | ICD-10-CM | POA: Diagnosis not present

## 2014-03-16 DIAGNOSIS — Z791 Long term (current) use of non-steroidal anti-inflammatories (NSAID): Secondary | ICD-10-CM | POA: Insufficient documentation

## 2014-03-16 DIAGNOSIS — R05 Cough: Secondary | ICD-10-CM | POA: Diagnosis present

## 2014-03-16 LAB — RAPID STREP SCREEN (MED CTR MEBANE ONLY): Streptococcus, Group A Screen (Direct): NEGATIVE

## 2014-03-16 MED ORDER — HYDROCOD POLST-CHLORPHEN POLST 10-8 MG/5ML PO LQCR: 5.0000 mL | Freq: Two times a day (BID) | ORAL | Status: DC | PRN

## 2014-03-16 NOTE — ED Provider Notes (Signed)
Medical screening examination/treatment/procedure(s) were performed by non-physician practitioner and as supervising physician I was immediately available for consultation/collaboration.   EKG Interpretation None        Blanchie Dessert, MD 03/16/14 2328

## 2014-03-16 NOTE — ED Notes (Signed)
C/o generalized body aches, sorethroat cough, lower grade fever and congestion onset yesterday

## 2014-03-16 NOTE — ED Notes (Signed)
C/o nonprod cough, fever, body aches started last night

## 2014-03-16 NOTE — ED Provider Notes (Signed)
CSN: [id number]     Arrival date & time 03/16/14  1935 History   First MD Initiated Contact with Patient 03/16/14 2110     Chief Complaint  Patient presents with  . URI     (Consider location/radiation/quality/duration/timing/severity/associated sxs/prior Treatment) Patient is a 43 y.o. female presenting with URI. The history is provided by the patient. No language interpreter was used.  URI Presenting symptoms: cough, fever and sore throat   Severity:  Moderate Onset quality:  Sudden Duration:  1 day Timing:  Constant Progression:  Unchanged Chronicity:  New Relieved by:  Nothing Worsened by:  Nothing tried Ineffective treatments:  None tried Associated symptoms: no headaches     Past Medical History  Diagnosis Date  . Migraine   . Migraine   . UTI (lower urinary tract infection)    Past Surgical History  Procedure Laterality Date  . Cholecystectomy    . Rotator cuff repair     No family history on file. History  Substance Use Topics  . Smoking status: Never Smoker   . Smokeless tobacco: Not on file  . Alcohol Use: No   OB History   Grav Para Term Preterm Abortions TAB SAB Ect Mult Living                 Review of Systems  Constitutional: Positive for fever.  HENT: Positive for sore throat.   Respiratory: Positive for cough.   Neurological: Negative for headaches.      Allergies  Midazolam hcl  Home Medications   Prior to Admission medications   Medication Sig Start Date End Date Taking? Authorizing Provider  aspirin-acetaminophen-caffeine (EXCEDRIN MIGRAINE) [phone] MG per tablet Take 2 tablets by mouth every 6 (six) hours as needed. For migraines    Historical Provider, MD  cephALEXin (KEFLEX) 500 MG capsule Take 1 capsule (500 mg total) by mouth 4 (four) times daily. 06/10/13   Fransico Meadow, PA-C  cyclobenzaprine (FLEXERIL) 5 MG tablet Take 5 mg by mouth 3 (three) times daily as needed for muscle spasms.    Historical Provider, MD  diazepam  (VALIUM) 5 MG tablet Take 1 tablet (5 mg total) by mouth 2 (two) times daily. 11/26/13   Elisha Headland, NP  dicyclomine (BENTYL) 20 MG tablet Take 1 tablet (20 mg total) by mouth 2 (two) times daily. As needed for abdominal cramping 11/04/13   Kristen N Ward, DO  HYDROcodone-acetaminophen (NORCO) 5-325 MG per tablet Take 1 tablet by mouth every 6 (six) hours as needed for severe pain. 09/14/13   Orlie Dakin, MD  HYDROcodone-acetaminophen (NORCO/VICODIN) 5-325 MG per tablet Take 2 tablets by mouth every 4 (four) hours as needed for pain. 06/10/13   Fransico Meadow, PA-C  ibuprofen (ADVIL,MOTRIN) 800 MG tablet Take 1 tablet (800 mg total) by mouth every 8 (eight) hours as needed for pain. 10/27/12   Charles B. Karle Starch, MD  loperamide (IMODIUM A-D) 2 MG tablet Take 1 tablet (2 mg total) by mouth 4 (four) times daily as needed for diarrhea or loose stools. 10/25/12   Fredia Sorrow, MD  meloxicam (MOBIC) 7.5 MG tablet Take 1 tablet (7.5 mg total) by mouth daily. 11/07/13   April K Palumbo-Rasch, MD  methocarbamol (ROBAXIN) 500 MG tablet Take 1 tablet (500 mg total) by mouth 2 (two) times daily. 11/07/13   April K Palumbo-Rasch, MD  nitrofurantoin, macrocrystal-monohydrate, (MACROBID) 100 MG capsule Take 1 capsule (100 mg total) by mouth 2 (two) times daily. X 7 days 12/13/13  April K Palumbo-Rasch, MD  ondansetron (ZOFRAN ODT) 4 MG disintegrating tablet Take 1 tablet (4 mg total) by mouth every 8 (eight) hours as needed for nausea. 10/25/12   Fredia Sorrow, MD  ondansetron (ZOFRAN) 4 MG tablet Take 1 tablet (4 mg total) by mouth every 6 (six) hours. 11/04/13   Kristen N Ward, DO  oxyCODONE-acetaminophen (PERCOCET/ROXICET) 5-325 MG per tablet Take 1-2 tablets by mouth every 6 (six) hours as needed for pain. 10/27/12   Charles B. Karle Starch, MD  oxyCODONE-acetaminophen (PERCOCET/ROXICET) 5-325 MG per tablet Take 2 tablets by mouth every 4 (four) hours as needed for severe pain. 11/26/13   Elisha Headland, NP  phenazopyridine  (PYRIDIUM) 200 MG tablet Take 1 tablet (200 mg total) by mouth 3 (three) times daily. 06/10/13   Fransico Meadow, PA-C  phenazopyridine (PYRIDIUM) 200 MG tablet Take 1 tablet (200 mg total) by mouth 3 (three) times daily. 12/13/13   April K Palumbo-Rasch, MD  potassium chloride SA (K-DUR,KLOR-CON) 20 MEQ tablet Take 1 tablet (20 mEq total) by mouth 2 (two) times daily. 10/25/12   Fredia Sorrow, MD  predniSONE (DELTASONE) 20 MG tablet 3 tabs po day one, then 2 po daily x 4 days 11/07/13   April K Palumbo-Rasch, MD  Prenatal Vit-Fe Fumarate-FA (MULTIVITAMIN-PRENATAL) 27-0.8 MG TABS Take 1 tablet by mouth daily.    Historical Provider, MD  Prenatal Vit-Fe Fumarate-FA (PRENATAL MULTIVITAMIN) TABS Take 1 tablet by mouth daily at 12 noon.    Historical Provider, MD  promethazine (PHENERGAN) 25 MG tablet Take 1 tablet (25 mg total) by mouth every 6 (six) hours as needed for nausea. 10/25/12   Fredia Sorrow, MD  propranolol (INDERAL) 80 MG tablet Take 80 mg by mouth daily.    Historical Provider, MD  [place] Name:     Historical Provider, MD  UNABLE TO FIND B/p med for migraine but not sure of the name    Historical Provider, MD   BP 150/89  Pulse 110  Temp(Src) 99.5 F (37.5 C) (Oral)  Resp 16  Ht 5\' 1"  (1.549 m)  Wt 140 lb (63.504 kg)  BMI 26.47 kg/m2  SpO2 100%  LMP 02/28/2014 Physical Exam  Nursing note and vitals reviewed. Constitutional: She is oriented to person, place, and time. She appears well-developed and well-nourished.  HENT:  Right Ear: External ear normal.  Left Ear: External ear normal.  Mouth/Throat: Posterior oropharyngeal erythema present.  Neck: Normal range of motion. Neck supple.  Cardiovascular: Normal rate and regular rhythm.   Pulmonary/Chest: Effort normal and breath sounds normal.  Abdominal: Soft. Bowel sounds are normal. There is no tenderness.  Musculoskeletal: Normal range of motion.  Neurological: She is alert and oriented to person, place, and time.   Skin: Skin is warm and dry.  Psychiatric: She has a normal mood and affect.    ED Course  Procedures (including critical care time) Labs Review Labs Reviewed  RAPID STREP SCREEN  CULTURE, GROUP A STREP    Imaging Review No results found.   EKG Interpretation None      MDM   Final diagnoses:  URI (upper respiratory infection)    Pt non septic in appearance. Will treat with cough syrup.     Glendell Docker, NP 03/16/14 2203

## 2014-03-16 NOTE — Discharge Instructions (Signed)

## 2014-03-18 LAB — CULTURE, GROUP A STREP

## 2014-07-06 ENCOUNTER — Encounter (HOSPITAL_BASED_OUTPATIENT_CLINIC_OR_DEPARTMENT_OTHER): Payer: Self-pay | Admitting: Emergency Medicine

## 2014-07-06 ENCOUNTER — Emergency Department (HOSPITAL_BASED_OUTPATIENT_CLINIC_OR_DEPARTMENT_OTHER)
Admission: EM | Admit: 2014-07-06 | Discharge: 2014-07-06 | Disposition: A | Discharge status: Home / Self Care | Payer: 59 | Attending: Emergency Medicine | Admitting: Emergency Medicine

## 2014-07-06 ENCOUNTER — Emergency Department (HOSPITAL_BASED_OUTPATIENT_CLINIC_OR_DEPARTMENT_OTHER): Payer: 59

## 2014-07-06 DIAGNOSIS — M25539 Pain in unspecified wrist: Secondary | ICD-10-CM | POA: Insufficient documentation

## 2014-07-06 DIAGNOSIS — Z87440 Personal history of urinary (tract) infections: Secondary | ICD-10-CM | POA: Diagnosis not present

## 2014-07-06 DIAGNOSIS — Z8679 Personal history of other diseases of the circulatory system: Secondary | ICD-10-CM | POA: Insufficient documentation

## 2014-07-06 DIAGNOSIS — M25531 Pain in right wrist: Secondary | ICD-10-CM

## 2014-07-06 MED ORDER — IBUPROFEN 800 MG PO TABS: 800.0000 mg | ORAL_TABLET | Freq: Three times a day (TID) | ORAL | Status: DC

## 2014-07-06 NOTE — Discharge Instructions (Signed)

## 2014-07-06 NOTE — ED Provider Notes (Signed)
CSN: [id number]     Arrival date & time 07/06/14  2145 History  This chart was scribed for Annette Essex, MD by Einar Pheasant, ED Scribe. This patient was seen in room MH03/MH03 and the patient's care was started at 10:41 PM.  Chief Complaint  Patient presents with  . Wrist Pain   HPI HPI Comments: [name] is a 43 y.o. female who presents to the Emergency Department complaining of worsening right wrist pain that started 1 week ago. She states that the pain is localized to the outside of her wrist. Pt reports taking Ibuprofen, with last dose PTA. She also endorses associated numbness yesterday, but it is not present today. Pt states that she types a lot at work. Denies any elbow pain, left wrist pain, tingling, SOB, chest pain, fever, joint swelling, or congestion.  PCP: Lenise Herald, MD   Past Medical History  Diagnosis Date  . Migraine   . Migraine   . UTI (lower urinary tract infection)    Past Surgical History  Procedure Laterality Date  . Cholecystectomy    . Rotator cuff repair     History reviewed. No pertinent family history. History  Substance Use Topics  . Smoking status: Never Smoker   . Smokeless tobacco: Not on file  . Alcohol Use: No   OB History   Grav Para Term Preterm Abortions TAB SAB Ect Mult Living                 Review of Systems A complete 10 system review of systems was obtained and all systems are negative except as noted in the HPI and PMH.    Allergies  Midazolam hcl  Home Medications    Triage vitals:BP 167/104  Pulse 88  Temp(Src) 98.3 F (36.8 C) (Oral)  Resp 18  Wt 140 lb (63.504 kg)  SpO2 100%  LMP 06/28/2014  Physical Exam  Nursing note and vitals reviewed. Constitutional: She is oriented to person, place, and time. She appears well-developed and well-nourished. No distress.  HENT:  Head: Normocephalic and atraumatic.  Mouth/Throat: Oropharynx is clear and moist. No oropharyngeal exudate.  Eyes: Conjunctivae and  EOM are normal. Pupils are equal, round, and reactive to light.  Neck: Normal range of motion. Neck supple.  No meningismus.  Cardiovascular: Normal rate, regular rhythm, normal heart sounds and intact distal pulses.   No murmur heard. Pulmonary/Chest: Effort normal and breath sounds normal. No respiratory distress.  Abdominal: Soft. There is no tenderness. There is no rebound and no guarding.  Musculoskeletal: Normal range of motion. She exhibits no edema and no tenderness.  Tenderness over right ulnar wrist. FROM of wrist and fingers. Intact radial pulse. Tinel's sign negative. Phalen's test negative.   Neurological: She is alert and oriented to person, place, and time. No cranial nerve deficit. She exhibits normal muscle tone. Coordination normal.  No ataxia on finger to nose bilaterally. No pronator drift. 5/5 strength throughout. CN 2-12 intact. Negative Romberg. Equal grip strength. Sensation intact. Gait is normal.   Skin: Skin is warm.  Psychiatric: She has a normal mood and affect. Her behavior is normal.    ED Course  Procedures (including critical care time)  DIAGNOSTIC STUDIES: Oxygen Saturation is 100% on RA, normal by my interpretation.    COORDINATION OF CARE: 10:48 PM- Will prescribe pain medication. Will give pt referral to on-call hand specialist. Pt advised of plan for treatment and pt agrees.  Imaging Review Dg Wrist Complete Right  07/06/2014  CLINICAL DATA:  Right wrist pain for 1 week.  EXAM: RIGHT WRIST - COMPLETE 3+ VIEW  COMPARISON:  None.  FINDINGS: There is no evidence of fracture or dislocation. The carpal rows are intact, and demonstrate normal alignment. The joint spaces are preserved.  No significant soft tissue abnormalities are seen.  IMPRESSION: No evidence of fracture or dislocation.   Electronically Signed   By: Garald Balding M.D.   On: 07/06/2014 22:29    MDM   Final diagnoses:  Right wrist pain   one-week history of right wrist pain without  trauma. Patient works at a desk with extensive typing. Denies any fevers, chills, weakness, numbness or tingling. No symptoms in any other joints.  X-ray obtained in triage is negative. No paresthesias on palpation of medial nerve.  Suspect repetitive motion injury from typing. Patient was given splint, anti-inflammatories, reduction in repetitive activities, followup with PCP.   I personally performed the services described in this documentation, which was scribed in my presence. The recorded information has been reviewed and is accurate.     Annette Essex, MD 07/07/14 0001

## 2014-07-06 NOTE — ED Notes (Signed)
Patient states that she started to her right wrist about 1 week ago.

## 2014-09-28 ENCOUNTER — Encounter (HOSPITAL_BASED_OUTPATIENT_CLINIC_OR_DEPARTMENT_OTHER): Payer: Self-pay | Admitting: *Deleted

## 2014-09-28 ENCOUNTER — Emergency Department (HOSPITAL_BASED_OUTPATIENT_CLINIC_OR_DEPARTMENT_OTHER)
Admission: EM | Admit: 2014-09-28 | Discharge: 2014-09-28 | Disposition: A | Discharge status: Home / Self Care | Payer: 59 | Attending: Emergency Medicine | Admitting: Emergency Medicine

## 2014-09-28 DIAGNOSIS — G43909 Migraine, unspecified, not intractable, without status migrainosus: Secondary | ICD-10-CM | POA: Insufficient documentation

## 2014-09-28 DIAGNOSIS — Z79899 Other long term (current) drug therapy: Secondary | ICD-10-CM | POA: Insufficient documentation

## 2014-09-28 DIAGNOSIS — Z7952 Long term (current) use of systemic steroids: Secondary | ICD-10-CM | POA: Diagnosis not present

## 2014-09-28 DIAGNOSIS — J020 Streptococcal pharyngitis: Secondary | ICD-10-CM

## 2014-09-28 DIAGNOSIS — Z7982 Long term (current) use of aspirin: Secondary | ICD-10-CM | POA: Insufficient documentation

## 2014-09-28 DIAGNOSIS — Z791 Long term (current) use of non-steroidal anti-inflammatories (NSAID): Secondary | ICD-10-CM | POA: Insufficient documentation

## 2014-09-28 DIAGNOSIS — Z792 Long term (current) use of antibiotics: Secondary | ICD-10-CM | POA: Diagnosis not present

## 2014-09-28 DIAGNOSIS — J029 Acute pharyngitis, unspecified: Secondary | ICD-10-CM | POA: Diagnosis present

## 2014-09-28 DIAGNOSIS — Z87440 Personal history of urinary (tract) infections: Secondary | ICD-10-CM | POA: Insufficient documentation

## 2014-09-28 LAB — RAPID STREP SCREEN (MED CTR MEBANE ONLY): Streptococcus, Group A Screen (Direct): POSITIVE — AB

## 2014-09-28 MED ORDER — HYDROCODONE-ACETAMINOPHEN 7.5-325 MG/15ML PO SOLN
10.0000 mL | Freq: Once | ORAL | Status: AC
  Administered 2014-09-28: 10 mL via ORAL
  Filled 2014-09-28: qty 15

## 2014-09-28 MED ORDER — PENICILLIN G BENZATHINE 1200000 UNIT/2ML IM SUSP
1.2000 10*6.[IU] | Freq: Once | INTRAMUSCULAR | Status: AC
  Administered 2014-09-28: 1.2 10*6.[IU] via INTRAMUSCULAR
  Filled 2014-09-28: qty 2

## 2014-09-28 MED ORDER — HYDROCODONE-ACETAMINOPHEN 7.5-325 MG/15ML PO SOLN
15.0000 mL | Freq: Three times a day (TID) | ORAL | Status: DC | PRN
Start: 2014-09-28 — End: 2014-11-24

## 2014-09-28 NOTE — Discharge Instructions (Signed)

## 2014-09-28 NOTE — ED Notes (Signed)
rx x 1 given for hycet and work note- d/c home with ride

## 2014-09-28 NOTE — ED Provider Notes (Signed)
CSN: [id number]     Arrival date & time 09/28/14  7628 History   First MD Initiated Contact with Patient 09/28/14 [phone]     Chief Complaint  Patient presents with  . Sore Throat     (Consider location/radiation/quality/duration/timing/severity/associated sxs/prior Treatment) HPI Comments: 43 year old female, presents with 2 days of sore throat, gradual in onset, persistent, is now severe and associated with fevers as high as 103 at home. She has been taking Tylenol with some relief, denies coughing, swelling, vomiting. No history of strep throat or tonsillitis, no sick contacts.  The history is provided by the patient.    Past Medical History  Diagnosis Date  . Migraine   . Migraine   . UTI (lower urinary tract infection)    Past Surgical History  Procedure Laterality Date  . Cholecystectomy    . Rotator cuff repair     No family history on file. History  Substance Use Topics  . Smoking status: Never Smoker   . Smokeless tobacco: Not on file  . Alcohol Use: No   OB History    No data available     Review of Systems  All other systems reviewed and are negative.     Allergies  Midazolam hcl  Home Medications   Prior to Admission medications   Medication Sig Start Date End Date Taking? Authorizing Provider  aspirin-acetaminophen-caffeine (EXCEDRIN MIGRAINE) [phone] MG per tablet Take 2 tablets by mouth every 6 (six) hours as needed. For migraines    Historical Provider, MD  cephALEXin (KEFLEX) 500 MG capsule Take 1 capsule (500 mg total) by mouth 4 (four) times daily. 06/10/13   Fransico Meadow, PA-C  chlorpheniramine-HYDROcodone [facility]) 10-8 MG/5ML LQCR Take 5 mLs by mouth every 12 (twelve) hours as needed for cough. 03/16/14   Glendell Docker, NP  cyclobenzaprine (FLEXERIL) 5 MG tablet Take 5 mg by mouth 3 (three) times daily as needed for muscle spasms.    Historical Provider, MD  diazepam (VALIUM) 5 MG tablet Take 1 tablet (5 mg total) by  mouth 2 (two) times daily. 11/26/13   Elisha Headland, NP  dicyclomine (BENTYL) 20 MG tablet Take 1 tablet (20 mg total) by mouth 2 (two) times daily. As needed for abdominal cramping 11/04/13   Kristen N Ward, DO  HYDROcodone-acetaminophen (NORCO) 5-325 MG per tablet Take 1 tablet by mouth every 6 (six) hours as needed for severe pain. 09/14/13   Orlie Dakin, MD  HYDROcodone-acetaminophen (NORCO/VICODIN) 5-325 MG per tablet Take 2 tablets by mouth every 4 (four) hours as needed for pain. 06/10/13   Fransico Meadow, PA-C  ibuprofen (ADVIL,MOTRIN) 800 MG tablet Take 1 tablet (800 mg total) by mouth every 8 (eight) hours as needed for pain. 10/27/12   Charles B. Karle Starch, MD  ibuprofen (ADVIL,MOTRIN) 800 MG tablet Take 1 tablet (800 mg total) by mouth 3 (three) times daily. 07/06/14   [name], MD  loperamide (IMODIUM A-D) 2 MG tablet Take 1 tablet (2 mg total) by mouth 4 (four) times daily as needed for diarrhea or loose stools. 10/25/12   Fredia Sorrow, MD  meloxicam (MOBIC) 7.5 MG tablet Take 1 tablet (7.5 mg total) by mouth daily. 11/07/13   April K Palumbo-Rasch, MD  methocarbamol (ROBAXIN) 500 MG tablet Take 1 tablet (500 mg total) by mouth 2 (two) times daily. 11/07/13   April K Palumbo-Rasch, MD  nitrofurantoin, macrocrystal-monohydrate, (MACROBID) 100 MG capsule Take 1 capsule (100 mg total) by mouth 2 (two) times daily. X  7 days 12/13/13   April K Palumbo-Rasch, MD  ondansetron (ZOFRAN ODT) 4 MG disintegrating tablet Take 1 tablet (4 mg total) by mouth every 8 (eight) hours as needed for nausea. 10/25/12   Fredia Sorrow, MD  ondansetron (ZOFRAN) 4 MG tablet Take 1 tablet (4 mg total) by mouth every 6 (six) hours. 11/04/13   Kristen N Ward, DO  oxyCODONE-acetaminophen (PERCOCET/ROXICET) 5-325 MG per tablet Take 1-2 tablets by mouth every 6 (six) hours as needed for pain. 10/27/12   Charles B. Karle Starch, MD  oxyCODONE-acetaminophen (PERCOCET/ROXICET) 5-325 MG per tablet Take 2 tablets by mouth every 4  (four) hours as needed for severe pain. 11/26/13   Elisha Headland, NP  phenazopyridine (PYRIDIUM) 200 MG tablet Take 1 tablet (200 mg total) by mouth 3 (three) times daily. 06/10/13   Fransico Meadow, PA-C  phenazopyridine (PYRIDIUM) 200 MG tablet Take 1 tablet (200 mg total) by mouth 3 (three) times daily. 12/13/13   April K Palumbo-Rasch, MD  potassium chloride SA (K-DUR,KLOR-CON) 20 MEQ tablet Take 1 tablet (20 mEq total) by mouth 2 (two) times daily. 10/25/12   Fredia Sorrow, MD  predniSONE (DELTASONE) 20 MG tablet 3 tabs po day one, then 2 po daily x 4 days 11/07/13   April K Palumbo-Rasch, MD  Prenatal Vit-Fe Fumarate-FA (MULTIVITAMIN-PRENATAL) 27-0.8 MG TABS Take 1 tablet by mouth daily.    Historical Provider, MD  Prenatal Vit-Fe Fumarate-FA (PRENATAL MULTIVITAMIN) TABS Take 1 tablet by mouth daily at 12 noon.    Historical Provider, MD  promethazine (PHENERGAN) 25 MG tablet Take 1 tablet (25 mg total) by mouth every 6 (six) hours as needed for nausea. 10/25/12   Fredia Sorrow, MD  propranolol (INDERAL) 80 MG tablet Take 80 mg by mouth daily.    Historical Provider, MD  UNABLE TO FIND Med Name: **    Historical Provider, MD  UNABLE TO FIND B/p med for migraine but not sure of the name    Historical Provider, MD   BP 132/79 mmHg  Pulse 104  Temp(Src) 99.2 F (37.3 C) (Oral)  Resp 16  Ht 5\' 4"  (1.626 m)  Wt 140 lb (63.504 kg)  BMI 24.02 kg/m2  SpO2 99%  LMP 09/17/2014 Physical Exam  Constitutional: She appears well-developed and well-nourished. No distress.  HENT:  Head: Normocephalic and atraumatic.  Mouth/Throat: Oropharynx is clear and moist. No oropharyngeal exudate.  Pharynx is erythematous with bilateral tonsillar exudate, erythema but no hypertrophy or asymmetry, uvula is midline.  Eyes: Conjunctivae and EOM are normal. Pupils are equal, round, and reactive to light. Right eye exhibits no discharge. Left eye exhibits no discharge. No scleral icterus.  Neck: Normal range of motion.  Neck supple. No JVD present. No thyromegaly present.  Cardiovascular: Normal rate, regular rhythm, normal heart sounds and intact distal pulses.  Exam reveals no gallop and no friction rub.   No murmur heard. Pulmonary/Chest: Effort normal and breath sounds normal. No respiratory distress. She has no wheezes. She has no rales.  Abdominal: Soft. Bowel sounds are normal. She exhibits no distension and no mass. There is no tenderness.  No hepatosplenomegaly or abdominal tenderness  Musculoskeletal: Normal range of motion. She exhibits no edema or tenderness.  Lymphadenopathy:    She has no cervical adenopathy.  Neurological: She is alert. Coordination normal.  Skin: Skin is warm and dry. No rash noted. No erythema.  Psychiatric: She has a normal mood and affect. Her behavior is normal.  Nursing note and vitals reviewed.  ED Course  Procedures (including critical care time) Labs Review Labs Reviewed  RAPID STREP SCREEN - Abnormal; Notable for the following:    Streptococcus, Group A Screen (Direct) POSITIVE (*)    All other components within normal limits    Imaging Review No results found.    MDM   Final diagnoses:  Strep pharyngitis    Exudative pharyngitis, She will need a strep test, I have performed a strep swab, sent to lab, vital signs rather unremarkable, the patient is otherwise healthy, tolerating fluids though with pain. Liquid Vicodin given.  Strep test positive, patient given liquid Vicodin with good improvement, vital signs stable, informed of results, encouraged to use Tylenol and ibuprofen as needed, intramuscular penicillin requested rather than oral amoxicillin, patient is aware of indications for follow-up.  Meds given in ED:  Medications  penicillin g benzathine (BICILLIN LA) 1200000 UNIT/2ML injection 1.2 Million Units (not administered)  HYDROcodone-acetaminophen (HYCET) 7.5-325 mg/15 ml solution 10 mL (10 mLs Oral Given 09/28/14 0916)    New  Prescriptions   HYDROCODONE-ACETAMINOPHEN (HYCET) 7.5-325 MG/15 ML SOLUTION    Take 15 mLs by mouth every 8 (eight) hours as needed for moderate pain.      Johnna Acosta, MD 09/28/14 [phone]

## 2014-09-28 NOTE — ED Notes (Signed)
Sore throat x 2 days

## 2014-11-24 ENCOUNTER — Encounter (HOSPITAL_BASED_OUTPATIENT_CLINIC_OR_DEPARTMENT_OTHER): Payer: Self-pay | Admitting: *Deleted

## 2014-11-24 ENCOUNTER — Emergency Department (HOSPITAL_BASED_OUTPATIENT_CLINIC_OR_DEPARTMENT_OTHER)
Admission: EM | Admit: 2014-11-24 | Discharge: 2014-11-24 | Disposition: A | Discharge status: Home / Self Care | Payer: Worker's Compensation | Attending: Emergency Medicine | Admitting: Emergency Medicine

## 2014-11-24 DIAGNOSIS — Z87440 Personal history of urinary (tract) infections: Secondary | ICD-10-CM | POA: Diagnosis not present

## 2014-11-24 DIAGNOSIS — Z79899 Other long term (current) drug therapy: Secondary | ICD-10-CM | POA: Diagnosis not present

## 2014-11-24 DIAGNOSIS — S39012A Strain of muscle, fascia and tendon of lower back, initial encounter: Secondary | ICD-10-CM

## 2014-11-24 DIAGNOSIS — X58XXXA Exposure to other specified factors, initial encounter: Secondary | ICD-10-CM | POA: Insufficient documentation

## 2014-11-24 DIAGNOSIS — Y998 Other external cause status: Secondary | ICD-10-CM | POA: Insufficient documentation

## 2014-11-24 DIAGNOSIS — Z7952 Long term (current) use of systemic steroids: Secondary | ICD-10-CM | POA: Diagnosis not present

## 2014-11-24 DIAGNOSIS — I10 Essential (primary) hypertension: Secondary | ICD-10-CM | POA: Diagnosis not present

## 2014-11-24 DIAGNOSIS — G43909 Migraine, unspecified, not intractable, without status migrainosus: Secondary | ICD-10-CM | POA: Diagnosis not present

## 2014-11-24 DIAGNOSIS — Y9289 Other specified places as the place of occurrence of the external cause: Secondary | ICD-10-CM | POA: Diagnosis not present

## 2014-11-24 DIAGNOSIS — Z791 Long term (current) use of non-steroidal anti-inflammatories (NSAID): Secondary | ICD-10-CM | POA: Insufficient documentation

## 2014-11-24 DIAGNOSIS — Y9389 Activity, other specified: Secondary | ICD-10-CM | POA: Insufficient documentation

## 2014-11-24 DIAGNOSIS — M545 Low back pain: Secondary | ICD-10-CM | POA: Diagnosis present

## 2014-11-24 HISTORY — DX: Essential (primary) hypertension: I10

## 2014-11-24 MED ORDER — HYDROCODONE-ACETAMINOPHEN 5-325 MG PO TABS: 1.0000 | ORAL_TABLET | ORAL | Status: DC | PRN

## 2014-11-24 MED ORDER — HYDROMORPHONE HCL 1 MG/ML IJ SOLN: 1.0000 mg | Freq: Once | INTRAMUSCULAR | Status: DC

## 2014-11-24 MED ORDER — HYDROMORPHONE HCL 1 MG/ML IJ SOLN
1.0000 mg | Freq: Once | INTRAMUSCULAR | Status: AC
  Administered 2014-11-24: 1 mg via INTRAMUSCULAR
  Filled 2014-11-24: qty 1

## 2014-11-24 MED ORDER — IBUPROFEN 800 MG PO TABS: 800.0000 mg | ORAL_TABLET | Freq: Three times a day (TID) | ORAL | Status: DC | PRN

## 2014-11-24 MED ORDER — CYCLOBENZAPRINE HCL 10 MG PO TABS: 5.0000 mg | ORAL_TABLET | Freq: Three times a day (TID) | ORAL | Status: DC | PRN

## 2014-11-24 NOTE — ED Notes (Signed)
Pt reports back pain after lifting her desk to reach the back of her computer- pt works from home

## 2014-11-24 NOTE — ED Notes (Signed)
MD at bedside. 

## 2014-11-24 NOTE — ED Provider Notes (Signed)
CSN: [id number]     Arrival date & time 11/24/14  1223 History   First MD Initiated Contact with Patient 11/24/14 1300     Chief Complaint  Patient presents with  . Back Pain     HPI Patient was trying to lift her desk to reach the back of her computer. Patient acutely felt pain in her lower back.  The pain is sharp and severe. It increases with movement. She denies any numbness or weakness. No radicular pain. No abdominal pain or dysuria. No numbness or weakness. Past Medical History  Diagnosis Date  . Migraine   . Migraine   . UTI (lower urinary tract infection)   . Hypertension    Past Surgical History  Procedure Laterality Date  . Cholecystectomy    . Rotator cuff repair     No family history on file. History  Substance Use Topics  . Smoking status: Never Smoker   . Smokeless tobacco: Never Used  . Alcohol Use: No   OB History    No data available     Review of Systems  All other systems reviewed and are negative.     Allergies  Midazolam hcl  Home Medications   Prior to Admission medications   Medication Sig Start Date End Date Taking? Authorizing Provider  propranolol (INDERAL) 80 MG tablet Take 80 mg by mouth daily.   Yes Historical Provider, MD  aspirin-acetaminophen-caffeine (EXCEDRIN MIGRAINE) [phone] MG per tablet Take 2 tablets by mouth every 6 (six) hours as needed. For migraines    Historical Provider, MD  cephALEXin (KEFLEX) 500 MG capsule Take 1 capsule (500 mg total) by mouth 4 (four) times daily. 06/10/13   Fransico Meadow, PA-C  chlorpheniramine-HYDROcodone [facility]) 10-8 MG/5ML LQCR Take 5 mLs by mouth every 12 (twelve) hours as needed for cough. 03/16/14   Glendell Docker, NP  cyclobenzaprine (FLEXERIL) 5 MG tablet Take 5 mg by mouth 3 (three) times daily as needed for muscle spasms.    Historical Provider, MD  diazepam (VALIUM) 5 MG tablet Take 1 tablet (5 mg total) by mouth 2 (two) times daily. 11/26/13   Elisha Headland, NP   dicyclomine (BENTYL) 20 MG tablet Take 1 tablet (20 mg total) by mouth 2 (two) times daily. As needed for abdominal cramping 11/04/13   Kristen N Ward, DO  HYDROcodone-acetaminophen (HYCET) 7.5-325 mg/15 ml solution Take 15 mLs by mouth every 8 (eight) hours as needed for moderate pain. 09/28/14   Johnna Acosta, MD  ibuprofen (ADVIL,MOTRIN) 800 MG tablet Take 1 tablet (800 mg total) by mouth every 8 (eight) hours as needed for pain. 10/27/12   Charles B. Karle Starch, MD  ibuprofen (ADVIL,MOTRIN) 800 MG tablet Take 1 tablet (800 mg total) by mouth 3 (three) times daily. 07/06/14   [name], MD  loperamide (IMODIUM A-D) 2 MG tablet Take 1 tablet (2 mg total) by mouth 4 (four) times daily as needed for diarrhea or loose stools. 10/25/12   Fredia Sorrow, MD  meloxicam (MOBIC) 7.5 MG tablet Take 1 tablet (7.5 mg total) by mouth daily. 11/07/13   April K Palumbo-Rasch, MD  methocarbamol (ROBAXIN) 500 MG tablet Take 1 tablet (500 mg total) by mouth 2 (two) times daily. 11/07/13   April K Palumbo-Rasch, MD  nitrofurantoin, macrocrystal-monohydrate, (MACROBID) 100 MG capsule Take 1 capsule (100 mg total) by mouth 2 (two) times daily. X 7 days 12/13/13   April K Palumbo-Rasch, MD  ondansetron (ZOFRAN ODT) 4 MG disintegrating tablet Take  1 tablet (4 mg total) by mouth every 8 (eight) hours as needed for nausea. 10/25/12   Fredia Sorrow, MD  ondansetron (ZOFRAN) 4 MG tablet Take 1 tablet (4 mg total) by mouth every 6 (six) hours. 11/04/13   Kristen N Ward, DO  oxyCODONE-acetaminophen (PERCOCET/ROXICET) 5-325 MG per tablet Take 1-2 tablets by mouth every 6 (six) hours as needed for pain. 10/27/12   Charles B. Karle Starch, MD  oxyCODONE-acetaminophen (PERCOCET/ROXICET) 5-325 MG per tablet Take 2 tablets by mouth every 4 (four) hours as needed for severe pain. 11/26/13   Elisha Headland, NP  phenazopyridine (PYRIDIUM) 200 MG tablet Take 1 tablet (200 mg total) by mouth 3 (three) times daily. 06/10/13   Fransico Meadow, PA-C   phenazopyridine (PYRIDIUM) 200 MG tablet Take 1 tablet (200 mg total) by mouth 3 (three) times daily. 12/13/13   April K Palumbo-Rasch, MD  potassium chloride SA (K-DUR,KLOR-CON) 20 MEQ tablet Take 1 tablet (20 mEq total) by mouth 2 (two) times daily. 10/25/12   Fredia Sorrow, MD  predniSONE (DELTASONE) 20 MG tablet 3 tabs po day one, then 2 po daily x 4 days 11/07/13   April K Palumbo-Rasch, MD  Prenatal Vit-Fe Fumarate-FA (MULTIVITAMIN-PRENATAL) 27-0.8 MG TABS Take 1 tablet by mouth daily.    Historical Provider, MD  Prenatal Vit-Fe Fumarate-FA (PRENATAL MULTIVITAMIN) TABS Take 1 tablet by mouth daily at 12 noon.    Historical Provider, MD  promethazine (PHENERGAN) 25 MG tablet Take 1 tablet (25 mg total) by mouth every 6 (six) hours as needed for nausea. 10/25/12   Fredia Sorrow, MD  UNABLE TO FIND Med Name: **    Historical Provider, MD  UNABLE TO FIND B/p med for migraine but not sure of the name    Historical Provider, MD   BP 134/84 mmHg  Pulse 66  Temp(Src) 98.1 F (36.7 C) (Oral)  Resp 18  Ht 5\' 2"  (1.575 m)  Wt 150 lb (68.04 kg)  BMI 27.43 kg/m2  SpO2 100%  LMP 11/13/2014 Physical Exam  Constitutional: She appears well-developed and well-nourished.  HENT:  Head: Normocephalic and atraumatic.  Right Ear: External ear normal.  Left Ear: External ear normal.  Nose: Nose normal.  Eyes: Conjunctivae and EOM are normal.  Neck: Neck supple. No tracheal deviation present.  Pulmonary/Chest: Effort normal. No stridor. No respiratory distress.  Musculoskeletal: She exhibits no edema or tenderness.       Lumbar back: She exhibits decreased range of motion, pain and spasm. She exhibits no swelling and no edema.  Neurological: She is alert. She is not disoriented. No cranial nerve deficit or sensory deficit. She exhibits normal muscle tone. Coordination normal.  Skin: Skin is warm and dry. No rash noted. She is not diaphoretic. No erythema.  Psychiatric: She has a normal mood and affect.  Her behavior is normal. Thought content normal.  Nursing note and vitals reviewed.   ED Course  Procedures (including critical care time) Medications  HYDROmorphone (DILAUDID) injection 1 mg (not administered)     MDM   Final diagnoses:  Lumbar strain, initial encounter    No sign of acute neurological or vascular emergency associated with pt's back pain.  Consistent with muscle strain.  Doubt  sciatica.  Safe for outpatient follow up.     Dorie Rank, MD 11/24/14 [phone]

## 2014-11-24 NOTE — Discharge Instructions (Signed)
Low Back Sprain with Rehab  A sprain is an injury in which a ligament is torn. The ligaments of the lower back are vulnerable to sprains. However, they are strong and require great force to be injured. These ligaments are important for stabilizing the spinal column. Sprains are classified into three categories. Grade 1 sprains cause pain, but the tendon is not lengthened. Grade 2 sprains include a lengthened ligament, due to the ligament being stretched or partially ruptured. With grade 2 sprains there is still function, although the function may be decreased. Grade 3 sprains involve a complete tear of the tendon or muscle, and function is usually impaired. SYMPTOMS   Severe pain in the lower back.  Sometimes, a feeling of a "pop," "snap," or tear, at the time of injury.  Tenderness and sometimes swelling at the injury site.  Uncommonly, bruising (contusion) within 48 hours of injury.  Muscle spasms in the back. CAUSES  Low back sprains occur when a force is placed on the ligaments that is greater than they can handle. Common causes of injury include:  Performing a stressful act while off-balance.  Repetitive stressful activities that involve movement of the lower back.  Direct hit (trauma) to the lower back. RISK INCREASES WITH:  Contact sports (football, wrestling).  Collisions (major skiing accidents).  Sports that require throwing or lifting (baseball, weightlifting).  Sports involving twisting of the spine (gymnastics, diving, tennis, golf).  Poor strength and flexibility.  Inadequate protection.  Previous back injury or surgery (especially fusion). PREVENTION  Wear properly fitted and padded protective equipment.  Warm up and stretch properly before activity.  Allow for adequate recovery between workouts.  Maintain physical fitness:  Strength, flexibility, and endurance.  Cardiovascular fitness.  Maintain a healthy body weight. PROGNOSIS  If treated  properly, low back sprains usually heal with non-surgical treatment. The length of time for healing depends on the severity of the injury.  RELATED COMPLICATIONS   Recurring symptoms, resulting in a chronic problem.  Chronic inflammation and pain in the low back.  Delayed healing or resolution of symptoms, especially if activity is resumed too soon.  Prolonged impairment.  Unstable or arthritic joints of the low back. TREATMENT  Treatment first involves the use of ice and medicine, to reduce pain and inflammation. The use of strengthening and stretching exercises may help reduce pain with activity. These exercises may be performed at home or with a therapist. Severe injuries may require referral to a therapist for further evaluation and treatment, such as ultrasound. Your caregiver may advise that you wear a back brace or corset, to help reduce pain and discomfort. Often, prolonged bed rest results in greater harm then benefit. Corticosteroid injections may be recommended. However, these should be reserved for the most serious cases. It is important to avoid using your back when lifting objects. At night, sleep on your back on a firm mattress, with a pillow placed under your knees. If non-surgical treatment is unsuccessful, surgery may be needed.  MEDICATION   If pain medicine is needed, nonsteroidal anti-inflammatory medicines (aspirin and ibuprofen), or other minor pain relievers (acetaminophen), are often advised.  Do not take pain medicine for 7 days before surgery.  Prescription pain relievers may be given, if your caregiver thinks they are needed. Use only as directed and only as much as you need.  Ointments applied to the skin may be helpful.  Corticosteroid injections may be given by your caregiver. These injections should be reserved for the most serious cases,   because they may only be given a certain number of times. HEAT AND COLD  Cold treatment (icing) should be applied for 10  to 15 minutes every 2 to 3 hours for inflammation and pain, and immediately after activity that aggravates your symptoms. Use ice packs or an ice massage.  Heat treatment may be used before performing stretching and strengthening activities prescribed by your caregiver, physical therapist, or athletic trainer. Use a heat pack or a warm water soak. SEEK MEDICAL CARE IF:   Symptoms get worse or do not improve in 2 to 4 weeks, despite treatment.  You develop numbness or weakness in either leg.  You lose bowel or bladder function.  Any of the following occur after surgery: fever, increased pain, swelling, redness, drainage of fluids, or bleeding in the affected area.  New, unexplained symptoms develop. (Drugs used in treatment may produce side effects.) EXERCISES  RANGE OF MOTION (ROM) AND STRETCHING EXERCISES - Low Back Sprain Most people with lower back pain will find that their symptoms get worse with excessive bending forward (flexion) or arching at the lower back (extension). The exercises that will help resolve your symptoms will focus on the opposite motion.  Your physician, physical therapist or athletic trainer will help you determine which exercises will be most helpful to resolve your lower back pain. Do not complete any exercises without first consulting with your caregiver. Discontinue any exercises which make your symptoms worse, until you speak to your caregiver. If you have pain, numbness or tingling which travels down into your buttocks, leg or foot, the goal of the therapy is for these symptoms to move closer to your back and eventually resolve. Sometimes, these leg symptoms will get better, but your lower back pain may worsen. This is often an indication of progress in your rehabilitation. Be very alert to any changes in your symptoms and the activities in which you participated in the 24 hours prior to the change. Sharing this information with your caregiver will allow him or her to  most efficiently treat your condition. These exercises may help you when beginning to rehabilitate your injury. Your symptoms may resolve with or without further involvement from your physician, physical therapist or athletic trainer. While completing these exercises, remember:   Restoring tissue flexibility helps normal motion to return to the joints. This allows healthier, less painful movement and activity.  An effective stretch should be held for at least 30 seconds.  A stretch should never be painful. You should only feel a gentle lengthening or release in the stretched tissue. FLEXION RANGE OF MOTION AND STRETCHING EXERCISES: STRETCH - Flexion, Single Knee to Chest   Lie on a firm bed or floor with both legs extended in front of you.  Keeping one leg in contact with the floor, bring your opposite knee to your chest. Hold your leg in place by either grabbing behind your thigh or at your knee.  Pull until you feel a gentle stretch in your low back. Hold __________ seconds.  Slowly release your grasp and repeat the exercise with the opposite side. Repeat __________ times. Complete this exercise __________ times per day.  STRETCH - Flexion, Double Knee to Chest  Lie on a firm bed or floor with both legs extended in front of you.  Keeping one leg in contact with the floor, bring your opposite knee to your chest.  Tense your stomach muscles to support your back and then lift your other knee to your chest. Hold your legs   in place by either grabbing behind your thighs or at your knees.  Pull both knees toward your chest until you feel a gentle stretch in your low back. Hold __________ seconds.  Tense your stomach muscles and slowly return one leg at a time to the floor. Repeat __________ times. Complete this exercise __________ times per day.  STRETCH - Low Trunk Rotation  Lie on a firm bed or floor. Keeping your legs in front of you, bend your knees so they are both pointed toward the  ceiling and your feet are flat on the floor.  Extend your arms out to the side. This will stabilize your upper body by keeping your shoulders in contact with the floor.  Gently and slowly drop both knees together to one side until you feel a gentle stretch in your low back. Hold for __________ seconds.  Tense your stomach muscles to support your lower back as you bring your knees back to the starting position. Repeat the exercise to the other side. Repeat __________ times. Complete this exercise __________ times per day  EXTENSION RANGE OF MOTION AND FLEXIBILITY EXERCISES: STRETCH - Extension, Prone on Elbows   Lie on your stomach on the floor, a bed will be too soft. Place your palms about shoulder width apart and at the height of your head.  Place your elbows under your shoulders. If this is too painful, stack pillows under your chest.  Allow your body to relax so that your hips drop lower and make contact more completely with the floor.  Hold this position for __________ seconds.  Slowly return to lying flat on the floor. Repeat __________ times. Complete this exercise __________ times per day.  RANGE OF MOTION - Extension, Prone Press Ups  Lie on your stomach on the floor, a bed will be too soft. Place your palms about shoulder width apart and at the height of your head.  Keeping your back as relaxed as possible, slowly straighten your elbows while keeping your hips on the floor. You may adjust the placement of your hands to maximize your comfort. As you gain motion, your hands will come more underneath your shoulders.  Hold this position __________ seconds.  Slowly return to lying flat on the floor. Repeat __________ times. Complete this exercise __________ times per day.  RANGE OF MOTION- Quadruped, Neutral Spine   Assume a hands and knees position on a firm surface. Keep your hands under your shoulders and your knees under your hips. You may place padding under your knees for  comfort.  Drop your head and point your tailbone toward the ground below you. This will round out your lower back like an angry cat. Hold this position for __________ seconds.  Slowly lift your head and release your tail bone so that your back sags into a large arch, like an old horse.  Hold this position for __________ seconds.  Repeat this until you feel limber in your low back.  Now, find your "sweet spot." This will be the most comfortable position somewhere between the two previous positions. This is your neutral spine. Once you have found this position, tense your stomach muscles to support your low back.  Hold this position for __________ seconds. Repeat __________ times. Complete this exercise __________ times per day.  STRENGTHENING EXERCISES - Low Back Sprain These exercises may help you when beginning to rehabilitate your injury. These exercises should be done near your "sweet spot." This is the neutral, low-back arch, somewhere between fully rounded   and fully arched, that is your least painful position. When performed in this safe range of motion, these exercises can be used for people who have either a flexion or extension based injury. These exercises may resolve your symptoms with or without further involvement from your physician, physical therapist or athletic trainer. While completing these exercises, remember:   Muscles can gain both the endurance and the strength needed for everyday activities through controlled exercises.  Complete these exercises as instructed by your physician, physical therapist or athletic trainer. Increase the resistance and repetitions only as guided.  You may experience muscle soreness or fatigue, but the pain or discomfort you are trying to eliminate should never worsen during these exercises. If this pain does worsen, stop and make certain you are following the directions exactly. If the pain is still present after adjustments, discontinue the  exercise until you can discuss the trouble with your caregiver. STRENGTHENING - Deep Abdominals, Pelvic Tilt   Lie on a firm bed or floor. Keeping your legs in front of you, bend your knees so they are both pointed toward the ceiling and your feet are flat on the floor.  Tense your lower abdominal muscles to press your low back into the floor. This motion will rotate your pelvis so that your tail bone is scooping upwards rather than pointing at your feet or into the floor. With a gentle tension and even breathing, hold this position for __________ seconds. Repeat __________ times. Complete this exercise __________ times per day.  STRENGTHENING - Abdominals, Crunches   Lie on a firm bed or floor. Keeping your legs in front of you, bend your knees so they are both pointed toward the ceiling and your feet are flat on the floor. Cross your arms over your chest.  Slightly tip your chin down without bending your neck.  Tense your abdominals and slowly lift your trunk high enough to just clear your shoulder blades. Lifting higher can put excessive stress on the lower back and does not further strengthen your abdominal muscles.  Control your return to the starting position. Repeat __________ times. Complete this exercise __________ times per day.  STRENGTHENING - Quadruped, Opposite UE/LE Lift   Assume a hands and knees position on a firm surface. Keep your hands under your shoulders and your knees under your hips. You may place padding under your knees for comfort.  Find your neutral spine and gently tense your abdominal muscles so that you can maintain this position. Your shoulders and hips should form a rectangle that is parallel with the floor and is not twisted.  Keeping your trunk steady, lift your right hand no higher than your shoulder and then your left leg no higher than your hip. Make sure you are not holding your breath. Hold this position for __________ seconds.  Continuing to keep  your abdominal muscles tense and your back steady, slowly return to your starting position. Repeat with the opposite arm and leg. Repeat __________ times. Complete this exercise __________ times per day.  STRENGTHENING - Abdominals and Quadriceps, Straight Leg Raise   Lie on a firm bed or floor with both legs extended in front of you.  Keeping one leg in contact with the floor, bend the other knee so that your foot can rest flat on the floor.  Find your neutral spine, and tense your abdominal muscles to maintain your spinal position throughout the exercise.  Slowly lift your straight leg off the floor about 6 inches for a count   of 15, making sure to not hold your breath.  Still keeping your neutral spine, slowly lower your leg all the way to the floor. Repeat this exercise with each leg __________ times. Complete this exercise __________ times per day. POSTURE AND BODY MECHANICS CONSIDERATIONS - Low Back Sprain Keeping correct posture when sitting, standing or completing your activities will reduce the stress put on different body tissues, allowing injured tissues a chance to heal and limiting painful experiences. The following are general guidelines for improved posture. Your physician or physical therapist will provide you with any instructions specific to your needs. While reading these guidelines, remember:  The exercises prescribed by your provider will help you have the flexibility and strength to maintain correct postures.  The correct posture provides the best environment for your joints to work. All of your joints have less wear and tear when properly supported by a spine with good posture. This means you will experience a healthier, less painful body.  Correct posture must be practiced with all of your activities, especially prolonged sitting and standing. Correct posture is as important when doing repetitive low-stress activities (typing) as it is when doing a single heavy-load  activity (lifting). RESTING POSITIONS Consider which positions are most painful for you when choosing a resting position. If you have pain with flexion-based activities (sitting, bending, stooping, squatting), choose a position that allows you to rest in a less flexed posture. You would want to avoid curling into a fetal position on your side. If your pain worsens with extension-based activities (prolonged standing, working overhead), avoid resting in an extended position such as sleeping on your stomach. Most people will find more comfort when they rest with their spine in a more neutral position, neither too rounded nor too arched. Lying on a non-sagging bed on your side with a pillow between your knees, or on your back with a pillow under your knees will often provide some relief. Keep in mind, being in any one position for a prolonged period of time, no matter how correct your posture, can still lead to stiffness. PROPER SITTING POSTURE In order to minimize stress and discomfort on your spine, you must sit with correct posture. Sitting with good posture should be effortless for a healthy body. Returning to good posture is a gradual process. Many people can work toward this most comfortably by using various supports until they have the flexibility and strength to maintain this posture on their own. When sitting with proper posture, your ears will fall over your shoulders and your shoulders will fall over your hips. You should use the back of the chair to support your upper back. Your lower back will be in a neutral position, just slightly arched. You may place a small pillow or folded towel at the base of your lower back for  support.  When working at a desk, create an environment that supports good, upright posture. Without extra support, muscles tire, which leads to excessive strain on joints and other tissues. Keep these recommendations in mind: CHAIR:  A chair should be able to slide under your desk  when your back makes contact with the back of the chair. This allows you to work closely.  The chair's height should allow your eyes to be level with the upper part of your monitor and your hands to be slightly lower than your elbows. BODY POSITION  Your feet should make contact with the floor. If this is not possible, use a foot rest.  Keep your   ears over your shoulders. This will reduce stress on your neck and low back. INCORRECT SITTING POSTURES  If you are feeling tired and unable to assume a healthy sitting posture, do not slouch or slump. This puts excessive strain on your back tissues, causing more damage and pain. Healthier options include:  Using more support, like a lumbar pillow.  Switching tasks to something that requires you to be upright or walking.  Talking a brief walk.  Lying down to rest in a neutral-spine position. PROLONGED STANDING WHILE SLIGHTLY LEANING FORWARD  When completing a task that requires you to lean forward while standing in one place for a long time, place either foot up on a stationary 2-4 inch high object to help maintain the best posture. When both feet are on the ground, the lower back tends to lose its slight inward curve. If this curve flattens (or becomes too large), then the back and your other joints will experience too much stress, tire more quickly, and can cause pain. CORRECT STANDING POSTURES Proper standing posture should be assumed with all daily activities, even if they only take a few moments, like when brushing your teeth. As in sitting, your ears should fall over your shoulders and your shoulders should fall over your hips. You should keep a slight tension in your abdominal muscles to brace your spine. Your tailbone should point down to the ground, not behind your body, resulting in an over-extended swayback posture.  INCORRECT STANDING POSTURES  Common incorrect standing postures include a forward head, locked knees and/or an excessive  swayback. WALKING Walk with an upright posture. Your ears, shoulders and hips should all line-up. PROLONGED ACTIVITY IN A FLEXED POSITION When completing a task that requires you to bend forward at your waist or lean over a low surface, try to find a way to stabilize 3 out of 4 of your limbs. You can place a hand or elbow on your thigh or rest a knee on the surface you are reaching across. This will provide you more stability, so that your muscles do not tire as quickly. By keeping your knees relaxed, or slightly bent, you will also reduce stress across your lower back. CORRECT LIFTING TECHNIQUES DO :  Assume a wide stance. This will provide you more stability and the opportunity to get as close as possible to the object which you are lifting.  Tense your abdominals to brace your spine. Bend at the knees and hips. Keeping your back locked in a neutral-spine position, lift using your leg muscles. Lift with your legs, keeping your back straight.  Test the weight of unknown objects before attempting to lift them.  Try to keep your elbows locked down at your sides in order get the best strength from your shoulders when carrying an object.  Always ask for help when lifting heavy or awkward objects. INCORRECT LIFTING TECHNIQUES DO NOT:   Lock your knees when lifting, even if it is a small object.  Bend and twist. Pivot at your feet or move your feet when needing to change directions.  Assume that you can safely pick up even a paperclip without proper posture. Document Released: 10/07/2005 Document Revised: 12/30/2011 Document Reviewed: 01/19/2009 [employer] Patient Information 2015 [employer]. This information is not intended to replace advice given to you by your health care provider. Make sure you discuss any questions you have with your health care provider.  

## 2014-12-01 ENCOUNTER — Emergency Department (HOSPITAL_BASED_OUTPATIENT_CLINIC_OR_DEPARTMENT_OTHER)
Admission: EM | Admit: 2014-12-01 | Discharge: 2014-12-01 | Disposition: A | Discharge status: Home / Self Care | Payer: 59 | Attending: Emergency Medicine | Admitting: Emergency Medicine

## 2014-12-01 ENCOUNTER — Encounter (HOSPITAL_BASED_OUTPATIENT_CLINIC_OR_DEPARTMENT_OTHER): Payer: Self-pay | Admitting: *Deleted

## 2014-12-01 DIAGNOSIS — I10 Essential (primary) hypertension: Secondary | ICD-10-CM | POA: Insufficient documentation

## 2014-12-01 DIAGNOSIS — B349 Viral infection, unspecified: Secondary | ICD-10-CM

## 2014-12-01 DIAGNOSIS — Z7952 Long term (current) use of systemic steroids: Secondary | ICD-10-CM | POA: Insufficient documentation

## 2014-12-01 DIAGNOSIS — R509 Fever, unspecified: Secondary | ICD-10-CM | POA: Diagnosis present

## 2014-12-01 DIAGNOSIS — Z87440 Personal history of urinary (tract) infections: Secondary | ICD-10-CM | POA: Insufficient documentation

## 2014-12-01 DIAGNOSIS — Z79899 Other long term (current) drug therapy: Secondary | ICD-10-CM | POA: Diagnosis not present

## 2014-12-01 DIAGNOSIS — R0981 Nasal congestion: Secondary | ICD-10-CM

## 2014-12-01 MED ORDER — FLUTICASONE PROPIONATE 50 MCG/ACT NA SUSP: 2.0000 | Freq: Every day | NASAL | Status: DC

## 2014-12-01 MED ORDER — SALINE SPRAY 0.65 % NA SOLN: 1.0000 | NASAL | Status: DC | PRN

## 2014-12-01 MED ORDER — IBUPROFEN 800 MG PO TABS: 800.0000 mg | ORAL_TABLET | Freq: Three times a day (TID) | ORAL | Status: DC

## 2014-12-01 NOTE — ED Provider Notes (Signed)
CSN: [id number]     Arrival date & time 12/01/14  1249 History   First MD Initiated Contact with Patient 12/01/14 1251     Chief Complaint  Patient presents with  . Fever     (Consider location/radiation/quality/duration/timing/severity/associated sxs/prior Treatment) HPI Comments: 44 year old female presenting with generalized body aches, fever, chills and headache 2 days. Patient reports she woke up yesterday not feeling well. Tmax was earlier today of 100.3. She has been taking Tylenol with no relief. Headache is located around her for head. Denies cough, congestion, nausea or vomiting. No sick contacts. States she works from home. She did not have a flu shot this year.  Patient is a 44 y.o. female presenting with fever. The history is provided by the patient.  Fever Associated symptoms: chills, headaches and myalgias     Past Medical History  Diagnosis Date  . Migraine   . Migraine   . UTI (lower urinary tract infection)   . Hypertension    Past Surgical History  Procedure Laterality Date  . Cholecystectomy    . Rotator cuff repair     No family history on file. History  Substance Use Topics  . Smoking status: Never Smoker   . Smokeless tobacco: Never Used  . Alcohol Use: No   OB History    No data available     Review of Systems  Constitutional: Positive for fever and chills.  Musculoskeletal: Positive for myalgias and arthralgias.  Neurological: Positive for headaches.  All other systems reviewed and are negative.     Allergies  Midazolam hcl  Home Medications    aspirin-acetaminophen-caffeine (EXCEDRIN MIGRAINE) 250-250-65 MG per tablet Take 2 tablets by mouth every 6 (six) hours as needed. For migraines  Historical Provider, MD   cyclobenzaprine (FLEXERIL) 10 MG tablet Take 0.5 tablets (5 mg total) by mouth 3 (three) times daily as needed for muscle spasms. 21 tablet Dorie Rank, MD   fluticasone [facility]) 50 MCG/ACT nasal spray Place 2 sprays into  both nostrils daily. 16 g Orchid Glassberg M Trixie Maclaren, PA-C   HYDROcodone-acetaminophen (NORCO/VICODIN) 5-325 MG per tablet Take 1-2 tablets by mouth every 4 (four) hours as needed. 20 tablet Dorie Rank, MD   ibuprofen (ADVIL,MOTRIN) 800 MG tablet Take 1 tablet (800 mg total) by mouth every 8 (eight) hours as needed. 30 tablet Dorie Rank, MD   ibuprofen (ADVIL,MOTRIN) 800 MG tablet Take 1 tablet (800 mg total) by mouth 3 (three) times daily. 15 tablet Tiphany Fayson M Majel Giel, PA-C   propranolol (INDERAL) 80 MG tablet Take 80 mg by mouth daily.  Historical Provider, MD   sodium chloride (OCEAN) 0.65 % SOLN nasal spray Place 1 spray into both nostrils as needed for congestion.       BP 142/95 mmHg  Pulse 90  Temp(Src) 99.1 F (37.3 C) (Oral)  Resp 20  Ht 5\' 2"  (1.575 m)  Wt 150 lb (68.04 kg)  BMI 27.43 kg/m2  SpO2 100%  LMP 11/13/2014 Physical Exam  Constitutional: She is oriented to person, place, and time. She appears well-developed and well-nourished. No distress.  HENT:  Head: Normocephalic and atraumatic.  Nasal mucosal edema. Bilateral frontal sinus tenderness.  Eyes: Conjunctivae and EOM are normal. Pupils are equal, round, and reactive to light.  Neck: Normal range of motion. Neck supple.  Cardiovascular: Normal rate, regular rhythm and normal heart sounds.   Pulmonary/Chest: Effort normal and breath sounds normal. No respiratory distress.  Musculoskeletal: Normal range of motion. She exhibits no edema.  Lymphadenopathy:  She has no cervical adenopathy.  Neurological: She is alert and oriented to person, place, and time. No sensory deficit.  Skin: Skin is warm and dry.  Psychiatric: She has a normal mood and affect. Her behavior is normal.  Nursing note and vitals reviewed.   ED Course  Procedures (including critical care time) Labs Review Labs Reviewed - No data to display  Imaging Review No results found.   EKG Interpretation None      MDM   Final diagnoses:  Viral illness   Sinus congestion   Pt in NAD. AFVSS. Discussed symptomatic treatment including nasal spray, decongestant, nasal saline, rest, hydration. F/u with PCP. Stable for d/c. Return precautions given. Patient states understanding of treatment care plan and is agreeable.  Carman Ching, PA-C 12/01/14 1317  Pamella Pert, MD 12/01/14 1535

## 2014-12-01 NOTE — ED Notes (Signed)
Fever since yesterday. Aching all over. Headache. No flu shot this year.

## 2014-12-01 NOTE — Discharge Instructions (Signed)
Rest, stay well hydrated. Use nasal saline and flonase as directed. You may also take over-the-counter decongestants. Follow up with your primary care doctor.  Viral Infections A viral infection can be caused by different types of viruses.Most viral infections are not serious and resolve on their own. However, some infections may cause severe symptoms and may lead to further complications. SYMPTOMS Viruses can frequently cause:  Minor sore throat.  Aches and pains.  Headaches.  Runny nose.  Different types of rashes.  Watery eyes.  Tiredness.  Cough.  Loss of appetite.  Gastrointestinal infections, resulting in nausea, vomiting, and diarrhea. These symptoms do not respond to antibiotics because the infection is not caused by bacteria. However, you might catch a bacterial infection following the viral infection. This is sometimes called a "superinfection." Symptoms of such a bacterial infection may include:  Worsening sore throat with pus and difficulty swallowing.  Swollen neck glands.  Chills and a high or persistent fever.  Severe headache.  Tenderness over the sinuses.  Persistent overall ill feeling (malaise), muscle aches, and tiredness (fatigue).  Persistent cough.  Yellow, green, or brown mucus production with coughing. HOME CARE INSTRUCTIONS   Only take over-the-counter or prescription medicines for pain, discomfort, diarrhea, or fever as directed by your caregiver.  Drink enough water and fluids to keep your urine clear or pale yellow. Sports drinks can provide valuable electrolytes, sugars, and hydration.  Get plenty of rest and maintain proper nutrition. Soups and broths with crackers or rice are fine. SEEK IMMEDIATE MEDICAL CARE IF:   You have severe headaches, shortness of breath, chest pain, neck pain, or an unusual rash.  You have uncontrolled vomiting, diarrhea, or you are unable to keep down fluids.  You or your child has an oral temperature  above 102 F (38.9 C), not controlled by medicine.  Your baby is older than 3 months with a rectal temperature of 102 F (38.9 C) or higher.  Your baby is 21 months old or younger with a rectal temperature of 100.4 F (38 C) or higher. MAKE SURE YOU:   Understand these instructions.  Will watch your condition.  Will get help right away if you are not doing well or get worse. Document Released: 07/17/2005 Document Revised: 12/30/2011 Document Reviewed: 02/11/2011 [facility] Patient Information 2015 [place], [place]. This information is not intended to replace advice given to you by your health care provider. Make sure you discuss any questions you have with your health care provider.

## 2014-12-10 ENCOUNTER — Encounter (HOSPITAL_BASED_OUTPATIENT_CLINIC_OR_DEPARTMENT_OTHER): Payer: Self-pay | Admitting: Emergency Medicine

## 2014-12-10 ENCOUNTER — Emergency Department (HOSPITAL_BASED_OUTPATIENT_CLINIC_OR_DEPARTMENT_OTHER)
Admission: EM | Admit: 2014-12-10 | Discharge: 2014-12-10 | Disposition: A | Discharge status: Home / Self Care | Payer: 59 | Attending: Emergency Medicine | Admitting: Emergency Medicine

## 2014-12-10 DIAGNOSIS — R05 Cough: Secondary | ICD-10-CM | POA: Diagnosis present

## 2014-12-10 DIAGNOSIS — I10 Essential (primary) hypertension: Secondary | ICD-10-CM | POA: Insufficient documentation

## 2014-12-10 DIAGNOSIS — R0981 Nasal congestion: Secondary | ICD-10-CM | POA: Insufficient documentation

## 2014-12-10 DIAGNOSIS — Z87440 Personal history of urinary (tract) infections: Secondary | ICD-10-CM | POA: Insufficient documentation

## 2014-12-10 DIAGNOSIS — R000 Tachycardia, unspecified: Secondary | ICD-10-CM | POA: Diagnosis not present

## 2014-12-10 DIAGNOSIS — G43909 Migraine, unspecified, not intractable, without status migrainosus: Secondary | ICD-10-CM | POA: Diagnosis not present

## 2014-12-10 DIAGNOSIS — Z7951 Long term (current) use of inhaled steroids: Secondary | ICD-10-CM | POA: Insufficient documentation

## 2014-12-10 DIAGNOSIS — R059 Cough, unspecified: Secondary | ICD-10-CM

## 2014-12-10 DIAGNOSIS — Z79899 Other long term (current) drug therapy: Secondary | ICD-10-CM | POA: Diagnosis not present

## 2014-12-10 MED ORDER — HYDROCODONE-HOMATROPINE 5-1.5 MG/5ML PO SYRP: 5.0000 mL | ORAL_SOLUTION | Freq: Four times a day (QID) | ORAL | Status: DC | PRN

## 2014-12-10 MED ORDER — PSEUDOEPHEDRINE HCL ER 120 MG PO TB12: 120.0000 mg | ORAL_TABLET | Freq: Two times a day (BID) | ORAL | Status: AC

## 2014-12-10 MED ORDER — PSEUDOEPHEDRINE HCL ER 120 MG PO TB12
120.0000 mg | ORAL_TABLET | Freq: Two times a day (BID) | ORAL | Status: DC
Start: 2014-12-10 — End: 2014-12-10
  Filled 2014-12-10: qty 1

## 2014-12-10 MED ORDER — HYDROCODONE-HOMATROPINE 5-1.5 MG/5ML PO SYRP
5.0000 mL | ORAL_SOLUTION | Freq: Four times a day (QID) | ORAL | Status: DC | PRN
  Filled 2014-12-10: qty 5

## 2014-12-10 MED ORDER — OMEPRAZOLE 20 MG PO CPDR: 20.0000 mg | DELAYED_RELEASE_CAPSULE | Freq: Every day | ORAL | Status: DC

## 2014-12-10 NOTE — ED Provider Notes (Signed)
CSN: [id number]     Arrival date & time 12/10/14  7829 History   First MD Initiated Contact with Patient 12/10/14 [phone]     Chief Complaint  Patient presents with  . Cough     (Consider location/radiation/quality/duration/timing/severity/associated sxs/prior Treatment) HPI Patient presents one week after being evaluated for sinus irritation, now with ongoing cough. She states that since one week ago her sinus pressure has improved substantially, has resolved, but she has persistent cough for the past 2 days. Cough is worse with supine positioning. No chest pain, dyspnea.  There is a burning sensation in her throat and lower neck. There is no exertional or pleuritic pain. There is no syncope, lightheadedness. Symptoms are minimally improved with Robitussin, oral cough medication.  Past Medical History  Diagnosis Date  . Migraine   . Migraine   . UTI (lower urinary tract infection)   . Hypertension    Past Surgical History  Procedure Laterality Date  . Cholecystectomy    . Rotator cuff repair     History reviewed. No pertinent family history. History  Substance Use Topics  . Smoking status: Never Smoker   . Smokeless tobacco: Never Used  . Alcohol Use: No   OB History    No data available     Review of Systems  Constitutional: Negative for fever and chills.  HENT: Positive for congestion.   Eyes: Negative for discharge.  Respiratory: Positive for cough.   Cardiovascular: Negative for chest pain.  Gastrointestinal: Negative for nausea and vomiting.      Allergies  Midazolam hcl  Home Medications   Prior to Admission medications   Medication Sig Start Date End Date Taking? Authorizing Provider  aspirin-acetaminophen-caffeine (EXCEDRIN MIGRAINE) [phone] MG per tablet Take 2 tablets by mouth every 6 (six) hours as needed. For migraines    Historical Provider, MD  cyclobenzaprine (FLEXERIL) 10 MG tablet Take 0.5 tablets (5 mg total) by mouth 3 (three) times  daily as needed for muscle spasms. 11/24/14   Dorie Rank, MD  fluticasone (FLONASE) 50 MCG/ACT nasal spray Place 2 sprays into both nostrils daily. 12/01/14   Robyn M Hess, PA-C  HYDROcodone-homatropine (HYCODAN) 5-1.5 MG/5ML syrup Take 5 mLs by mouth every 6 (six) hours as needed for cough. 12/10/14   Carmin Muskrat, MD  omeprazole (PRILOSEC) 20 MG capsule Take 1 capsule (20 mg total) by mouth daily. 12/10/14 12/31/14  Carmin Muskrat, MD  propranolol (INDERAL) 80 MG tablet Take 80 mg by mouth daily.    Historical Provider, MD  pseudoephedrine (SUDAFED) 120 MG 12 hr tablet Take 1 tablet (120 mg total) by mouth 2 (two) times daily. 12/10/14 12/15/14  Carmin Muskrat, MD  sodium chloride (OCEAN) 0.65 % SOLN nasal spray Place 1 spray into both nostrils as needed for congestion. 12/01/14   [place], PA-C  UNABLE TO FIND Med Name:     Historical Provider, MD  UNABLE TO FIND B/p med for migraine but not sure of the name    Historical Provider, MD   BP 141/95 mmHg  Pulse 90  Temp(Src) 98.1 F (36.7 C) (Oral)  Resp 16  Ht 5\' 2"  (1.575 m)  Wt 135 lb (61.236 kg)  BMI 24.69 kg/m2  SpO2 99%  LMP 11/13/2014 Physical Exam  Constitutional: She is oriented to person, place, and time. She appears well-developed and well-nourished. No distress.  HENT:  Head: Normocephalic and atraumatic.  Mouth/Throat:    Eyes: Conjunctivae and EOM are normal.  Neck: No tracheal deviation present.  No thyromegaly present.  Cardiovascular: Normal rate and regular rhythm.   Pulmonary/Chest: Effort normal and breath sounds normal. No stridor. No respiratory distress. She has no wheezes.  Clear lung sounds throughout  Musculoskeletal: She exhibits no edema.  Lymphadenopathy:  No appreciable cervical lymphadenopathy  Neurological: She is alert and oriented to person, place, and time. No cranial nerve deficit.  Skin: Skin is warm and dry.  Psychiatric: She has a normal mood and affect.  Nursing note and vitals  reviewed.   ED Course  Procedures (including critical care time) I reviewed the patient's chart, including recent ED evaluation, diagnosis of viral infection 1 week ago.   MDM   Final diagnoses:  Cough    Well-appearing female presents with ongoing cough.  On exam patient is awake, alert, hypoxic, tachycardic, tachypneic, febrile, in no distress.  Symptoms consistent with resolving sinus irritation or URI.  Low clinical suspicion for pneumonia. Patient was started on a course of decongestants, as well as PPI, for prophylaxis against possible GERD, though this seems less likely. Patient was provided local follow-up.    Carmin Muskrat, MD 12/10/14 [phone]

## 2014-12-10 NOTE — Discharge Instructions (Signed)
As discussed, your evaluation today has been largely reassuring.  But, it is important that you monitor your condition carefully, and do not hesitate to return to the ED if you develop new, or concerning changes in your condition.  In addition to the newly prescribed medication, please be sure to use your nasal saline inhaler as previously directed.  Otherwise, please follow-up with our physician for appropriate ongoing care.   Cough, Adult  A cough is a reflex. It helps you clear your throat and airways. A cough can help heal your body. A cough can last 2 or 3 weeks (acute) or may last more than 8 weeks (chronic). Some common causes of a cough can include an infection, allergy, or a cold. HOME CARE  Only take medicine as told by your doctor.  If given, take your medicines (antibiotics) as told. Finish them even if you start to feel better.  Use a cold steam vaporizer or humidifier in your home. This can help loosen thick spit (secretions).  Sleep so you are almost sitting up (semi-upright). Use pillows to do this. This helps reduce coughing.  Rest as needed.  Stop smoking if you smoke. GET HELP RIGHT AWAY IF:  You have yellowish-white fluid (pus) in your thick spit.  Your cough gets worse.  Your medicine does not reduce coughing, and you are losing sleep.  You cough up blood.  You have trouble breathing.  Your pain gets worse and medicine does not help.  You have a fever. MAKE SURE YOU:   Understand these instructions.  Will watch your condition.  Will get help right away if you are not doing well or get worse. Document Released: 06/20/2011 Document Revised: 02/21/2014 Document Reviewed: 06/20/2011 [facility] Patient Information 2015 [place], [place]. This information is not intended to replace advice given to you by your health care provider. Make sure you discuss any questions you have with your health care provider.

## 2014-12-10 NOTE — ED Notes (Signed)
Patient was here recently with a sinus infect. Now has 2 days of a cough that is not getting better with OTC medication.

## 2015-01-19 ENCOUNTER — Emergency Department (HOSPITAL_BASED_OUTPATIENT_CLINIC_OR_DEPARTMENT_OTHER)
Admission: EM | Admit: 2015-01-19 | Discharge: 2015-01-19 | Disposition: A | Discharge status: Home / Self Care | Payer: 59 | Attending: Emergency Medicine | Admitting: Emergency Medicine

## 2015-01-19 ENCOUNTER — Encounter (HOSPITAL_BASED_OUTPATIENT_CLINIC_OR_DEPARTMENT_OTHER): Payer: Self-pay

## 2015-01-19 DIAGNOSIS — R110 Nausea: Secondary | ICD-10-CM | POA: Insufficient documentation

## 2015-01-19 DIAGNOSIS — R51 Headache: Secondary | ICD-10-CM | POA: Diagnosis present

## 2015-01-19 DIAGNOSIS — G43909 Migraine, unspecified, not intractable, without status migrainosus: Secondary | ICD-10-CM

## 2015-01-19 DIAGNOSIS — Z87440 Personal history of urinary (tract) infections: Secondary | ICD-10-CM | POA: Insufficient documentation

## 2015-01-19 DIAGNOSIS — I10 Essential (primary) hypertension: Secondary | ICD-10-CM | POA: Insufficient documentation

## 2015-01-19 DIAGNOSIS — G40909 Epilepsy, unspecified, not intractable, without status epilepticus: Secondary | ICD-10-CM | POA: Insufficient documentation

## 2015-01-19 MED ORDER — METOCLOPRAMIDE HCL 5 MG/ML IJ SOLN
10.0000 mg | Freq: Once | INTRAMUSCULAR | Status: AC
  Administered 2015-01-19: 10 mg via INTRAVENOUS
  Filled 2015-01-19: qty 2

## 2015-01-19 MED ORDER — SODIUM CHLORIDE 0.9 % IV BOLUS (SEPSIS)
1000.0000 mL | Freq: Once | INTRAVENOUS | Status: AC
  Administered 2015-01-19: 1000 mL via INTRAVENOUS

## 2015-01-19 MED ORDER — DIPHENHYDRAMINE HCL 50 MG/ML IJ SOLN
25.0000 mg | Freq: Once | INTRAMUSCULAR | Status: AC
  Administered 2015-01-19: 25 mg via INTRAVENOUS
  Filled 2015-01-19: qty 1

## 2015-01-19 MED ORDER — KETOROLAC TROMETHAMINE 30 MG/ML IJ SOLN
30.0000 mg | Freq: Once | INTRAMUSCULAR | Status: AC
  Administered 2015-01-19: 30 mg via INTRAVENOUS
  Filled 2015-01-19: qty 1

## 2015-01-19 NOTE — Discharge Instructions (Signed)
Migraine Headache A migraine headache is an intense, throbbing pain on one or both sides of your head. A migraine can last for 30 minutes to several hours. CAUSES  The exact cause of a migraine headache is not always known. However, a migraine may be caused when nerves in the brain become irritated and release chemicals that cause inflammation. This causes pain. Certain things may also trigger migraines, such as:  Alcohol.  Smoking.  Stress.  Menstruation.  Aged cheeses.  Foods or drinks that contain nitrates, glutamate, aspartame, or tyramine.  Lack of sleep.  Chocolate.  Caffeine.  Hunger.  Physical exertion.  Fatigue.  Medicines used to treat chest pain (nitroglycerine), birth control pills, estrogen, and some blood pressure medicines. SIGNS AND SYMPTOMS  Pain on one or both sides of your head.  Pulsating or throbbing pain.  Severe pain that prevents daily activities.  Pain that is aggravated by any physical activity.  Nausea, vomiting, or both.  Dizziness.  Pain with exposure to bright lights, loud noises, or activity.  General sensitivity to bright lights, loud noises, or smells. Before you get a migraine, you may get warning signs that a migraine is coming (aura). An aura may include:  Seeing flashing lights.  Seeing bright spots, halos, or zigzag lines.  Having tunnel vision or blurred vision.  Having feelings of numbness or tingling.  Having trouble talking.  Having muscle weakness. DIAGNOSIS  A migraine headache is often diagnosed based on:  Symptoms.  Physical exam.  A CT scan or MRI of your head. These imaging tests cannot diagnose migraines, but they can help rule out other causes of headaches. TREATMENT Medicines may be given for pain and nausea. Medicines can also be given to help prevent recurrent migraines.  HOME CARE INSTRUCTIONS  Only take over-the-counter or prescription medicines for pain or discomfort as directed by your  health care provider. The use of long-term narcotics is not recommended.  Lie down in a dark, quiet room when you have a migraine.  Keep a journal to find out what may trigger your migraine headaches. For example, write down:  What you eat and drink.  How much sleep you get.  Any change to your diet or medicines.  Limit alcohol consumption.  Quit smoking if you smoke.  Get 7-9 hours of sleep, or as recommended by your health care provider.  Limit stress.  Keep lights dim if bright lights bother you and make your migraines worse. SEEK IMMEDIATE MEDICAL CARE IF:   Your migraine becomes severe.  You have a fever.  You have a stiff neck.  You have vision loss.  You have muscular weakness or loss of muscle control.  You start losing your balance or have trouble walking.  You feel faint or pass out.  You have severe symptoms that are different from your first symptoms. MAKE SURE YOU:   Understand these instructions.  Will watch your condition.  Will get help right away if you are not doing well or get worse. Document Released: 10/07/2005 Document Revised: 02/21/2014 Document Reviewed: 06/14/2013 [facility] Patient Information 2015 [place], [place]. This information is not intended to replace advice given to you by your health care provider. Make sure you discuss any questions you have with your health care provider.  Recurrent Migraine Headache A migraine headache is an intense, throbbing pain on one or both sides of your head. Recurrent migraines keep coming back. A migraine can last for 30 minutes to several hours. CAUSES  The exact cause  of a migraine headache is not always known. However, a migraine may be caused when nerves in the brain become irritated and release chemicals that cause inflammation. This causes pain. Certain things may also trigger migraines, such as:   Alcohol.  Smoking.  Stress.  Menstruation.  Aged cheeses.  Foods or drinks that contain  nitrates, glutamate, aspartame, or tyramine.  Lack of sleep.  Chocolate.  Caffeine.  Hunger.  Physical exertion.  Fatigue.  Medicines used to treat chest pain (nitroglycerine), birth control pills, estrogen, and some blood pressure medicines. SYMPTOMS   Pain on one or both sides of your head.  Pulsating or throbbing pain.  Severe pain that prevents daily activities.  Pain that is aggravated by any physical activity.  Nausea, vomiting, or both.  Dizziness.  Pain with exposure to bright lights, loud noises, or activity.  General sensitivity to bright lights, loud noises, or smells. Before you get a migraine, you may get warning signs that a migraine is coming (aura). An aura may include:  Seeing flashing lights.  Seeing bright spots, halos, or zigzag lines.  Having tunnel vision or blurred vision.  Having feelings of numbness or tingling.  Having trouble talking.  Having muscle weakness. DIAGNOSIS  A recurrent migraine headache is often diagnosed based on:  Symptoms.  Physical examination.  A CT scan or MRI of your head. These imaging tests cannot diagnose migraines but can help rule out other causes of headaches.  TREATMENT  Medicines may be given for pain and nausea. Medicines can also be given to help prevent recurrent migraines. HOME CARE INSTRUCTIONS  Only take over-the-counter or prescription medicines for pain or discomfort as directed by your health care provider. The use of long-term narcotics is not recommended.  Lie down in a dark, quiet room when you have a migraine.  Keep a journal to find out what may trigger your migraine headaches. For example, write down:  What you eat and drink.  How much sleep you get.  Any change to your diet or medicines.  Limit alcohol consumption.  Quit smoking if you smoke.  Get 7-9 hours of sleep, or as recommended by your health care provider.  Limit stress.  Keep lights dim if bright lights bother  you and make your migraines worse. SEEK MEDICAL CARE IF:   You do not get relief from the medicines given to you.  You have a recurrence of pain.  You have a fever. SEEK IMMEDIATE MEDICAL CARE IF:  Your migraine becomes severe.  You have a stiff neck.  You have loss of vision.  You have muscular weakness or loss of muscle control.  You start losing your balance or have trouble walking.  You feel faint or pass out.  You have severe symptoms that are different from your first symptoms. MAKE SURE YOU:   Understand these instructions.  Will watch your condition.  Will get help right away if you are not doing well or get worse. Document Released: 07/02/2001 Document Revised: 02/21/2014 Document Reviewed: 06/14/2013 [facility] Patient Information 2015 [place], [place]. This information is not intended to replace advice given to you by your health care provider. Make sure you discuss any questions you have with your health care provider.

## 2015-01-19 NOTE — ED Provider Notes (Signed)
CSN: [id number]     Arrival date & time 01/19/15  4827 History   First MD Initiated Contact with Patient 01/19/15 [phone]     Chief Complaint  Patient presents with  . Migraine     (Consider location/radiation/quality/duration/timing/severity/associated sxs/prior Treatment) HPI Comments: SUBJECTIVE:  [name] is a 44 y.o. female who complains of migraine headache for 12 hour(s). She has a well established history of recurrent migraines.  Description of pain: throbbing pain, unilateral in the right temporal area. Associated symptoms: light sensitivity and nausea. Patient has already taken home meds for this headache without relief.  There are no associated abnormal neurological symptoms such as TIA's, loss of balance, loss of vision or speech, numbness or weakness on review. Past neurological history: negative for stroke, MS, epilepsy, or brain tumor.      Patient is a 44 y.o. female presenting with migraines. The history is provided by the patient.  Migraine Associated symptoms include headaches.    Past Medical History  Diagnosis Date  . Migraine   . Migraine   . UTI (lower urinary tract infection)   . Hypertension    Past Surgical History  Procedure Laterality Date  . Cholecystectomy    . Rotator cuff repair     No family history on file. History  Substance Use Topics  . Smoking status: Never Smoker   . Smokeless tobacco: Never Used  . Alcohol Use: No   OB History    No data available     Review of Systems  Constitutional: Positive for activity change.  Eyes: Negative for visual disturbance.  Gastrointestinal: Positive for nausea.  Musculoskeletal: Negative for neck pain.  Allergic/Immunologic: Negative for immunocompromised state.  Neurological: Positive for headaches. Negative for syncope, facial asymmetry, speech difficulty, weakness, light-headedness and numbness.  Psychiatric/Behavioral: Negative for confusion.      Allergies  Midazolam  hcl  Home Medications    BP 172/102 mmHg  Pulse 69  Temp(Src) 98.8 F (37.1 C) (Oral)  Resp 20  Ht 5\' 4"  (1.626 m)  Wt 140 lb (63.504 kg)  BMI 24.02 kg/m2  SpO2 100%  LMP 12/22/2014 Physical Exam  Constitutional: She is oriented to person, place, and time. She appears well-developed and well-nourished.  HENT:  Head: Normocephalic and atraumatic.  Eyes: EOM are normal. Pupils are equal, round, and reactive to light.  Neck: Neck supple.  Cardiovascular: Normal rate, regular rhythm and normal heart sounds.   No murmur heard. Pulmonary/Chest: Effort normal. No respiratory distress.  Abdominal: Soft. She exhibits no distension. There is no tenderness. There is no rebound and no guarding.  Neurological: She is alert and oriented to person, place, and time. No cranial nerve deficit.  Skin: Skin is warm and dry.  Nursing note and vitals reviewed.   ED Course  Procedures (including critical care time) Labs Review Labs Reviewed - No data to display  Imaging Review No results found.   EKG Interpretation None      MDM   Final diagnoses:  Migraine syndrome    Pt with hx of recurrent migraines comes in with her typical migraine flare up. Pt given ivf, and headache cocktail - and responded well.  She has been advised to call her Neurologist for optimal care. Pt is comfortable going home.  Varney Biles, MD 01/19/15 0600

## 2015-01-19 NOTE — ED Notes (Signed)
Pt c/o migraine headache since 2pm yesterdays with nausea and sensitive to light; states hx of same, twice a week

## 2015-02-06 ENCOUNTER — Emergency Department (HOSPITAL_BASED_OUTPATIENT_CLINIC_OR_DEPARTMENT_OTHER)
Admission: EM | Admit: 2015-02-06 | Discharge: 2015-02-06 | Disposition: A | Discharge status: Home / Self Care | Payer: 59 | Attending: Emergency Medicine | Admitting: Emergency Medicine

## 2015-02-06 ENCOUNTER — Encounter (HOSPITAL_BASED_OUTPATIENT_CLINIC_OR_DEPARTMENT_OTHER): Payer: Self-pay | Admitting: Emergency Medicine

## 2015-02-06 DIAGNOSIS — N644 Mastodynia: Secondary | ICD-10-CM | POA: Insufficient documentation

## 2015-02-06 DIAGNOSIS — Z79899 Other long term (current) drug therapy: Secondary | ICD-10-CM | POA: Diagnosis not present

## 2015-02-06 DIAGNOSIS — G8918 Other acute postprocedural pain: Secondary | ICD-10-CM | POA: Diagnosis not present

## 2015-02-06 DIAGNOSIS — G43909 Migraine, unspecified, not intractable, without status migrainosus: Secondary | ICD-10-CM | POA: Insufficient documentation

## 2015-02-06 DIAGNOSIS — Z87440 Personal history of urinary (tract) infections: Secondary | ICD-10-CM | POA: Insufficient documentation

## 2015-02-06 DIAGNOSIS — Z853 Personal history of malignant neoplasm of breast: Secondary | ICD-10-CM | POA: Diagnosis not present

## 2015-02-06 DIAGNOSIS — I10 Essential (primary) hypertension: Secondary | ICD-10-CM | POA: Insufficient documentation

## 2015-02-06 DIAGNOSIS — Z7951 Long term (current) use of inhaled steroids: Secondary | ICD-10-CM | POA: Diagnosis not present

## 2015-02-06 HISTORY — DX: Malignant neoplasm of unspecified site of unspecified female breast: C50.919

## 2015-02-06 MED ORDER — HYDROCODONE-ACETAMINOPHEN 5-325 MG PO TABS
2.0000 | ORAL_TABLET | Freq: Once | ORAL | Status: AC
  Administered 2015-02-06: 2 via ORAL
  Filled 2015-02-06: qty 2

## 2015-02-06 MED ORDER — HYDROCODONE-ACETAMINOPHEN 5-325 MG PO TABS: 1.0000 | ORAL_TABLET | Freq: Four times a day (QID) | ORAL | Status: DC | PRN

## 2015-02-06 NOTE — Discharge Instructions (Signed)

## 2015-02-06 NOTE — ED Notes (Signed)
Pt amb to room 1 with quick steady gait in nad. Pt reports having left sided lumpectomy on 3/30. Pt states "it seemed to be doing fine, then suddenly on Friday it started to swell and feel hard."  Left breast is swollen and tender to touch, no redness noted, surgical site is well healed. Pt denies any fevers or other c/o.

## 2015-02-06 NOTE — ED Provider Notes (Signed)
CSN: [id number]     Arrival date & time 02/06/15  0758 History   First MD Initiated Contact with Patient 02/06/15 [phone]     Chief Complaint  Patient presents with  . Breast Pain     (Consider location/radiation/quality/duration/timing/severity/associated sxs/prior Treatment) HPI Comments: Breast pain beginning 3 days ago. Worsening, no relief with motrin or with wearing a supportive bra. Feels like breast swelling. Had lumpectomy 3 weeks, pathology was positive for cancer, has f/u in 3 months. Denies fever, SOB, nipple discharge.  Patient is a 44 y.o. female presenting with general illness. The history is provided by the patient.  Illness Location:  Left Breast Quality:  Pain Severity:  Moderate Duration:  3 days Timing:  Constant Progression:  Unchanged Chronicity:  New Context:  Recent L breast lumpectomy 3 weeks ago Relieved by:  Nothing Worsened by:  Nothing Associated symptoms: no abdominal pain, no chest pain, no cough, no diarrhea, no fever, no nausea and no vomiting     Past Medical History  Diagnosis Date  . Migraine   . UTI (lower urinary tract infection)   . Hypertension   . Breast cancer    Past Surgical History  Procedure Laterality Date  . Cholecystectomy    . Rotator cuff repair    . Breast surgery     History reviewed. No pertinent family history. History  Substance Use Topics  . Smoking status: Never Smoker   . Smokeless tobacco: Never Used  . Alcohol Use: No   OB History    No data available     Review of Systems  Constitutional: Negative for fever.  Respiratory: Negative for cough.   Cardiovascular: Negative for chest pain.  Gastrointestinal: Negative for nausea, vomiting, abdominal pain and diarrhea.  All other systems reviewed and are negative.     Allergies  Midazolam hcl  Home Medications   Prior to Admission medications   Medication Sig Start Date End Date Taking? Authorizing Provider  aspirin-acetaminophen-caffeine (EXCEDRIN  MIGRAINE) [phone] MG per tablet Take 2 tablets by mouth every 6 (six) hours as needed. For migraines    Historical Provider, MD  cyclobenzaprine (FLEXERIL) 10 MG tablet Take 0.5 tablets (5 mg total) by mouth 3 (three) times daily as needed for muscle spasms. 11/24/14   Dorie Rank, MD  fluticasone (FLONASE) 50 MCG/ACT nasal spray Place 2 sprays into both nostrils daily. 12/01/14   Robyn M Hess, PA-C  HYDROcodone-homatropine (HYCODAN) 5-1.5 MG/5ML syrup Take 5 mLs by mouth every 6 (six) hours as needed for cough. 12/10/14   Carmin Muskrat, MD  omeprazole (PRILOSEC) 20 MG capsule Take 1 capsule (20 mg total) by mouth daily. 12/10/14 12/31/14  Carmin Muskrat, MD  propranolol (INDERAL) 80 MG tablet Take 80 mg by mouth daily.    Historical Provider, MD  sodium chloride (OCEAN) 0.65 % SOLN nasal spray Place 1 spray into both nostrils as needed for congestion. 12/01/14   Robyn M Hess, PA-C  topiramate (TOPAMAX) 100 MG tablet Take 100 mg by mouth 2 (two) times daily.    Historical Provider, MD  [facility] Name:     Historical Provider, MD  UNABLE TO FIND B/p med for migraine but not sure of the name    Historical Provider, MD   BP 156/97 mmHg  Pulse 80  Temp(Src) 98.5 F (36.9 C) (Oral)  Resp 16  Ht 5\' 4"  (1.626 m)  Wt 140 lb (63.504 kg)  BMI 24.02 kg/m2  SpO2 99%  LMP 01/23/2015 Physical Exam  Constitutional: She is oriented to person, place, and time. She appears well-developed and well-nourished. No distress.  HENT:  Head: Normocephalic and atraumatic.  Mouth/Throat: Oropharynx is clear and moist.  Eyes: EOM are normal. Pupils are equal, round, and reactive to light.  Neck: Normal range of motion. Neck supple.  Cardiovascular: Normal rate and regular rhythm.  Exam reveals no friction rub.   No murmur heard. Pulmonary/Chest: Effort normal and breath sounds normal. No respiratory distress. She has no wheezes. She has no rales. Left breast exhibits tenderness (lower breast. Surgical site  well appearing, no surrounding cellulitis or erythema. No surgical site discharge). Left breast exhibits no inverted nipple, no mass, no nipple discharge and no skin change. Breasts are asymmetrical (L breast mildly swollen in the lower breast area. Skin is not tense.).  Abdominal: Soft. She exhibits no distension. There is no tenderness. There is no rebound.  Musculoskeletal: Normal range of motion. She exhibits no edema.  Neurological: She is alert and oriented to person, place, and time.  Skin: She is not diaphoretic.  Nursing note and vitals reviewed.   ED Course  Procedures (including critical care time) Labs Review Labs Reviewed - No data to display  Imaging Review No results found.   EKG Interpretation None      MDM   Final diagnoses:  Post-operative pain  Breast pain, left    43 year old female here with left breast pain. She is 3 weeks post a left-sided lumpectomy. Lump was behind her nipple. No nipple discharge, fever, or shortness of breath. No relief with Motrin or wear a supportive bra. Here the left breast is mildly swollen but not erythematous or tense. No skin changes or induration. No nipple discharge. No fluctuance or abscess. Will give Vicodin. Will speak with her surgeon in [facility]. I spoke with Dr. Christian Mate, her surgeon. He stated she hasn't followed up and is known to need pain meds from time to time. He can f/u with her next week. Given short course or narcotics for pain control. Stable for discharge.  Evelina Bucy, MD 02/06/15 [phone]

## 2015-04-25 ENCOUNTER — Emergency Department (HOSPITAL_BASED_OUTPATIENT_CLINIC_OR_DEPARTMENT_OTHER)
Admission: EM | Admit: 2015-04-25 | Discharge: 2015-04-25 | Disposition: A | Discharge status: Home / Self Care | Payer: 59 | Attending: Emergency Medicine | Admitting: Emergency Medicine

## 2015-04-25 ENCOUNTER — Encounter (HOSPITAL_BASED_OUTPATIENT_CLINIC_OR_DEPARTMENT_OTHER): Payer: Self-pay

## 2015-04-25 DIAGNOSIS — G43909 Migraine, unspecified, not intractable, without status migrainosus: Secondary | ICD-10-CM | POA: Insufficient documentation

## 2015-04-25 DIAGNOSIS — Z7951 Long term (current) use of inhaled steroids: Secondary | ICD-10-CM | POA: Diagnosis not present

## 2015-04-25 DIAGNOSIS — M545 Low back pain, unspecified: Secondary | ICD-10-CM

## 2015-04-25 DIAGNOSIS — Z87440 Personal history of urinary (tract) infections: Secondary | ICD-10-CM | POA: Diagnosis not present

## 2015-04-25 DIAGNOSIS — Z79899 Other long term (current) drug therapy: Secondary | ICD-10-CM | POA: Diagnosis not present

## 2015-04-25 DIAGNOSIS — Z853 Personal history of malignant neoplasm of breast: Secondary | ICD-10-CM | POA: Insufficient documentation

## 2015-04-25 DIAGNOSIS — I10 Essential (primary) hypertension: Secondary | ICD-10-CM | POA: Diagnosis not present

## 2015-04-25 MED ORDER — DIAZEPAM 5 MG PO TABS: 5.0000 mg | ORAL_TABLET | Freq: Three times a day (TID) | ORAL | Status: DC | PRN

## 2015-04-25 MED ORDER — KETOROLAC TROMETHAMINE 15 MG/ML IJ SOLN
15.0000 mg | Freq: Once | INTRAMUSCULAR | Status: AC
  Administered 2015-04-25: 15 mg via INTRAMUSCULAR
  Filled 2015-04-25: qty 1

## 2015-04-25 MED ORDER — TRAMADOL HCL 50 MG PO TABS: 50.0000 mg | ORAL_TABLET | Freq: Four times a day (QID) | ORAL | Status: DC | PRN

## 2015-04-25 MED ORDER — DIAZEPAM 5 MG PO TABS
2.5000 mg | ORAL_TABLET | Freq: Once | ORAL | Status: AC
  Administered 2015-04-25: 2.5 mg via ORAL
  Filled 2015-04-25: qty 1

## 2015-04-25 MED ORDER — METHOCARBAMOL 500 MG PO TABS: 500.0000 mg | ORAL_TABLET | Freq: Three times a day (TID) | ORAL | Status: DC | PRN

## 2015-04-25 MED ORDER — DEXAMETHASONE 4 MG PO TABS
12.0000 mg | ORAL_TABLET | Freq: Once | ORAL | Status: AC
  Administered 2015-04-25: 12 mg via ORAL
  Filled 2015-04-25: qty 3

## 2015-04-25 MED ORDER — HYDROMORPHONE HCL 1 MG/ML IJ SOLN
1.0000 mg | Freq: Once | INTRAMUSCULAR | Status: AC
  Administered 2015-04-25: 1 mg via INTRAMUSCULAR
  Filled 2015-04-25: qty 1

## 2015-04-25 NOTE — ED Notes (Signed)
MD at bedside. 

## 2015-04-25 NOTE — ED Notes (Signed)
Pt stating robaxin does not work for her, requesting other medication, md notified.

## 2015-04-25 NOTE — Discharge Instructions (Signed)
Back Pain, Adult Low back pain is very common. About 1 in 5 people have back pain.The cause of low back pain is rarely dangerous. The pain often gets better over time.About half of people with a sudden onset of back pain feel better in just 2 weeks. About 8 in 10 people feel better by 6 weeks.  CAUSES Some common causes of back pain include:  Strain of the muscles or ligaments supporting the spine.  Wear and tear (degeneration) of the spinal discs.  Arthritis.  Direct injury to the back. DIAGNOSIS Most of the time, the direct cause of low back pain is not known.However, back pain can be treated effectively even when the exact cause of the pain is unknown.Answering your caregiver's questions about your overall health and symptoms is one of the most accurate ways to make sure the cause of your pain is not dangerous. If your caregiver needs more information, he or she may order lab work or imaging tests (X-rays or MRIs).However, even if imaging tests show changes in your back, this usually does not require surgery. HOME CARE INSTRUCTIONS For many people, back pain returns.Since low back pain is rarely dangerous, it is often a condition that people can learn to manageon their own.   Remain active. It is stressful on the back to sit or stand in one place. Do not sit, drive, or stand in one place for more than 30 minutes at a time. Take short walks on level surfaces as soon as pain allows.Try to increase the length of time you walk each day.  Do not stay in bed.Resting more than 1 or 2 days can delay your recovery.  Do not avoid exercise or work.Your body is made to move.It is not dangerous to be active, even though your back may hurt.Your back will likely heal faster if you return to being active before your pain is gone.  Pay attention to your body when you bend and lift. Many people have less discomfortwhen lifting if they bend their knees, keep the load close to their bodies,and  avoid twisting. Often, the most comfortable positions are those that put less stress on your recovering back.  Find a comfortable position to sleep. Use a firm mattress and lie on your side with your knees slightly bent. If you lie on your back, put a pillow under your knees.  Only take over-the-counter or prescription medicines as directed by your caregiver. Over-the-counter medicines to reduce pain and inflammation are often the most helpful.Your caregiver may prescribe muscle relaxant drugs.These medicines help dull your pain so you can more quickly return to your normal activities and healthy exercise.  Put ice on the injured area.  Put ice in a plastic bag.  Place a towel between your skin and the bag.  Leave the ice on for 15-20 minutes, 03-04 times a day for the first 2 to 3 days. After that, ice and heat may be alternated to reduce pain and spasms.  Ask your caregiver about trying back exercises and gentle massage. This may be of some benefit.  Avoid feeling anxious or stressed.Stress increases muscle tension and can worsen back pain.It is important to recognize when you are anxious or stressed and learn ways to manage it.Exercise is a great option. SEEK MEDICAL CARE IF:  You have pain that is not relieved with rest or medicine.  You have pain that does not improve in 1 week.  You have new symptoms.  You are generally not feeling well. SEEK   IMMEDIATE MEDICAL CARE IF:   You have pain that radiates from your back into your legs.  You develop new bowel or bladder control problems.  You have unusual weakness or numbness in your arms or legs.  You develop nausea or vomiting.  You develop abdominal pain.  You feel faint. Document Released: 10/07/2005 Document Revised: 04/07/2012 Document Reviewed: 02/08/2014 [employer] Patient Information 2015 [employer]. This information is not intended to replace advice given to you by your health care provider. Make sure you  discuss any questions you have with your health care provider.  

## 2015-04-25 NOTE — ED Provider Notes (Signed)
CSN: [id number]     Arrival date & time 04/25/15  0825 History   None    No chief complaint on file.    (Consider location/radiation/quality/duration/timing/severity/associated sxs/prior Treatment) HPI   44 year old female with lower back pain. Gradual onset about 2 days ago. First noticed when she woke up. Denies any specific trauma, overuse or strain. Pain is constant. Does not radiate. Better at rest. Worse with movement. Has tried taking ibuprofen used a heating pad with only mild relief. No fevers or chills. No blood thinners. History of back surgery. No acute numbness, tingling or loss of strength. No urinary complaints.  Past Medical History  Diagnosis Date  . Migraine   . UTI (lower urinary tract infection)   . Hypertension   . Breast cancer    Past Surgical History  Procedure Laterality Date  . Cholecystectomy    . Rotator cuff repair    . Breast surgery     No family history on file. History  Substance Use Topics  . Smoking status: Never Smoker   . Smokeless tobacco: Never Used  . Alcohol Use: No   OB History    No data available     Review of Systems  All systems reviewed and negative, other than as noted in HPI.   Allergies  Midazolam hcl  Home Medications   Prior to Admission medications   Medication Sig Start Date End Date Taking? Authorizing Provider  aspirin-acetaminophen-caffeine (EXCEDRIN MIGRAINE) [phone] MG per tablet Take 2 tablets by mouth every 6 (six) hours as needed. For migraines    Historical Provider, MD  cyclobenzaprine (FLEXERIL) 10 MG tablet Take 0.5 tablets (5 mg total) by mouth 3 (three) times daily as needed for muscle spasms. 11/24/14   Dorie Rank, MD  fluticasone (FLONASE) 50 MCG/ACT nasal spray Place 2 sprays into both nostrils daily. 12/01/14   Carman Ching, PA-C  HYDROcodone-acetaminophen (NORCO/VICODIN) 5-325 MG per tablet Take 1 tablet by mouth every 6 (six) hours as needed for moderate pain. 02/06/15   Evelina Bucy, MD   HYDROcodone-homatropine [facility]) 5-1.5 MG/5ML syrup Take 5 mLs by mouth every 6 (six) hours as needed for cough. 12/10/14   Carmin Muskrat, MD  methocarbamol (ROBAXIN) 500 MG tablet Take 1 tablet (500 mg total) by mouth every 8 (eight) hours as needed for muscle spasms. 04/25/15   Virgel Manifold, MD  omeprazole (PRILOSEC) 20 MG capsule Take 1 capsule (20 mg total) by mouth daily. 12/10/14 12/31/14  Carmin Muskrat, MD  propranolol (INDERAL) 80 MG tablet Take 80 mg by mouth daily.    Historical Provider, MD  sodium chloride (OCEAN) 0.65 % SOLN nasal spray Place 1 spray into both nostrils as needed for congestion. 12/01/14   Robyn M Hess, PA-C  topiramate (TOPAMAX) 100 MG tablet Take 100 mg by mouth 2 (two) times daily.    Historical Provider, MD  traMADol (ULTRAM) 50 MG tablet Take 1 tablet (50 mg total) by mouth every 6 (six) hours as needed. 04/25/15   Virgel Manifold, MD  UNABLE TO FIND Med Name:     Historical Provider, MD  UNABLE TO FIND B/p med for migraine but not sure of the name    Historical Provider, MD   There were no vitals taken for this visit. Physical Exam  Constitutional: She appears well-developed and well-nourished. No distress.  HENT:  Head: Normocephalic and atraumatic.  Eyes: Conjunctivae are normal. Right eye exhibits no discharge. Left eye exhibits no discharge.  Neck: Neck supple.  Cardiovascular: Normal  rate, regular rhythm and normal heart sounds.  Exam reveals no gallop and no friction rub.   No murmur heard. Pulmonary/Chest: Effort normal and breath sounds normal. No respiratory distress.  Abdominal: Soft. She exhibits no distension. There is no tenderness.  Musculoskeletal: She exhibits tenderness. She exhibits no edema.  Mild diffuse tenderness of lumbar region of the back. No CVA tenderness. No concerning skin lesions.  Neurological: She is alert. She exhibits normal muscle tone. Coordination normal.  Strength 5/5 b/l LE. Sensation intact to light touch. Easily  palpable DP pulses.  Skin: Skin is warm and dry.  Psychiatric: She has a normal mood and affect. Her behavior is normal. Thought content normal.  Nursing note and vitals reviewed.   ED Course  Procedures (including critical care time) Labs Review Labs Reviewed - No data to display  Imaging Review No results found.   EKG Interpretation None      MDM   Final diagnoses:  Midline low back pain without sciatica    44 year old female with atraumatic lower back pain. No neurological complaints. Nonfocal neuro exam. No "red flags." Imaging likely very low yield. Plan symptomatic tx. It has been determined that no acute conditions requiring further emergency intervention are present at this time. The patient has been advised of the diagnosis and plan. We have discussed signs and symptoms that warrant return to the ED and they are listed in the discharge instructions.      Virgel Manifold, MD 04/25/15 [phone]

## 2015-04-25 NOTE — ED Notes (Signed)
Pt reports lower lumbar pain without radiation down legs, no n/t.  Full bowel/bladder control.

## 2015-05-09 ENCOUNTER — Emergency Department (HOSPITAL_BASED_OUTPATIENT_CLINIC_OR_DEPARTMENT_OTHER)
Admission: EM | Admit: 2015-05-09 | Discharge: 2015-05-09 | Disposition: A | Discharge status: Home / Self Care | Payer: 59 | Attending: Emergency Medicine | Admitting: Emergency Medicine

## 2015-05-09 ENCOUNTER — Encounter (HOSPITAL_BASED_OUTPATIENT_CLINIC_OR_DEPARTMENT_OTHER): Payer: Self-pay | Admitting: *Deleted

## 2015-05-09 DIAGNOSIS — Z8679 Personal history of other diseases of the circulatory system: Secondary | ICD-10-CM | POA: Diagnosis not present

## 2015-05-09 DIAGNOSIS — Z87440 Personal history of urinary (tract) infections: Secondary | ICD-10-CM | POA: Diagnosis not present

## 2015-05-09 DIAGNOSIS — Z853 Personal history of malignant neoplasm of breast: Secondary | ICD-10-CM | POA: Insufficient documentation

## 2015-05-09 DIAGNOSIS — I10 Essential (primary) hypertension: Secondary | ICD-10-CM | POA: Insufficient documentation

## 2015-05-09 DIAGNOSIS — R197 Diarrhea, unspecified: Secondary | ICD-10-CM | POA: Diagnosis not present

## 2015-05-09 DIAGNOSIS — R1084 Generalized abdominal pain: Secondary | ICD-10-CM | POA: Insufficient documentation

## 2015-05-09 DIAGNOSIS — M545 Low back pain, unspecified: Secondary | ICD-10-CM

## 2015-05-09 DIAGNOSIS — Z3202 Encounter for pregnancy test, result negative: Secondary | ICD-10-CM | POA: Diagnosis not present

## 2015-05-09 DIAGNOSIS — R112 Nausea with vomiting, unspecified: Secondary | ICD-10-CM | POA: Diagnosis present

## 2015-05-09 LAB — URINE MICROSCOPIC-ADD ON

## 2015-05-09 LAB — CBC
HCT: 42.4 % (ref 36.0–46.0)
HEMOGLOBIN: 14.7 g/dL (ref 12.0–15.0)
MCH: 29.4 pg (ref 26.0–34.0)
MCHC: 34.7 g/dL (ref 30.0–36.0)
MCV: 84.8 fL (ref 78.0–100.0)
PLATELETS: 224 10*3/uL (ref 150–400)
RBC: 5 MIL/uL (ref 3.87–5.11)
RDW: 12.7 % (ref 11.5–15.5)
WBC: 6.7 10*3/uL (ref 4.0–10.5)

## 2015-05-09 LAB — COMPREHENSIVE METABOLIC PANEL
ALT: 25 U/L (ref 14–54)
ANION GAP: 8 (ref 5–15)
AST: 26 U/L (ref 15–41)
Albumin: 4 g/dL (ref 3.5–5.0)
Alkaline Phosphatase: 85 U/L (ref 38–126)
BILIRUBIN TOTAL: 1 mg/dL (ref 0.3–1.2)
BUN: 12 mg/dL (ref 6–20)
CHLORIDE: 103 mmol/L (ref 101–111)
CO2: 25 mmol/L (ref 22–32)
Calcium: 8.7 mg/dL — ABNORMAL LOW (ref 8.9–10.3)
Creatinine, Ser: 0.58 mg/dL (ref 0.44–1.00)
GFR calc Af Amer: >60 mL/min (ref >60)
Glucose, Bld: 99 mg/dL (ref 65–99)
POTASSIUM: 3.5 mmol/L (ref 3.5–5.1)
Sodium: 136 mmol/L (ref 135–145)
TOTAL PROTEIN: 7.3 g/dL (ref 6.5–8.1)

## 2015-05-09 LAB — URINALYSIS, ROUTINE W REFLEX MICROSCOPIC
BILIRUBIN URINE: NEGATIVE
Glucose, UA: NEGATIVE mg/dL
Hgb urine dipstick: NEGATIVE
KETONES UR: NEGATIVE mg/dL
NITRITE: NEGATIVE
PH: 6 (ref 5.0–8.0)
PROTEIN: NEGATIVE mg/dL
Specific Gravity, Urine: 1.026 (ref 1.005–1.030)
UROBILINOGEN UA: 0.2 mg/dL (ref 0.0–1.0)

## 2015-05-09 LAB — LIPASE, BLOOD: Lipase: 16 U/L — ABNORMAL LOW (ref 22–51)

## 2015-05-09 LAB — PREGNANCY, URINE: PREG TEST UR: NEGATIVE

## 2015-05-09 MED ORDER — ONDANSETRON HCL 4 MG/2ML IJ SOLN
4.0000 mg | Freq: Once | INTRAMUSCULAR | Status: AC
  Administered 2015-05-09: 4 mg via INTRAVENOUS
  Filled 2015-05-09: qty 2

## 2015-05-09 MED ORDER — SODIUM CHLORIDE 0.9 % IV BOLUS (SEPSIS)
1000.0000 mL | Freq: Once | INTRAVENOUS | Status: AC
  Administered 2015-05-09: 1000 mL via INTRAVENOUS

## 2015-05-09 MED ORDER — ONDANSETRON HCL 4 MG PO TABS: 4.0000 mg | ORAL_TABLET | Freq: Four times a day (QID) | ORAL | Status: DC

## 2015-05-09 MED ORDER — KETOROLAC TROMETHAMINE 30 MG/ML IJ SOLN
30.0000 mg | Freq: Once | INTRAMUSCULAR | Status: AC
  Administered 2015-05-09: 30 mg via INTRAVENOUS
  Filled 2015-05-09: qty 1

## 2015-05-09 MED ORDER — DICYCLOMINE HCL 20 MG PO TABS
20.0000 mg | ORAL_TABLET | Freq: Two times a day (BID) | ORAL | Status: DC
Start: 2015-05-09 — End: 2015-06-21

## 2015-05-09 NOTE — ED Notes (Signed)
MD at bedside. 

## 2015-05-09 NOTE — ED Provider Notes (Signed)
CSN: [id number]     Arrival date & time 05/09/15  1236 History   First MD Initiated Contact with Patient 05/09/15 1304     Chief Complaint  Patient presents with  . Abdominal Pain     (Consider location/radiation/quality/duration/timing/severity/associated sxs/prior Treatment) HPI  Pt presenting with c/o vomiting, diarrhea and abdominal cramping.  Pt states symptoms began 2 days ago.  She has not had further vomiting since 2 nights ago but continues to have diarrhea- watery- no blood or mucous.  Does have diffuse abdominal cramping.  Denies having urinary symptoms.  No fever/chills.  She ate out at a restaurant on the evening that the symptoms began.  No other contacts have similar symptoms.  She has been able to drink liquids, but trying to eat causes her stomach to cramp and be uncomfortable.  No vaginal bleeding or discharge, no dysuria.  There are no other associated systemic symptoms, there are no other alleviating or modifying factors.   Past Medical History  Diagnosis Date  . Migraine   . UTI (lower urinary tract infection)   . Hypertension   . Breast cancer    Past Surgical History  Procedure Laterality Date  . Cholecystectomy    . Rotator cuff repair    . Breast surgery     No family history on file. History  Substance Use Topics  . Smoking status: Never Smoker   . Smokeless tobacco: Never Used  . Alcohol Use: Yes   OB History    No data available     Review of Systems  ROS reviewed and all otherwise negative except for mentioned in HPI    Allergies  Midazolam hcl  Home Medications  Meds reviewed on chart as entered by nursing staff.   BP 134/79 mmHg  Pulse 84  Temp(Src) 98.4 F (36.9 C) (Oral)  Resp 18  Ht 5\' 1"  (1.549 m)  Wt 135 lb (61.236 kg)  BMI 25.52 kg/m2  SpO2 100%  LMP 04/16/2015  Vitals reviewed Physical Exam  Physical Examination: General appearance - alert, well appearing, and in no distress Mental status - alert, oriented to person,  place, and time Eyes - no conjunctival injection no scleral icterus Mouth - mucous membranes moist, pharynx normal without lesions Chest - clear to auscultation, no wheezes, rales or rhonchi, symmetric air entry Heart - normal rate, regular rhythm, normal S1, S2, no murmurs, rubs, clicks or gallops Abdomen - soft, mild diffuse tenderness, no gaurding or rebound, nabs, nondistended, no masses or organomegaly, nabs Back- no CVA tenderness, no midline tenderness to palpation Neurological - alert, oriented Extremities - peripheral pulses normal, no pedal edema, no clubbing or cyanosis Skin - normal coloration and turgor, no rashes  ED Course  Procedures (including critical care time) Labs Review Labs Reviewed  URINALYSIS, ROUTINE W REFLEX MICROSCOPIC (NOT AT [facility]) - Abnormal; Notable for the following:    Leukocytes, UA MODERATE (*)    All other components within normal limits  COMPREHENSIVE METABOLIC PANEL - Abnormal; Notable for the following:    Calcium 8.7 (*)    All other components within normal limits  LIPASE, BLOOD - Abnormal; Notable for the following:    Lipase 16 (*)    All other components within normal limits  URINE MICROSCOPIC-ADD ON - Abnormal; Notable for the following:    Squamous Epithelial / LPF FEW (*)    All other components within normal limits  PREGNANCY, URINE  CBC    Imaging Review No results found.  EKG Interpretation None      MDM   Final diagnoses:  Nausea vomiting and diarrhea  Midline low back pain without sciatica    Pt presenting with c/o vomiting, diarrhea and abdominal cramping, symptoms began 2 days ago.  Vomiting has improved, but diarrhea continues.  Pt feels improved after IV fluids and meds in the ED.  toradol has improved headache and low back pain.  She is able to drink po fluids in the ED.  No significant abdominal tenderness on exam- doubt appendicitis, colitis, cholecystitis, SBO or other acute emergent condition at this time.   Discussed with patient that if diarrhea continues x 7 days then would be time to obtain stool culture.  Pt to continue po fluids.  Discharged with strict return precautions.  Pt agreeable with plan.    Alfonzo Beers, MD 05/10/15 [phone]

## 2015-05-09 NOTE — Discharge Instructions (Signed)
Return to the ED with any concerns including vomiting and not able to keep down liquids, worsening abdominal pain- especially if it localizes to the right lower abdomen, fever/chills, blood in stool, fainting, decreased level of alertness/lethargy, or any other alarming symptoms

## 2015-05-09 NOTE — ED Notes (Signed)
Abdominal pain, diarrhea and vomiting x 2 days. States she thinks she has food poisoning. Headache and back pain.

## 2015-06-21 ENCOUNTER — Encounter (HOSPITAL_BASED_OUTPATIENT_CLINIC_OR_DEPARTMENT_OTHER): Payer: Self-pay

## 2015-06-21 ENCOUNTER — Emergency Department (HOSPITAL_BASED_OUTPATIENT_CLINIC_OR_DEPARTMENT_OTHER)
Admission: EM | Admit: 2015-06-21 | Discharge: 2015-06-22 | Disposition: A | Discharge status: Home / Self Care | Payer: 59 | Attending: Emergency Medicine | Admitting: Emergency Medicine

## 2015-06-21 DIAGNOSIS — Z853 Personal history of malignant neoplasm of breast: Secondary | ICD-10-CM | POA: Diagnosis not present

## 2015-06-21 DIAGNOSIS — Z87440 Personal history of urinary (tract) infections: Secondary | ICD-10-CM | POA: Diagnosis not present

## 2015-06-21 DIAGNOSIS — G43909 Migraine, unspecified, not intractable, without status migrainosus: Secondary | ICD-10-CM | POA: Insufficient documentation

## 2015-06-21 DIAGNOSIS — I10 Essential (primary) hypertension: Secondary | ICD-10-CM | POA: Diagnosis not present

## 2015-06-21 DIAGNOSIS — Z79899 Other long term (current) drug therapy: Secondary | ICD-10-CM | POA: Diagnosis not present

## 2015-06-21 DIAGNOSIS — M542 Cervicalgia: Secondary | ICD-10-CM | POA: Diagnosis present

## 2015-06-21 MED ORDER — CYCLOBENZAPRINE HCL 10 MG PO TABS
5.0000 mg | ORAL_TABLET | Freq: Once | ORAL | Status: AC
  Administered 2015-06-21: 5 mg via ORAL
  Filled 2015-06-21: qty 1

## 2015-06-21 MED ORDER — OXYCODONE-ACETAMINOPHEN 5-325 MG PO TABS
1.0000 | ORAL_TABLET | Freq: Once | ORAL | Status: AC
  Administered 2015-06-21: 1 via ORAL
  Filled 2015-06-21: qty 1

## 2015-06-21 MED ORDER — KETOROLAC TROMETHAMINE 60 MG/2ML IM SOLN
60.0000 mg | Freq: Once | INTRAMUSCULAR | Status: AC
  Administered 2015-06-21: 60 mg via INTRAMUSCULAR
  Filled 2015-06-21: qty 2

## 2015-06-21 NOTE — ED Notes (Signed)
Pt c/o right side neck pain and tightness after sleeping on it wrong.  She states she has tried ibuprofen and vicodin without relief.

## 2015-06-21 NOTE — ED Notes (Addendum)
Woke yesterday morning with pain to right side of neck-denies injury/fever-pain worse with movement-pt with steady gait to triage

## 2015-06-21 NOTE — ED Provider Notes (Signed)
CSN: [id number]     Arrival date & time 06/21/15  2135 History   This chart was scribed for Merryl Hacker, MD by Forrestine Him, ED Scribe. This patient was seen in room MH02/MH02 and the patient's care was started 11:25 PM.   Chief Complaint  Patient presents with  . Neck Pain   The history is provided by the patient. No language interpreter was used.    HPI Comments: [name] is a 44 y.o. female who presents to the Emergency Department complaining of constant, ongoing, unchanged neck pain onset yesterday morning. Currently she rates pain 6/10. No previous history of neck pain. No recently injury, trauma, or heavy lifting. Discomfort is worsened with movement of the neck. No alleviating factors at this time. OTC Motrin and prescribed Vicodin attempted prior to arrival without any improvement for discomfort. No recent fever, chills, chest pain, or shortness of breath. No weakness, loss of sensation, or numbness. Pt with known allergies to Midazolam HCL.  Past Medical History  Diagnosis Date  . Migraine   . UTI (lower urinary tract infection)   . Hypertension   . Breast cancer    Past Surgical History  Procedure Laterality Date  . Cholecystectomy    . Rotator cuff repair    . Breast surgery     No family history on file. Social History  Substance Use Topics  . Smoking status: Never Smoker   . Smokeless tobacco: Never Used  . Alcohol Use: Yes   OB History    No data available     Review of Systems  Constitutional: Negative for fever and chills.  Respiratory: Negative for cough and shortness of breath.   Gastrointestinal: Negative for nausea, vomiting and abdominal pain.  Musculoskeletal: Positive for neck pain. Negative for back pain.  Neurological: Negative for weakness, numbness and headaches.  Psychiatric/Behavioral: Negative for confusion.  All other systems reviewed and are negative.     Allergies  Midazolam hcl  Home Medications   Prior to  Admission medications   Medication Sig Start Date End Date Taking? Authorizing Provider  cyclobenzaprine (FLEXERIL) 10 MG tablet Take 1 tablet (10 mg total) by mouth 2 (two) times daily as needed for muscle spasms. 06/22/15   Merryl Hacker, MD  methocarbamol (ROBAXIN) 500 MG tablet Take 1 tablet (500 mg total) by mouth every 8 (eight) hours as needed for muscle spasms. 04/25/15   Virgel Manifold, MD  naproxen (NAPROSYN) 500 MG tablet Take 1 tablet (500 mg total) by mouth 2 (two) times daily. 06/22/15   Merryl Hacker, MD  omeprazole (PRILOSEC) 20 MG capsule Take 1 capsule (20 mg total) by mouth daily. 12/10/14 12/31/14  Carmin Muskrat, MD  oxyCODONE-acetaminophen (PERCOCET/ROXICET) 5-325 MG per tablet Take 1 tablet by mouth every 6 (six) hours as needed for severe pain. 06/22/15   Merryl Hacker, MD  topiramate (TOPAMAX) 100 MG tablet Take 100 mg by mouth 2 (two) times daily.    Historical Provider, MD   Triage Vitals: BP 137/87 mmHg  Pulse 76  Temp(Src) 98.5 F (36.9 C) (Oral)  Resp 16  Ht 5\' 1"  (1.549 m)  Wt 135 lb (61.236 kg)  BMI 25.52 kg/m2  SpO2 100%  LMP 06/03/2015   Physical Exam  Constitutional: She is oriented to person, place, and time. She appears well-developed and well-nourished. No distress.  HENT:  Head: Normocephalic and atraumatic.  Eyes: Pupils are equal, round, and reactive to light.  Neck: Normal range of motion. Neck  supple.  Tenderness is spasm noted over the right rhomboid and sternocleidomastoid, no rigidity  Cardiovascular: Normal rate, regular rhythm and normal heart sounds.   Pulmonary/Chest: Effort normal. No respiratory distress. She has no wheezes.  Lymphadenopathy:    She has no cervical adenopathy.  Neurological: She is alert and oriented to person, place, and time.  5 out of 5 strength bilateral upper extremities  Skin: Skin is warm and dry.  Psychiatric: She has a normal mood and affect.  Nursing note and vitals reviewed.   ED Course   Procedures (including critical care time)  DIAGNOSTIC STUDIES: Oxygen Saturation is 100% on RA, Normal by my interpretation.    COORDINATION OF CARE: 11:31 PM- Will give Flexeril, Toradol, and Percocet. Discussed treatment plan with pt at bedside and pt agreed to plan.     Labs Review Labs Reviewed - No data to display  Imaging Review No results found. I have personally reviewed and evaluated these images and lab results as part of my medical decision-making.   EKG Interpretation None      MDM   Final diagnoses:  Cervicalgia    Patient presents with neck pain. Awoke with neck pain. Nontoxic on exam. Nonfocal. Spasm noted in the right neck musculature. Patient given Flexeril. No indication for imaging at this time. Will be discharged with Flexeril, naproxen, and a short course of oxycodone.  After history, exam, and medical workup I feel the patient has been appropriately medically screened and is safe for discharge home. Pertinent diagnoses were discussed with the patient. Patient was given return precautions.   I personally performed the services described in this documentation, which was scribed in my presence. The recorded information has been reviewed and is accurate.   Merryl Hacker, MD 06/22/15 [id number]

## 2015-06-21 NOTE — ED Notes (Signed)
MD at bedside. 

## 2015-06-22 MED ORDER — NAPROXEN 500 MG PO TABS: 500.0000 mg | ORAL_TABLET | Freq: Two times a day (BID) | ORAL | Status: DC

## 2015-06-22 MED ORDER — CYCLOBENZAPRINE HCL 10 MG PO TABS: 10.0000 mg | ORAL_TABLET | Freq: Two times a day (BID) | ORAL | Status: DC | PRN

## 2015-06-22 MED ORDER — OXYCODONE-ACETAMINOPHEN 5-325 MG PO TABS: 1.0000 | ORAL_TABLET | Freq: Four times a day (QID) | ORAL | Status: DC | PRN

## 2015-06-22 NOTE — ED Notes (Signed)
Pt verbalizes understanding of d/c instructions and denies any further needs at this time. 

## 2015-06-22 NOTE — Discharge Instructions (Signed)
Cervical Sprain A cervical sprain is an injury in the neck in which the strong, fibrous tissues (ligaments) that connect your neck bones stretch or tear. Cervical sprains can range from mild to severe. Severe cervical sprains can cause the neck vertebrae to be unstable. This can lead to damage of the spinal cord and can result in serious nervous system problems. The amount of time it takes for a cervical sprain to get better depends on the cause and extent of the injury. Most cervical sprains heal in 1 to 3 weeks. CAUSES  Severe cervical sprains may be caused by:   Contact sport injuries (such as from football, rugby, wrestling, hockey, auto racing, gymnastics, diving, martial arts, or boxing).   Motor vehicle collisions.   Whiplash injuries. This is an injury from a sudden forward and backward whipping movement of the head and neck.  Falls.  Mild cervical sprains may be caused by:   Being in an awkward position, such as while cradling a telephone between your ear and shoulder.   Sitting in a chair that does not offer proper support.   Working at a poorly [employer]scape [employer] station.   Looking up or down for long periods of time.  SYMPTOMS   Pain, soreness, stiffness, or a burning sensation in the front, back, or sides of the neck. This discomfort may develop immediately after the injury or slowly, 24 hours or more after the injury.   Pain or tenderness directly in the middle of the back of the neck.   Shoulder or upper back pain.   Limited ability to move the neck.   Headache.   Dizziness.   Weakness, numbness, or tingling in the hands or arms.   Muscle spasms.   Difficulty swallowing or chewing.   Tenderness and swelling of the neck.  DIAGNOSIS  Most of the time your health care provider can diagnose a cervical sprain by taking your history and doing a physical exam. Your health care provider will ask about previous neck injuries and any known neck  problems, such as arthritis in the neck. X-rays may be taken to find out if there are any other problems, such as with the bones of the neck. Other tests, such as a CT scan or MRI, may also be needed.  TREATMENT  Treatment depends on the severity of the cervical sprain. Mild sprains can be treated with rest, keeping the neck in place (immobilization), and pain medicines. Severe cervical sprains are immediately immobilized. Further treatment is done to help with pain, muscle spasms, and other symptoms and may include:  Medicines, such as pain relievers, numbing medicines, or muscle relaxants.   Physical therapy. This may involve stretching exercises, strengthening exercises, and posture training. Exercises and improved posture can help stabilize the neck, strengthen muscles, and help stop symptoms from returning.  HOME CARE INSTRUCTIONS   Put ice on the injured area.   Put ice in a plastic bag.   Place a towel between your skin and the bag.   Leave the ice on for 15-20 minutes, 3-4 times a day.   If your injury was severe, you may have been given a cervical collar to wear. A cervical collar is a two-piece collar designed to keep your neck from moving while it heals.  Do not remove the collar unless instructed by your health care provider.  If you have long hair, keep it outside of the collar.  Ask your health care provider before making any adjustments to your collar. Minor  adjustments may be required over time to improve comfort and reduce pressure on your chin or on the back of your head. °¨ If you are allowed to remove the collar for cleaning or bathing, follow your health care provider's instructions on how to do so safely. °¨ Keep your collar clean by wiping it with mild soap and water and drying it completely. If the collar you have been given includes removable pads, remove them every 1-2 days and hand wash them with soap and water. Allow them to air dry. They should be completely  dry before you wear them in the collar. °¨ If you are allowed to remove the collar for cleaning and bathing, wash and dry the skin of your neck. Check your skin for irritation or sores. If you see any, tell your health care provider. °¨ Do not drive while wearing the collar.   °· Only take over-the-counter or prescription medicines for pain, discomfort, or fever as directed by your health care provider.   °· Keep all follow-up appointments as directed by your health care provider.   °· Keep all physical therapy appointments as directed by your health care provider.   °· Make any needed adjustments to your workstation to promote good posture.   °· Avoid positions and activities that make your symptoms worse.   °· Warm up and stretch before being active to help prevent problems.   °SEEK MEDICAL CARE IF:  °· Your pain is not controlled with medicine.   °· You are unable to decrease your pain medicine over time as planned.   °· Your activity level is not improving as expected.   °SEEK IMMEDIATE MEDICAL CARE IF:  °· You develop any bleeding. °· You develop stomach upset. °· You have signs of an allergic reaction to your medicine.   °· Your symptoms get worse.   °· You develop new, unexplained symptoms.   °· You have numbness, tingling, weakness, or paralysis in any part of your body.   °MAKE SURE YOU:  °· Understand these instructions. °· Will watch your condition. °· Will get help right away if you are not doing well or get worse. °Document Released: 08/04/2007 Document Revised: 10/12/2013 Document Reviewed: 04/14/2013 °Exit[employer]® Patient Information ©2015 [employer]. This information is not intended to replace advice given to you by your health care provider. Make sure you discuss any questions you have with your health care provider. ° °

## 2015-06-30 ENCOUNTER — Emergency Department (HOSPITAL_BASED_OUTPATIENT_CLINIC_OR_DEPARTMENT_OTHER)
Admission: EM | Admit: 2015-06-30 | Discharge: 2015-06-30 | Disposition: A | Discharge status: Home / Self Care | Payer: 59 | Attending: Emergency Medicine | Admitting: Emergency Medicine

## 2015-06-30 ENCOUNTER — Encounter (HOSPITAL_BASED_OUTPATIENT_CLINIC_OR_DEPARTMENT_OTHER): Payer: Self-pay | Admitting: *Deleted

## 2015-06-30 DIAGNOSIS — I10 Essential (primary) hypertension: Secondary | ICD-10-CM | POA: Diagnosis not present

## 2015-06-30 DIAGNOSIS — J029 Acute pharyngitis, unspecified: Secondary | ICD-10-CM | POA: Diagnosis present

## 2015-06-30 DIAGNOSIS — Z87440 Personal history of urinary (tract) infections: Secondary | ICD-10-CM | POA: Diagnosis not present

## 2015-06-30 DIAGNOSIS — Z853 Personal history of malignant neoplasm of breast: Secondary | ICD-10-CM | POA: Insufficient documentation

## 2015-06-30 DIAGNOSIS — Z79899 Other long term (current) drug therapy: Secondary | ICD-10-CM | POA: Diagnosis not present

## 2015-06-30 DIAGNOSIS — G43909 Migraine, unspecified, not intractable, without status migrainosus: Secondary | ICD-10-CM | POA: Insufficient documentation

## 2015-06-30 DIAGNOSIS — Z791 Long term (current) use of non-steroidal anti-inflammatories (NSAID): Secondary | ICD-10-CM | POA: Diagnosis not present

## 2015-06-30 DIAGNOSIS — J020 Streptococcal pharyngitis: Secondary | ICD-10-CM | POA: Diagnosis not present

## 2015-06-30 LAB — RAPID STREP SCREEN (MED CTR MEBANE ONLY): STREPTOCOCCUS, GROUP A SCREEN (DIRECT): POSITIVE — AB

## 2015-06-30 MED ORDER — KETOROLAC TROMETHAMINE 60 MG/2ML IM SOLN
60.0000 mg | Freq: Once | INTRAMUSCULAR | Status: AC
  Administered 2015-06-30: 60 mg via INTRAMUSCULAR
  Filled 2015-06-30: qty 2

## 2015-06-30 MED ORDER — PROMETHAZINE HCL 25 MG/ML IJ SOLN
25.0000 mg | Freq: Once | INTRAMUSCULAR | Status: AC
  Administered 2015-06-30: 25 mg via INTRAMUSCULAR
  Filled 2015-06-30: qty 1

## 2015-06-30 MED ORDER — PENICILLIN G BENZATHINE 1200000 UNIT/2ML IM SUSP
1.2000 10*6.[IU] | Freq: Once | INTRAMUSCULAR | Status: AC
  Administered 2015-06-30: 1.2 10*6.[IU] via INTRAMUSCULAR
  Filled 2015-06-30: qty 2

## 2015-06-30 MED ORDER — IBUPROFEN 800 MG PO TABS
800.0000 mg | ORAL_TABLET | Freq: Once | ORAL | Status: AC
Start: 2015-06-30 — End: 2015-06-30
  Administered 2015-06-30: 800 mg via ORAL
  Filled 2015-06-30: qty 1

## 2015-06-30 NOTE — ED Provider Notes (Signed)
CSN: [id number]     Arrival date & time 06/30/15  0411 History   None    Chief Complaint  Patient presents with  . Sore Throat     (Consider location/radiation/quality/duration/timing/severity/associated sxs/prior Treatment) HPI Comments: Patient is a 44 year old female with history of migraines, hypertension. She presents today with complaints of sore throat that started yesterday. She reports low-grade fevers. She denies any ill contacts.  Patient is a 44 y.o. female presenting with pharyngitis. The history is provided by the patient.  Sore Throat This is a new problem. The current episode started yesterday. The problem occurs constantly. The problem has been rapidly worsening. The symptoms are aggravated by swallowing. Nothing relieves the symptoms. She has tried nothing for the symptoms. The treatment provided no relief.    Past Medical History  Diagnosis Date  . Migraine   . UTI (lower urinary tract infection)   . Hypertension   . Breast cancer    Past Surgical History  Procedure Laterality Date  . Cholecystectomy    . Rotator cuff repair    . Breast surgery     No family history on file. Social History  Substance Use Topics  . Smoking status: Never Smoker   . Smokeless tobacco: Never Used  . Alcohol Use: Yes   OB History    No data available     Review of Systems  All other systems reviewed and are negative.     Allergies  Midazolam hcl  Home Medications   Prior to Admission medications   Medication Sig Start Date End Date Taking? Authorizing Provider  cyclobenzaprine (FLEXERIL) 10 MG tablet Take 1 tablet (10 mg total) by mouth 2 (two) times daily as needed for muscle spasms. 06/22/15   Merryl Hacker, MD  methocarbamol (ROBAXIN) 500 MG tablet Take 1 tablet (500 mg total) by mouth every 8 (eight) hours as needed for muscle spasms. 04/25/15   Virgel Manifold, MD  naproxen (NAPROSYN) 500 MG tablet Take 1 tablet (500 mg total) by mouth 2 (two) times daily.  06/22/15   Merryl Hacker, MD  omeprazole (PRILOSEC) 20 MG capsule Take 1 capsule (20 mg total) by mouth daily. 12/10/14 12/31/14  Carmin Muskrat, MD  oxyCODONE-acetaminophen (PERCOCET/ROXICET) 5-325 MG per tablet Take 1 tablet by mouth every 6 (six) hours as needed for severe pain. 06/22/15   Merryl Hacker, MD  topiramate (TOPAMAX) 100 MG tablet Take 100 mg by mouth 2 (two) times daily.    Historical Provider, MD   BP 160/108 mmHg  Pulse 124  Temp(Src) 101.5 F (38.6 C) (Oral)  Resp 20  Ht 5\' 4"  (1.626 m)  Wt 135 lb (61.236 kg)  BMI 23.16 kg/m2  SpO2 98%  LMP 06/03/2015 Physical Exam  Constitutional: She is oriented to person, place, and time. She appears well-developed and well-nourished.  Patient presents here exceedingly anxious. She is screaming and carrying on quite dramatically.  HENT:  Head: Normocephalic and atraumatic.  Mouth/Throat: No oropharyngeal exudate.  There is mild erythema of the posterior oropharynx. There are no exudates.  Neck: Normal range of motion. Neck supple.  Cardiovascular: Normal rate, regular rhythm and normal heart sounds.   No murmur heard. Pulmonary/Chest: Effort normal and breath sounds normal. No stridor. No respiratory distress. She has no wheezes.  Abdominal: Soft. Bowel sounds are normal.  Musculoskeletal: Normal range of motion. She exhibits no edema.  Lymphadenopathy:    She has no cervical adenopathy.  Neurological: She is alert and oriented to person, place,  and time.  Skin: Skin is warm and dry.  Nursing note and vitals reviewed.   ED Course  Procedures (including critical care time) Labs Review Labs Reviewed  RAPID STREP SCREEN (NOT AT [facility])    Imaging Review No results found. I have personally reviewed and evaluated these images and lab results as part of my medical decision-making.   EKG Interpretation None      MDM   Final diagnoses:  None    Strep test positive.  Will treat with bicillin, motrin, prn  return.    Veryl Speak, MD 06/30/15 [id number]

## 2015-06-30 NOTE — ED Notes (Signed)
C/o sore throat, onset yesterday, also fever. Took tylenol 2 hrs ago. Voice hoarse. 8/10. Pt crying and emotional. No dyspnea noted. Alert, steady gait. Family at side.

## 2015-06-30 NOTE — ED Notes (Signed)
Dr. Stark Jock in to see pt during triage.

## 2015-06-30 NOTE — Discharge Instructions (Signed)
Ibuprofen 600 mg every six hours as needed for pain or fever.   Strep Throat Strep throat is an infection of the throat caused by a bacteria named Streptococcus pyogenes. Your health care provider may call the infection streptococcal "tonsillitis" or "pharyngitis" depending on whether there are signs of inflammation in the tonsils or back of the throat. Strep throat is most common in children aged 44-15 years during the cold months of the year, but it can occur in people of any age during any season. This infection is spread from person to person (contagious) through coughing, sneezing, or other close contact. SIGNS AND SYMPTOMS   Fever or chills.  Painful, swollen, red tonsils or throat.  Pain or difficulty when swallowing.  White or yellow spots on the tonsils or throat.  Swollen, tender lymph nodes or "glands" of the neck or under the jaw.  Red rash all over the body (rare). DIAGNOSIS  Many different infections can cause the same symptoms. A test must be done to confirm the diagnosis so the right treatment can be given. A "rapid strep test" can help your health care provider make the diagnosis in a few minutes. If this test is not available, a light swab of the infected area can be used for a throat culture test. If a throat culture test is done, results are usually available in a day or two. TREATMENT  Strep throat is treated with antibiotic medicine. HOME CARE INSTRUCTIONS   Gargle with 1 tsp of salt in 1 cup of warm water, 3-4 times per day or as needed for comfort.  Family members who also have a sore throat or fever should be tested for strep throat and treated with antibiotics if they have the strep infection.  Make sure everyone in your household washes their hands well.  Do not share food, drinking cups, or personal items that could cause the infection to spread to others.  You may need to eat a soft food diet until your sore throat gets better.  Drink enough water and  fluids to keep your urine clear or pale yellow. This will help prevent dehydration.  Get plenty of rest.  Stay home from school, day care, or work until you have been on antibiotics for 24 hours.  Take medicines only as directed by your health care provider.  Take your antibiotic medicine as directed by your health care provider. Finish it even if you start to feel better. SEEK MEDICAL CARE IF:   The glands in your neck continue to enlarge.  You develop a rash, cough, or earache.  You cough up green, yellow-brown, or bloody sputum.  You have pain or discomfort not controlled by medicines.  Your problems seem to be getting worse rather than better.  You have a fever. SEEK IMMEDIATE MEDICAL CARE IF:   You develop any new symptoms such as vomiting, severe headache, stiff or painful neck, chest pain, shortness of breath, or trouble swallowing.  You develop severe throat pain, drooling, or changes in your voice.  You develop swelling of the neck, or the skin on the neck becomes red and tender.  You develop signs of dehydration, such as fatigue, dry mouth, and decreased urination.  You become increasingly sleepy, or you cannot wake up completely. MAKE SURE YOU:  Understand these instructions.  Will watch your condition.  Will get help right away if you are not doing well or get worse. Document Released: 10/04/2000 Document Revised: 02/21/2014 Document Reviewed: 12/06/2010 Exit[employer] Patient Information 2015  [employer]. This information is not intended to replace advice given to you by your health care provider. Make sure you discuss any questions you have with your health care provider. ° °

## 2015-12-17 ENCOUNTER — Encounter (HOSPITAL_BASED_OUTPATIENT_CLINIC_OR_DEPARTMENT_OTHER): Payer: Self-pay | Admitting: *Deleted

## 2015-12-17 ENCOUNTER — Emergency Department (HOSPITAL_BASED_OUTPATIENT_CLINIC_OR_DEPARTMENT_OTHER): Payer: 59

## 2015-12-17 ENCOUNTER — Emergency Department (HOSPITAL_BASED_OUTPATIENT_CLINIC_OR_DEPARTMENT_OTHER)
Admission: EM | Admit: 2015-12-17 | Discharge: 2015-12-17 | Disposition: A | Discharge status: Home / Self Care | Payer: 59 | Attending: Emergency Medicine | Admitting: Emergency Medicine

## 2015-12-17 DIAGNOSIS — Z79899 Other long term (current) drug therapy: Secondary | ICD-10-CM | POA: Insufficient documentation

## 2015-12-17 DIAGNOSIS — Z87440 Personal history of urinary (tract) infections: Secondary | ICD-10-CM | POA: Insufficient documentation

## 2015-12-17 DIAGNOSIS — J111 Influenza due to unidentified influenza virus with other respiratory manifestations: Secondary | ICD-10-CM | POA: Insufficient documentation

## 2015-12-17 DIAGNOSIS — Z853 Personal history of malignant neoplasm of breast: Secondary | ICD-10-CM | POA: Insufficient documentation

## 2015-12-17 DIAGNOSIS — G43909 Migraine, unspecified, not intractable, without status migrainosus: Secondary | ICD-10-CM | POA: Insufficient documentation

## 2015-12-17 DIAGNOSIS — R630 Anorexia: Secondary | ICD-10-CM | POA: Insufficient documentation

## 2015-12-17 DIAGNOSIS — R69 Illness, unspecified: Secondary | ICD-10-CM

## 2015-12-17 DIAGNOSIS — I10 Essential (primary) hypertension: Secondary | ICD-10-CM | POA: Insufficient documentation

## 2015-12-17 MED ORDER — FLUTICASONE PROPIONATE 50 MCG/ACT NA SUSP: 2.0000 | Freq: Every day | NASAL | Status: DC

## 2015-12-17 MED ORDER — ALBUTEROL SULFATE HFA 108 (90 BASE) MCG/ACT IN AERS
2.0000 | INHALATION_SPRAY | Freq: Once | RESPIRATORY_TRACT | Status: AC
  Administered 2015-12-17: 2 via RESPIRATORY_TRACT
  Filled 2015-12-17: qty 6.7

## 2015-12-17 MED ORDER — NAPROXEN 500 MG PO TABS: 500.0000 mg | ORAL_TABLET | Freq: Two times a day (BID) | ORAL | Status: DC

## 2015-12-17 MED ORDER — CETIRIZINE HCL 10 MG PO TABS: 10.0000 mg | ORAL_TABLET | Freq: Every day | ORAL | Status: DC

## 2015-12-17 MED ORDER — BENZONATATE 100 MG PO CAPS
100.0000 mg | ORAL_CAPSULE | Freq: Three times a day (TID) | ORAL | Status: DC | PRN
Start: 2015-12-17 — End: 2016-11-12

## 2015-12-17 NOTE — ED Notes (Signed)
URI x 1 week

## 2015-12-17 NOTE — Discharge Instructions (Signed)

## 2015-12-17 NOTE — ED Provider Notes (Signed)
CSN: [id number]     Arrival date & time 12/17/15  1828 History   First MD Initiated Contact with Patient 12/17/15 2128     Chief Complaint  Patient presents with  . URI    [name] is a 45 y.o. female who presents to the emergency department with 5 days of flulike symptoms. She reports low-grade fever on Tuesday with a maximum temperature 100.7 on Tuesday. She reports cough, body aches, sneezing, runny nose, and postnasal drip. She did not receive her flu shot this year. She has been taking Tylenol and ibuprofen today. She reports she's been drinking lots of fluids. She has had some decreased appetite. The patient denies trouble breathing, shortness of breath, abdominal pain, nausea, vomiting, diarrhea, neck pain, neck stiffness, sore throat, changes to her vision, lightheadedness, syncope, or rashes.  Patient is a 45 y.o. female presenting with URI. The history is provided by the patient. No language interpreter was used.  URI Presenting symptoms: cough, fatigue, fever and rhinorrhea   Presenting symptoms: no congestion, no ear pain and no sore throat   Associated symptoms: myalgias and sneezing   Associated symptoms: no neck pain and no wheezing     Past Medical History  Diagnosis Date  . Migraine   . UTI (lower urinary tract infection)   . Hypertension   . Breast cancer [facility])    Past Surgical History  Procedure Laterality Date  . Cholecystectomy    . Rotator cuff repair    . Breast surgery     History reviewed. No pertinent family history. Social History  Substance Use Topics  . Smoking status: Never Smoker   . Smokeless tobacco: Never Used  . Alcohol Use: Yes   OB History    No data available     Review of Systems  Constitutional: Positive for fever, appetite change and fatigue. Negative for chills.  HENT: Positive for postnasal drip, rhinorrhea and sneezing. Negative for congestion, drooling, ear discharge, ear pain, sore throat and trouble swallowing.    Eyes: Negative for visual disturbance.  Respiratory: Positive for cough. Negative for shortness of breath and wheezing.   Cardiovascular: Negative for chest pain.  Gastrointestinal: Negative for nausea, vomiting, abdominal pain and diarrhea.  Genitourinary: Negative for dysuria.  Musculoskeletal: Positive for myalgias. Negative for back pain, neck pain and neck stiffness.  Skin: Negative for rash.  Neurological: Negative for dizziness, syncope and light-headedness.      Allergies  Midazolam hcl  Home Medications   Prior to Admission medications   Medication Sig Start Date End Date Taking? Authorizing Provider  benzonatate (TESSALON) 100 MG capsule Take 1 capsule (100 mg total) by mouth 3 (three) times daily as needed for cough. 12/17/15   Waynetta Pean, PA-C  cetirizine (ZYRTEC ALLERGY) 10 MG tablet Take 1 tablet (10 mg total) by mouth daily. 12/17/15   Waynetta Pean, PA-C  fluticasone (FLONASE) 50 MCG/ACT nasal spray Place 2 sprays into both nostrils daily. 12/17/15   Waynetta Pean, PA-C  naproxen (NAPROSYN) 500 MG tablet Take 1 tablet (500 mg total) by mouth 2 (two) times daily with a meal. 12/17/15   Waynetta Pean, PA-C  topiramate (TOPAMAX) 100 MG tablet Take 100 mg by mouth 2 (two) times daily.    Historical Provider, MD   BP 160/109 mmHg  Pulse 102  Temp(Src) 98.7 F (37.1 C) (Oral)  Resp 20  Ht 5\' 1"  (1.549 m)  Wt 67.586 kg  BMI 28.17 kg/m2  SpO2 98%  LMP 12/17/2015 Physical  Exam  Constitutional: She is oriented to person, place, and time. She appears well-developed and well-nourished. No distress.  Nontoxic appearing.  HENT:  Head: Normocephalic and atraumatic.  Right Ear: External ear normal.  Left Ear: External ear normal.  Mouth/Throat: Oropharynx is clear and moist. No oropharyngeal exudate.  No tonsillar hypertrophy or exudates. Boggy nasal turbinates bilaterally. Rhinorrhea. Bilateral tympanic membranes are pearly-gray without erythema or loss of landmarks.    Eyes: Conjunctivae are normal. Pupils are equal, round, and reactive to light. Right eye exhibits no discharge. Left eye exhibits no discharge.  Neck: Normal range of motion. Neck supple. No JVD present. No tracheal deviation present.  Cardiovascular: Normal rate, regular rhythm, normal heart sounds and intact distal pulses.  Exam reveals no gallop and no friction rub.   No murmur heard. Heart rate is 88.  Pulmonary/Chest: Effort normal and breath sounds normal. No respiratory distress. She has no wheezes. She has no rales.  Lungs are clear to auscultation bilaterally.  Abdominal: Soft. There is no tenderness. There is no guarding.  Musculoskeletal: She exhibits no edema.  Lymphadenopathy:    She has no cervical adenopathy.  Neurological: She is alert and oriented to person, place, and time. Coordination normal.  Skin: Skin is warm and dry. No rash noted. She is not diaphoretic. No erythema. No pallor.  Psychiatric: She has a normal mood and affect. Her behavior is normal.  Nursing note and vitals reviewed.   ED Course  Procedures (including critical care time) Labs Review Labs Reviewed - No data to display  Imaging Review Dg Chest 2 View  12/17/2015  CLINICAL DATA:  Productive cough and fever for 1 week. Personal history of breast carcinoma. EXAM: CHEST  2 VIEW COMPARISON:  12/21/2014 FINDINGS: The heart size and mediastinal contours are within normal limits. Both lungs are clear. The visualized skeletal structures are unremarkable. IMPRESSION: No active cardiopulmonary disease. Electronically Signed   By: Earle Gell M.D.   On: 12/17/2015 19:51   I have personally reviewed and evaluated these images as part of my medical decision-making.   EKG Interpretation None      Filed Vitals:   12/17/15 1845  BP: 160/109  Pulse: 102  Temp: 98.7 F (37.1 C)  TempSrc: Oral  Resp: 20  Height: 5\' 1"  (1.549 m)  Weight: 67.586 kg  SpO2: 98%     MDM   Meds given in  ED:  Medications  albuterol (PROVENTIL HFA;VENTOLIN HFA) 108 (90 Base) MCG/ACT inhaler 2 puff (2 puffs Inhalation Given 12/17/15 2158)    New Prescriptions   BENZONATATE (TESSALON) 100 MG CAPSULE    Take 1 capsule (100 mg total) by mouth 3 (three) times daily as needed for cough.   CETIRIZINE (ZYRTEC ALLERGY) 10 MG TABLET    Take 1 tablet (10 mg total) by mouth daily.   FLUTICASONE (FLONASE) 50 MCG/ACT NASAL SPRAY    Place 2 sprays into both nostrils daily.   NAPROXEN (NAPROSYN) 500 MG TABLET    Take 1 tablet (500 mg total) by mouth 2 (two) times daily with a meal.    Final diagnoses:  Influenza-like illness   This is a 45 y.o. female who presents to the emergency department with 5 days of flulike symptoms. She reports low-grade fever on Tuesday with a maximum temperature 100.7 on Tuesday. She reports cough, body aches, sneezing, runny nose, and postnasal drip. She did not receive her flu shot this year. On exam the patient is afebrile nontoxic appearing. Her lungs are  clear to auscultation bilaterally. She has rhinorrhea and boggy nasal turbinates bilaterally. She does not appear dehydrated. Abdomen is soft and nontender to palpation. Chest x-ray shows no pneumonia. Patient with influenza-like illness. Will discharge with prescriptions for Tessalon Perles, Zyrtec, Flonase and naproxen. I encouraged her to drink plenty of fluids. I encouraged her to follow-up closely with her primary care provider. I discussed strict and specific return precautions. I advised the patient to follow-up with their primary care provider this week. I advised the patient to return to the emergency department with new or worsening symptoms or new concerns. The patient verbalized understanding and agreement with plan.      Waynetta Pean, PA-C 12/17/15 [place], MD 12/18/15 [phone]

## 2016-01-07 ENCOUNTER — Emergency Department (HOSPITAL_BASED_OUTPATIENT_CLINIC_OR_DEPARTMENT_OTHER)
Admission: EM | Admit: 2016-01-07 | Discharge: 2016-01-07 | Discharge status: Against Medical Advice | Payer: Managed Care, Other (non HMO)

## 2016-01-07 NOTE — ED Notes (Signed)
Pt expresses unhappiness with approximate wait times -- states she plans to seek treatment at a different time; pt able to ambulate independently; NAD noted at this time.

## 2016-01-08 ENCOUNTER — Emergency Department (HOSPITAL_BASED_OUTPATIENT_CLINIC_OR_DEPARTMENT_OTHER): Payer: Managed Care, Other (non HMO)

## 2016-01-08 ENCOUNTER — Emergency Department (HOSPITAL_BASED_OUTPATIENT_CLINIC_OR_DEPARTMENT_OTHER)
Admission: EM | Admit: 2016-01-08 | Discharge: 2016-01-09 | Disposition: A | Discharge status: Home / Self Care | Payer: Managed Care, Other (non HMO) | Attending: Emergency Medicine | Admitting: Emergency Medicine

## 2016-01-08 ENCOUNTER — Encounter (HOSPITAL_BASED_OUTPATIENT_CLINIC_OR_DEPARTMENT_OTHER): Payer: Self-pay | Admitting: Emergency Medicine

## 2016-01-08 DIAGNOSIS — Y9389 Activity, other specified: Secondary | ICD-10-CM | POA: Insufficient documentation

## 2016-01-08 DIAGNOSIS — Z79899 Other long term (current) drug therapy: Secondary | ICD-10-CM | POA: Insufficient documentation

## 2016-01-08 DIAGNOSIS — Y998 Other external cause status: Secondary | ICD-10-CM | POA: Insufficient documentation

## 2016-01-08 DIAGNOSIS — Z791 Long term (current) use of non-steroidal anti-inflammatories (NSAID): Secondary | ICD-10-CM | POA: Diagnosis not present

## 2016-01-08 DIAGNOSIS — S99911A Unspecified injury of right ankle, initial encounter: Secondary | ICD-10-CM | POA: Insufficient documentation

## 2016-01-08 DIAGNOSIS — Z87440 Personal history of urinary (tract) infections: Secondary | ICD-10-CM | POA: Insufficient documentation

## 2016-01-08 DIAGNOSIS — Z853 Personal history of malignant neoplasm of breast: Secondary | ICD-10-CM | POA: Diagnosis not present

## 2016-01-08 DIAGNOSIS — Z7951 Long term (current) use of inhaled steroids: Secondary | ICD-10-CM | POA: Insufficient documentation

## 2016-01-08 DIAGNOSIS — W010XXA Fall on same level from slipping, tripping and stumbling without subsequent striking against object, initial encounter: Secondary | ICD-10-CM | POA: Insufficient documentation

## 2016-01-08 DIAGNOSIS — I10 Essential (primary) hypertension: Secondary | ICD-10-CM | POA: Diagnosis not present

## 2016-01-08 DIAGNOSIS — M25571 Pain in right ankle and joints of right foot: Secondary | ICD-10-CM

## 2016-01-08 DIAGNOSIS — Y9289 Other specified places as the place of occurrence of the external cause: Secondary | ICD-10-CM | POA: Insufficient documentation

## 2016-01-08 NOTE — ED Notes (Signed)
Patient states that she had had pain to her right ankle since she fell 5 -6 days ago

## 2016-01-09 NOTE — ED Provider Notes (Signed)
CSN: [id number]     Arrival date & time 01/08/16  2328 History   First MD Initiated Contact with Patient 01/09/16 0003     Chief Complaint  Patient presents with  . Ankle Pain     (Consider location/radiation/quality/duration/timing/severity/associated sxs/prior Treatment) Patient is a 45 y.o. female presenting with ankle pain. The history is provided by the patient and medical records. No language interpreter was used.  Ankle Pain  [name] is a 45 y.o. female  with a PMH of migraines, HTN who presents to the Emergency Department complaining of persistent aching non-radiating right lateral ankle pain x 5 days. Patient states she slipped on a small ball lying on the floor and rolled her ankle approximately 5-6 days ago. She has tried ibuprofen with minimal relief, along with rest, ice, elevation. Patient denies swelling, color change, fever.  Past Medical History  Diagnosis Date  . Migraine   . UTI (lower urinary tract infection)   . Hypertension   . Breast cancer [facility])    Past Surgical History  Procedure Laterality Date  . Cholecystectomy    . Rotator cuff repair    . Breast surgery     History reviewed. No pertinent family history. Social History  Substance Use Topics  . Smoking status: Never Smoker   . Smokeless tobacco: Never Used  . Alcohol Use: Yes   OB History    No data available     Review of Systems  Musculoskeletal: Positive for arthralgias. Negative for joint swelling.  Skin: Negative for color change.      Allergies  Midazolam hcl  Home Medications   Prior to Admission medications   Medication Sig Start Date End Date Taking? Authorizing Provider  benzonatate (TESSALON) 100 MG capsule Take 1 capsule (100 mg total) by mouth 3 (three) times daily as needed for cough. 12/17/15   Waynetta Pean, PA-C  cetirizine (ZYRTEC ALLERGY) 10 MG tablet Take 1 tablet (10 mg total) by mouth daily. 12/17/15   Waynetta Pean, PA-C  fluticasone (FLONASE) 50  MCG/ACT nasal spray Place 2 sprays into both nostrils daily. 12/17/15   Waynetta Pean, PA-C  naproxen (NAPROSYN) 500 MG tablet Take 1 tablet (500 mg total) by mouth 2 (two) times daily with a meal. 12/17/15   Waynetta Pean, PA-C  topiramate (TOPAMAX) 100 MG tablet Take 100 mg by mouth 2 (two) times daily.    Historical Provider, MD   BP 142/97 mmHg  Pulse 89  Temp(Src) 98.3 F (36.8 C) (Oral)  Resp 18  Ht 5\' 1"  (1.549 m)  Wt 63.504 kg  BMI 26.47 kg/m2  SpO2 98%  LMP 12/17/2015 Physical Exam  Constitutional: She is oriented to person, place, and time. She appears well-developed and well-nourished.  Alert and in no acute distress  HENT:  Head: Normocephalic and atraumatic.  Cardiovascular: Normal rate, regular rhythm, normal heart sounds and intact distal pulses.  Exam reveals no gallop and no friction rub.   No murmur heard. Pulmonary/Chest: Effort normal and breath sounds normal. No respiratory distress. She has no wheezes. She has no rales. She exhibits no tenderness.  Musculoskeletal:       Feet:  Ankle with full ROM. No erythema, ecchymosis, or deformity appreciated. TTP of lateral malleolus but no TTP or swelling of fore foot or calf. No break in skin. No warmth. Achilles intact. Good pedal pulse and cap refill of all toes. Wiggling toes without difficulty. Sensation grossly intact.  Neurological: She is alert and oriented to person, place,  and time.  Skin: Skin is warm and dry. No erythema.  Psychiatric: She has a normal mood and affect. Her behavior is normal. Judgment and thought content normal.  Nursing note and vitals reviewed.   ED Course  Procedures (including critical care time) Labs Review Labs Reviewed - No data to display  Imaging Review Dg Ankle Complete Right  01/08/2016  CLINICAL DATA:  Pain after trip and fall 5 days ago. EXAM: RIGHT ANKLE - COMPLETE 3+ VIEW COMPARISON:  None. FINDINGS: There is no evidence of fracture, dislocation, or joint effusion. There  is no evidence of arthropathy or other focal bone abnormality. Soft tissues are unremarkable. IMPRESSION: Negative. Electronically Signed   By: Andreas Newport M.D.   On: 01/08/2016 23:57   I have personally reviewed and evaluated these images and lab results as part of my medical decision-making.   EKG Interpretation None      MDM   Final diagnoses:  Ankle pain, right   [name] presents with persistent right ankle pain after rolling ankle 5-6 days ago. On exam, tenderness to palpation of lateral malleolus, but no swelling, ecchymosis, or erythema. No concern for septic joint. X-rays were obtained which were unremarkable. Ankle brace given in ED. Patient states she has crutches and cane at home. Weightbearing as tolerated. Symptomatic home care / RICE was discussed. Ortho follow-up if no improvement in the next 2-3 days. Return precautions were given and all questions were answered.  [facility] Kensleigh Gates, PA-C 01/09/16 0102  Shanon Rosser, MD 01/09/16 [phone]

## 2016-01-09 NOTE — Discharge Instructions (Signed)
Be sure to read and understand instructions below prior to leaving the hospital. If your symptoms persist without any improvement in 1 week it is recommended that you follow up with the orthopedist listed above (or your orthopedic physician).   Ankle Sprain  An ankle sprain is an injury to the ligaments that hold the ankle joint together. Your X-ray today showed no evidence of fracture, however keep all follow-up appointments with an orthopedic specialist to have follow-up X-rays, because fractures may not appear until 3 days after the acute injury.    TREATMENT  Rest, ice, elevation, and compression are the basic modes of treatment.    HOME CARE INSTRUCTIONS  Apply ice to the sore area for 15 to 20 minutes, 3 to 4 times per day. Do this while you are awake for the first 2 days. This can be stopped when the swelling goes away. Put the ice in a plastic bag and place a towel between the bag of ice and your skin.  Keep your leg elevated when possible to lessen swelling.  If your caregiver recommends crutches, use them as instructed for 1 week. Then, you may walk on your ankle weight bearing as tolerated.  You may take off your ankle stabilizer at night and to take a shower or bath. Wiggle your toes in the splint several times per day if you are able.  Do not drive a vehicle on pain medication. ACTIVITY:            - Weight bearing as tolerated            - Exercises should be limited to pain free range of motion            - Can start mobilization by tracing the alphabet with your foot in the air.       SEEK MEDICAL CARE IF:  You have an increase in bruising, swelling, or pain.  Your toes feel cold.  Pain relief is not achieved with medications.  EMERGENCY:: Your toes are numb or blue or you have severe pain.   MAKE SURE YOU:  Understand these instructions.  Will watch your condition.  Will get help right away if you are not doing well or get worse   COLD THERAPY DIRECTIONS:  Ice or gel  packs can be used to reduce both pain and swelling. Ice is the most helpful within the first 24 to 48 hours after an injury or flareup from overusing a muscle or joint.  Ice is effective, has very few side effects, and is safe for most people to use.   If you expose your skin to cold temperatures for too long or without the proper protection, you can damage your skin or nerves. Watch for signs of skin damage due to cold.   HOME CARE INSTRUCTIONS  Follow these tips to use ice and cold packs safely.  Place a dry or damp towel between the ice and skin. A damp towel will cool the skin more quickly, so you may need to shorten the time that the ice is used.  For a more rapid response, add gentle compression to the ice.  Ice for no more than 10 to 20 minutes at a time. The bonier the area you are icing, the less time it will take to get the benefits of ice.  Check your skin after 5 minutes to make sure there are no signs of a poor response to cold or skin damage.  Rest 20 minutes or  more in between uses.  Once your skin is numb, you can end your treatment. You can test numbness by very lightly touching your skin. The touch should be so light that you do not see the skin dimple from the pressure of your fingertip. When using ice, most people will feel these normal sensations in this order: cold, burning, aching, and numbness.

## 2016-08-13 ENCOUNTER — Encounter (HOSPITAL_BASED_OUTPATIENT_CLINIC_OR_DEPARTMENT_OTHER): Payer: Self-pay | Admitting: *Deleted

## 2016-08-13 ENCOUNTER — Emergency Department (HOSPITAL_BASED_OUTPATIENT_CLINIC_OR_DEPARTMENT_OTHER)
Admission: EM | Admit: 2016-08-13 | Discharge: 2016-08-13 | Disposition: A | Discharge status: Home / Self Care | Payer: 59 | Attending: Emergency Medicine | Admitting: Emergency Medicine

## 2016-08-13 ENCOUNTER — Emergency Department (HOSPITAL_BASED_OUTPATIENT_CLINIC_OR_DEPARTMENT_OTHER): Payer: 59

## 2016-08-13 DIAGNOSIS — M778 Other enthesopathies, not elsewhere classified: Secondary | ICD-10-CM | POA: Insufficient documentation

## 2016-08-13 DIAGNOSIS — M25531 Pain in right wrist: Secondary | ICD-10-CM

## 2016-08-13 DIAGNOSIS — Z79899 Other long term (current) drug therapy: Secondary | ICD-10-CM | POA: Insufficient documentation

## 2016-08-13 DIAGNOSIS — X58XXXA Exposure to other specified factors, initial encounter: Secondary | ICD-10-CM

## 2016-08-13 DIAGNOSIS — Y9389 Activity, other specified: Secondary | ICD-10-CM | POA: Insufficient documentation

## 2016-08-13 DIAGNOSIS — Y92009 Unspecified place in unspecified non-institutional (private) residence as the place of occurrence of the external cause: Secondary | ICD-10-CM | POA: Insufficient documentation

## 2016-08-13 DIAGNOSIS — Y990 Civilian activity done for income or pay: Secondary | ICD-10-CM | POA: Insufficient documentation

## 2016-08-13 DIAGNOSIS — Z853 Personal history of malignant neoplasm of breast: Secondary | ICD-10-CM | POA: Insufficient documentation

## 2016-08-13 DIAGNOSIS — I10 Essential (primary) hypertension: Secondary | ICD-10-CM | POA: Insufficient documentation

## 2016-08-13 DIAGNOSIS — X503XXA Overexertion from repetitive movements, initial encounter: Secondary | ICD-10-CM | POA: Insufficient documentation

## 2016-08-13 MED ORDER — NAPROXEN 500 MG PO TABS: 500.0000 mg | ORAL_TABLET | Freq: Two times a day (BID) | ORAL | 0 refills | Status: DC

## 2016-08-13 NOTE — ED Provider Notes (Signed)
[facility]PT [facility] Provider Note   CSN: [id number] Arrival date & time: 08/13/16  1354     History   Chief Complaint Chief Complaint  Patient presents with  . Wrist Pain    HPI [name] is a 45 y.o. female with a PMHx of HTN, migraines, and remote breast CA, who presents to the ED with complaints of right wrist pain that began this morning after she awoke. Patient states the pain is 6/10 constant sharp nonradiating right wrist pain worse with movement and unrelieved with Motrin and ice. Associated symptoms includes waxing and waning swelling. 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 She denies any erythema, warmth, bruising, numbness, tingling, focal weakness, fevers, chills, chest pain, shortness breath, abdominal pain, nausea, vomiting, diarrhea, constipation, dysuria, hematuria, or any other symptoms.   The history is provided by the patient and medical records. No language interpreter was used.  Wrist Pain  This is a new problem. The current episode started 6 to 12 hours ago. The problem occurs constantly. The problem has not changed since onset.Pertinent negatives include no chest pain, no abdominal pain and no shortness of breath. Exacerbated by: wrist movement. The symptoms are relieved by ice and NSAIDs. She has tried a cold compress (and motrin) for the symptoms. The treatment provided no relief.    Past Medical History:  Diagnosis Date  . Breast cancer ([place])   . Hypertension   . Migraine   . UTI (lower urinary tract infection)     Patient Active Problem List   Diagnosis Date Noted  . Migraine 12/02/2013    Past Surgical History:  Procedure Laterality Date  . BREAST SURGERY    . CHOLECYSTECTOMY    . ROTATOR CUFF REPAIR      OB History    No data available       Home Medications    Prior to Admission medications   Medication Sig Start  Date End Date Taking? Authorizing Provider  benzonatate (TESSALON) 100 MG capsule Take 1 capsule (100 mg total) by mouth 3 (three) times daily as needed for cough. 12/17/15   Waynetta Pean, PA-C  cetirizine (ZYRTEC ALLERGY) 10 MG tablet Take 1 tablet (10 mg total) by mouth daily. 12/17/15   Waynetta Pean, PA-C  fluticasone (FLONASE) 50 MCG/ACT nasal spray Place 2 sprays into both nostrils daily. 12/17/15   Waynetta Pean, PA-C  naproxen (NAPROSYN) 500 MG tablet Take 1 tablet (500 mg total) by mouth 2 (two) times daily with a meal. 12/17/15   Waynetta Pean, PA-C  topiramate (TOPAMAX) 100 MG tablet Take 100 mg by mouth 2 (two) times daily.    Historical Provider, MD    Family History No family history on file.  Social History Social History  Substance Use Topics  . Smoking status: Never Smoker  . Smokeless tobacco: Never Used  . Alcohol use Yes     Allergies   Midazolam hcl   Review of Systems Review of Systems  Constitutional: Negative for chills and fever.  Respiratory: Negative for shortness of breath.   Cardiovascular: Negative for chest pain.  Gastrointestinal: Negative for abdominal pain, constipation, diarrhea, nausea and vomiting.  Genitourinary: Negative for dysuria and hematuria.  Musculoskeletal: Positive for arthralgias (R wrist) and joint swelling (R wrist). Negative for myalgias.  Skin: Negative for color change.  Allergic/Immunologic: Negative for immunocompromised state.  Neurological: Negative for weakness and numbness.  Psychiatric/Behavioral:  Negative for confusion.   10 Systems reviewed and are negative for acute change except as noted in the HPI.   Physical Exam Updated Vital Signs BP 144/88 (BP Location: Left Arm)   Pulse 79   Temp 98.9 F (37.2 C) (Oral)   Resp 16   Ht 5\' 1"  (1.549 m)   Wt 59 kg   LMP 08/06/2016   SpO2 100%   BMI 24.56 kg/m   Physical Exam  Constitutional: She is oriented to person, place, and time. Vital signs are normal. She  appears well-developed and well-nourished.  Non-toxic appearance. No distress.  Afebrile, nontoxic, NAD  HENT:  Head: Normocephalic and atraumatic.  Mouth/Throat: Mucous membranes are normal.  Eyes: Conjunctivae and EOM are normal. Right eye exhibits no discharge. Left eye exhibits no discharge.  Neck: Normal range of motion. Neck supple.  Cardiovascular: Normal rate and intact distal pulses.   Pulmonary/Chest: Effort normal. No respiratory distress.  Abdominal: Normal appearance. She exhibits no distension.  Musculoskeletal: Normal range of motion.       Right wrist: She exhibits tenderness and bony tenderness. She exhibits normal range of motion, no swelling, no crepitus, no deformity and no laceration.  R wrist with FROM intact, with mild TTP to dorsal aspect of wrist, no volar aspect TTP, no anatomical snuffbox TTP, no swelling or deformity, no crepitus, no bruising/erythema/warmth, strength and sensation grossly intact, distal pulses intact, soft compartments. Neg Tinel's and phalen's.  Neurological: She is alert and oriented to person, place, and time. She has normal strength. No sensory deficit.  Skin: Skin is warm, dry and intact. No rash noted.  Psychiatric: She has a normal mood and affect. Her behavior is normal.  Nursing note and vitals reviewed.    ED Treatments / Results  Labs (all labs ordered are listed, but only abnormal results are displayed) Labs Reviewed - No data to display  EKG  EKG Interpretation None       Radiology Dg Wrist Complete Right  Result Date: 08/13/2016 CLINICAL DATA:  Wrist pain.  No injury. EXAM: RIGHT WRIST - COMPLETE 3+ VIEW COMPARISON:  07/06/2014. FINDINGS: No acute bony or joint abnormality identified. No evidence of fracture or dislocation. IMPRESSION: No acute abnormality. Electronically Signed   By: Marcello Moores  Register   On: 08/13/2016 14:20    Procedures Procedures (including critical care time)  Medications Ordered in  ED Medications - No data to display   Initial Impression / Assessment and Plan / ED Course  I have reviewed the triage vital signs and the nursing notes.  Pertinent labs & imaging results that were available during my care of the patient were reviewed by me and considered in my medical decision making (see chart for details).  Clinical Course    45 y.o. female here with R wrist pain that began this morning, no injury. Does housework, but can't recall any specific repetitive movements she did recently. States it was swollen, but no swelling noted on exam; no erythema/warmth, no bruising, mild TTP to dorsal aspect of wrist, NVI with soft compartments, FROM intact. Xray obtained prior to exam, which was neg. Discussed that it's likely mild tendinitis from overuse strain, will apply velcro splint for comfort, discussed RICE, and NSAID rx given. F/up with sports med in 1-2wks for recheck and ongoing management of symptoms. I explained the diagnosis and have given explicit precautions to return to the ER including for any other new or worsening symptoms. The patient understands and accepts the medical plan as  it's been dictated and I have answered their questions. Discharge instructions concerning home care and prescriptions have been given. The patient is STABLE and is discharged to home in good condition.   Final Clinical Impressions(s) / ED Diagnoses   Final diagnoses:  Right wrist pain  Tendinitis of right wrist  Cumulative trauma from repetitive motion, initial encounter    New Prescriptions New Prescriptions   NAPROXEN (NAPROSYN) 500 MG TABLET    Take 1 tablet (500 mg total) by mouth 2 (two) times daily with a meal.     Naquisha Whitehair Camprubi-Soms, PA-C 08/13/16 [place], MD 08/13/16 2243

## 2016-08-13 NOTE — Discharge Instructions (Signed)
Wear wrist brace for at least 1 weeks for rest of wrist. Ice and elevate wrist throughout the day, using ice pack for no more than 20 minutes every hour.  Use naprosyn as directed, and use tylenol for additional pain relief. Follow up with the sports medicine clinic in 1-2 weeks for recheck and ongoing management of your wrist pain. Return to the ER for changes or worsening symptoms.

## 2016-08-13 NOTE — ED Triage Notes (Signed)
She woke with pain in her right wrist. No injury.

## 2016-08-26 IMAGING — DX DG ANKLE COMPLETE 3+V*R*
3 series · 3 of 3 positions shown · non-contrast
Comparison: None.

CLINICAL DATA: Pain after trip and fall 5 days ago.

EXAM:
RIGHT ANKLE - COMPLETE 3+ VIEW

[ankle ap]
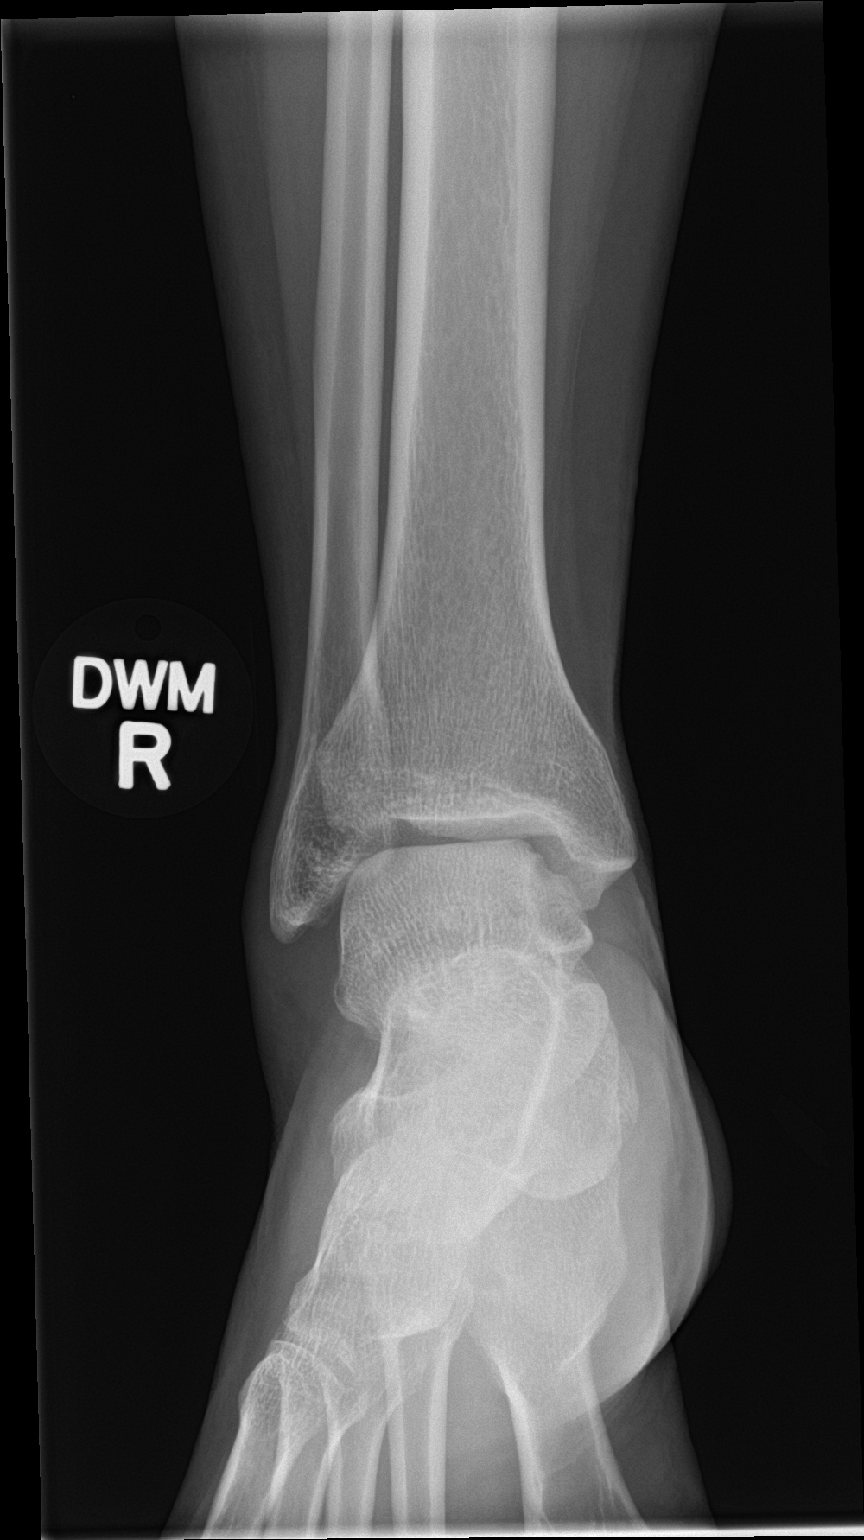

[ankle obl]
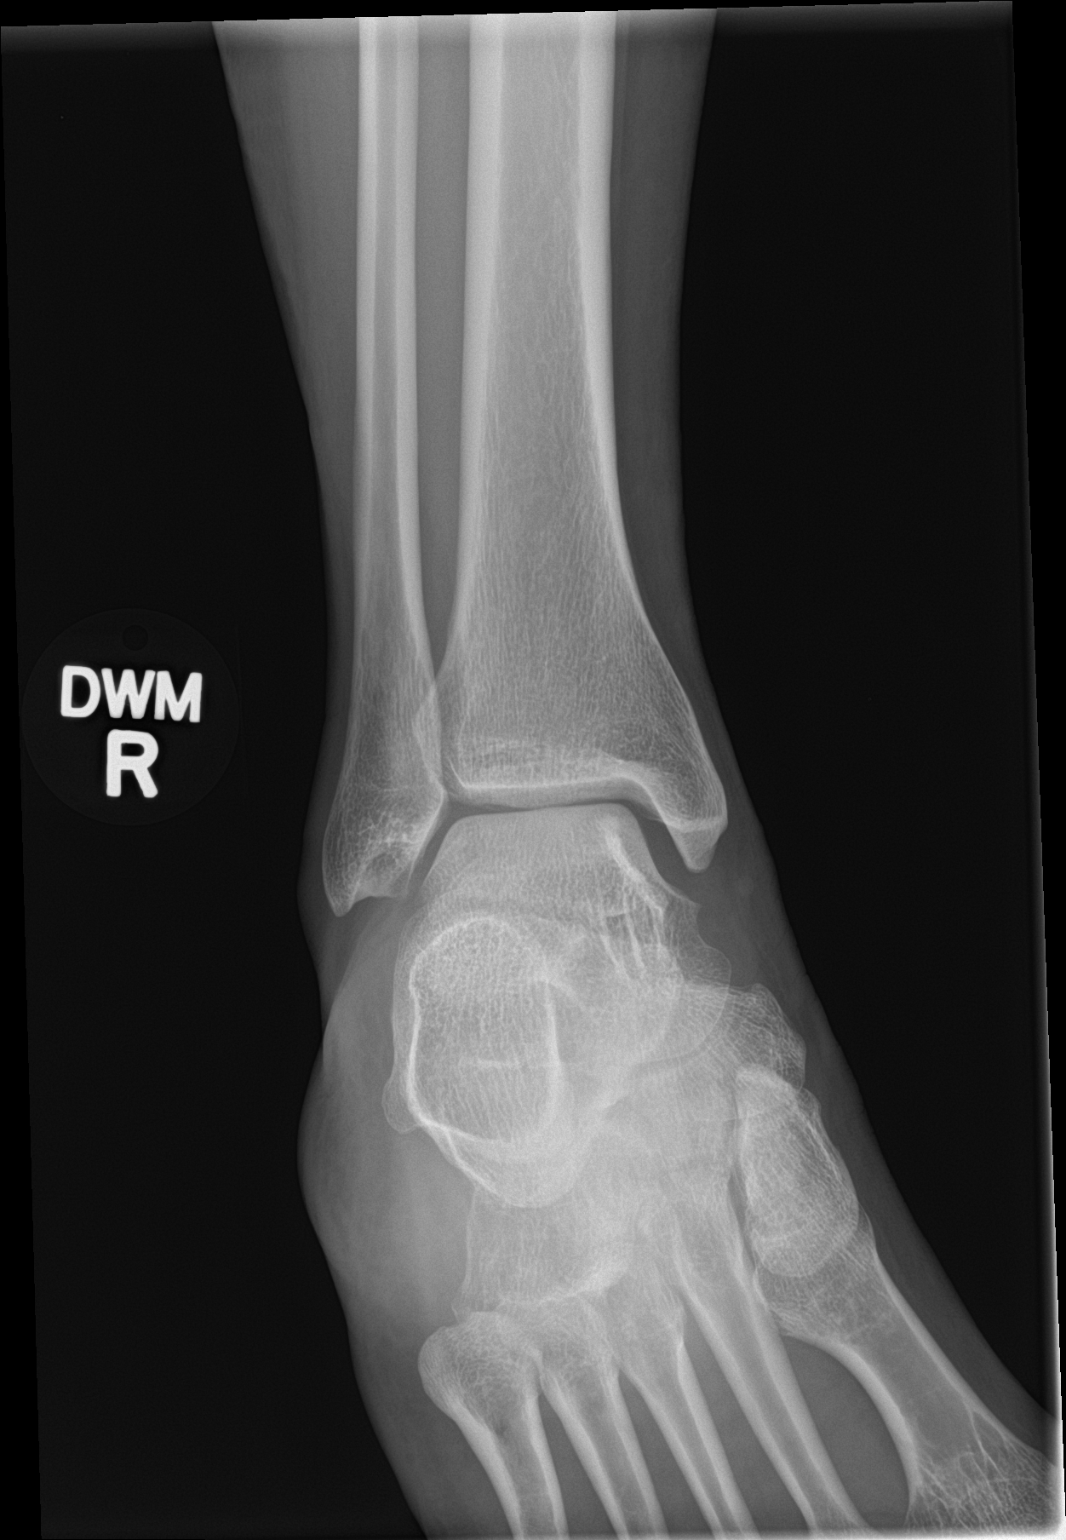

[ankle lat]
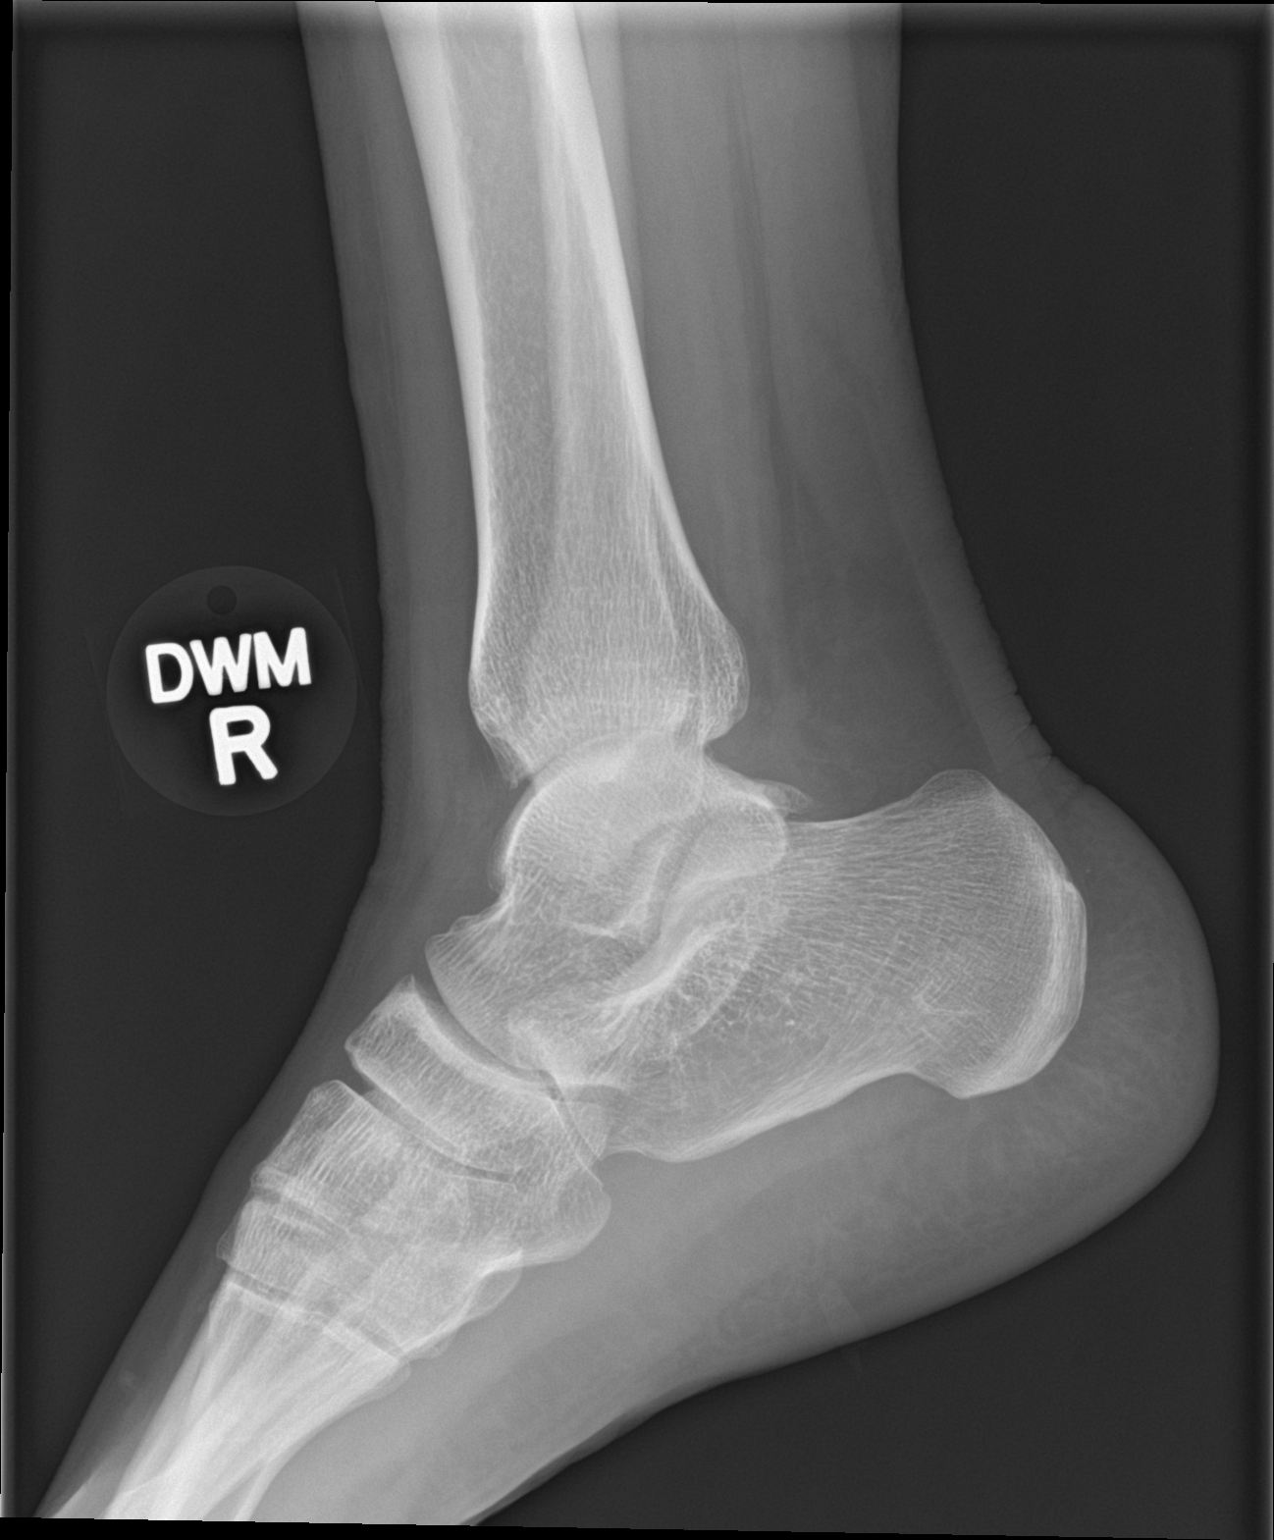

[3 of 3 positions shown; findings below may reference images not displayed]

FINDINGS: There is no evidence of fracture, dislocation, or joint effusion.
There is no evidence of arthropathy or other focal bone abnormality.
Soft tissues are unremarkable.
IMPRESSION: Negative.

## 2016-11-12 ENCOUNTER — Emergency Department (HOSPITAL_BASED_OUTPATIENT_CLINIC_OR_DEPARTMENT_OTHER)
Admission: EM | Admit: 2016-11-12 | Discharge: 2016-11-12 | Disposition: A | Discharge status: Home / Self Care | Payer: Managed Care, Other (non HMO) | Attending: Emergency Medicine | Admitting: Emergency Medicine

## 2016-11-12 ENCOUNTER — Emergency Department (HOSPITAL_BASED_OUTPATIENT_CLINIC_OR_DEPARTMENT_OTHER): Payer: Managed Care, Other (non HMO)

## 2016-11-12 ENCOUNTER — Encounter (HOSPITAL_BASED_OUTPATIENT_CLINIC_OR_DEPARTMENT_OTHER): Payer: Self-pay

## 2016-11-12 DIAGNOSIS — I10 Essential (primary) hypertension: Secondary | ICD-10-CM | POA: Insufficient documentation

## 2016-11-12 DIAGNOSIS — J3489 Other specified disorders of nose and nasal sinuses: Secondary | ICD-10-CM | POA: Insufficient documentation

## 2016-11-12 DIAGNOSIS — R0789 Other chest pain: Secondary | ICD-10-CM | POA: Insufficient documentation

## 2016-11-12 DIAGNOSIS — Z853 Personal history of malignant neoplasm of breast: Secondary | ICD-10-CM | POA: Insufficient documentation

## 2016-11-12 DIAGNOSIS — R509 Fever, unspecified: Secondary | ICD-10-CM | POA: Insufficient documentation

## 2016-11-12 DIAGNOSIS — J029 Acute pharyngitis, unspecified: Secondary | ICD-10-CM | POA: Insufficient documentation

## 2016-11-12 DIAGNOSIS — R51 Headache: Secondary | ICD-10-CM | POA: Insufficient documentation

## 2016-11-12 DIAGNOSIS — R0981 Nasal congestion: Secondary | ICD-10-CM | POA: Insufficient documentation

## 2016-11-12 DIAGNOSIS — R6889 Other general symptoms and signs: Secondary | ICD-10-CM

## 2016-11-12 MED ORDER — IBUPROFEN 800 MG PO TABS
800.0000 mg | ORAL_TABLET | Freq: Once | ORAL | Status: AC
  Administered 2016-11-12: 800 mg via ORAL
  Filled 2016-11-12: qty 1

## 2016-11-12 MED ORDER — KETOROLAC TROMETHAMINE 30 MG/ML IJ SOLN
30.0000 mg | Freq: Once | INTRAMUSCULAR | Status: AC
  Administered 2016-11-12: 30 mg via INTRAMUSCULAR
  Filled 2016-11-12: qty 1

## 2016-11-12 NOTE — ED Notes (Signed)
Patient transported to X-ray 

## 2016-11-12 NOTE — ED Notes (Signed)
ED Provider at bedside. 

## 2016-11-12 NOTE — ED Provider Notes (Signed)
[facility] Provider Note   CSN: [id number] Arrival date & time: 11/12/16  1825   By signing my name below, I, Soijett Blue, attest that this documentation has been prepared under the direction and in the presence of Shary Decamp, PA-C Electronically Signed: Soijett Blue, ED Scribe. 11/12/16. 8:54 PM.  History   Chief Complaint Chief Complaint  Patient presents with  . Cough    HPI [name] is a 46 y.o. female with a PMHx of HTN, breast CA, who presents to the Emergency Department complaining of cough onset 2 days ago. She is having associated symptoms of generalized body aches, chest wall tenderness due to cough, HA, nasal congestion, rhinorrhea, fever, and sore throat. She has tried nyquil and robutiussin with no relief for her symptoms. She denies chills, vomiting, diarrhea, appetite change, and any other symptoms. Denies sick contacts. Denies medical issues with her kidney or liver.  The history is provided by the patient. No language interpreter was used.    Past Medical History:  Diagnosis Date  . Breast cancer ([place])   . Hypertension   . Migraine   . UTI (lower urinary tract infection)     Patient Active Problem List   Diagnosis Date Noted  . Migraine 12/02/2013    Past Surgical History:  Procedure Laterality Date  . BREAST SURGERY    . CHOLECYSTECTOMY    . ROTATOR CUFF REPAIR      OB History    No data available       Home Medications    Prior to Admission medications   Not on File    Family History No family history on file.  Social History Social History  Substance Use Topics  . Smoking status: Never Smoker  . Smokeless tobacco: Never Used  . Alcohol use No     Allergies   Midazolam hcl   Review of Systems Review of Systems  Constitutional: Positive for fever. Negative for appetite change and chills.  HENT: Positive for congestion, rhinorrhea and sore throat.   Respiratory: Positive for cough.        +chest wall  tenderness due to cough  Gastrointestinal: Negative for diarrhea and vomiting.  Musculoskeletal: Positive for myalgias.  Neurological: Positive for headaches.   A complete 10 system review of systems was obtained and all systems are negative except as noted in the HPI and PMH.   Physical Exam Updated Vital Signs BP (!) 158/105 (BP Location: Left Arm)   Pulse 105   Temp 102.2 F (39 C) (Oral)   Resp 20   Ht 5\' 2"  (1.575 m)   Wt 137 lb (62.1 kg)   LMP 10/29/2016   SpO2 100%   BMI 25.06 kg/m   Physical Exam  Constitutional: She is oriented to person, place, and time. She appears well-developed and well-nourished. No distress.  HENT:  Head: Normocephalic and atraumatic.  Right Ear: Tympanic membrane, external ear and ear canal normal.  Left Ear: Tympanic membrane, external ear and ear canal normal.  Nose: Nose normal.  Mouth/Throat: Uvula is midline, oropharynx is clear and moist and mucous membranes are normal. No trismus in the jaw. No oropharyngeal exudate, posterior oropharyngeal erythema or tonsillar abscesses.  Eyes: EOM are normal. Pupils are equal, round, and reactive to light.  Neck: Normal range of motion. Neck supple. No tracheal deviation present.  Cardiovascular: Normal rate, regular rhythm, S1 normal, S2 normal, normal heart sounds, intact distal pulses and normal pulses.  Exam reveals no gallop and  no friction rub.   No murmur heard. Pulmonary/Chest: Effort normal and breath sounds normal. No respiratory distress. She has no decreased breath sounds. She has no wheezes. She has no rhonchi. She has no rales.  Abdominal: Normal appearance and bowel sounds are normal. She exhibits no distension. There is no tenderness.  Musculoskeletal: Normal range of motion.  Neurological: She is alert and oriented to person, place, and time.  Skin: Skin is warm and dry.  Psychiatric: She has a normal mood and affect. Her speech is normal and behavior is normal. Thought content normal.    Nursing note and vitals reviewed.    ED Treatments / Results  DIAGNOSTIC STUDIES: Oxygen Saturation is 100% on RA, nl by my interpretation.    COORDINATION OF CARE: 8:49 PM Discussed treatment plan with pt at bedside which includes CXR, ibuprofen, toradol injection, and pt agreed to plan.   Radiology Dg Chest 2 View  Result Date: 11/12/2016 CLINICAL DATA:  Productive cough with chest pressure and fever. EXAM: CHEST  2 VIEW COMPARISON:  None. FINDINGS: The lungs are clear wiithout focal pneumonia, edema, pneumothorax or pleural effusion. Nodular density/densities projecting over the lungs are compatible with pads for telemetry leads. The cardiopericardial silhouette is within normal limits for size. The visualized bony structures of the thorax are intact. IMPRESSION: No active cardiopulmonary disease. Electronically Signed   By: Misty Stanley M.D.   On: 11/12/2016 19:09    Procedures Procedures (including critical care time)  Medications Ordered in ED Medications  ibuprofen (ADVIL,MOTRIN) tablet 800 mg (800 mg Oral Given 11/12/16 1849)     Initial Impression / Assessment and Plan / ED Course  I have reviewed the triage vital signs and the nursing notes.  Pertinent imaging results that were available during my care of the patient were reviewed by me and considered in my medical decision making (see chart for details).  I have reviewed and evaluated the relevant imaging studies. I have interpreted the relevant EKG. I have reviewed the relevant previous healthcare records. I obtained HPI from historian.  ED Course:  Assessment:  Pt is a 46 year old female presents with symptoms consistent with influenza.  Vitals are stable, low-grade fever. No signs of dehydration, tolerating PO's. Lungs are clear. CXR negative for acute infiltrate. Pt treated with toradol injection while in ED. Discussed the cost versus benefit of Tamiflu treatment with the patient. The patient understands that  symptoms are greater than the recommended 24-48 hour window of treatment. Patient will be discharged with instructions to orally hydrate, rest, and use over-the-counter medications such as anti-inflammatories ibuprofen and Aleve for muscle aches and Tylenol for fever.  Patient will also be given a cough suppressant.   Disposition/Plan:  DC home Additional Verbal discharge instructions given and discussed with patient.  Pt Instructed to f/u with PCP in the next week for evaluation and treatment of symptoms. Return precautions given Pt acknowledges and agrees with plan  Supervising Physician Gwenyth Allegra Tegeler, MD  Final Clinical Impressions(s) / ED Diagnoses   Final diagnoses:  Flu-like symptoms    New Prescriptions New Prescriptions   No medications on file    I personally performed the services described in this documentation, which was scribed in my presence. The recorded information has been reviewed and is accurate.    Shary Decamp, PA-C 11/13/16 [id number]    Gwenyth Allegra Tegeler, MD 11/13/16 1058

## 2016-11-12 NOTE — Discharge Instructions (Signed)
Please read and follow all provided instructions.  Your diagnoses today include:  1. Flu-like symptoms    Tests performed today include: Chest Xray Vital signs. See below for your results today.   Medications prescribed:   Take any prescribed medications only as directed.  Home care instructions:  Follow any educational materials contained in this packet. Please continue drinking plenty of fluids. Use over-the-counter cold and flu medications as needed as directed on packaging for symptom relief. You may also use ibuprofen or tylenol as directed on packaging for pain or fever.   BE VERY CAREFUL not to take multiple medicines containing Tylenol (also called acetaminophen). Doing so can lead to an overdose which can damage your liver and cause liver failure and possibly death.   Follow-up instructions: Please follow-up with your primary care provider in the next 3 days for further evaluation of your symptoms.   Return instructions:  Please return to the Emergency Department if you experience worsening symptoms. Please return if you have a high fever greater than 101 degrees not controlled with over-the-counter medications, persistent vomiting and cannot keep down fluids, or worsening trouble breathing. Please return if you have any other emergent concerns.  Additional Information:  Your vital signs today were: BP (!) 158/105 (BP Location: Left Arm)    Pulse 105    Temp (S) 100.7 F (38.2 C)    Resp 20    Ht 5\' 2"  (1.575 m)    Wt 62.1 kg    LMP 10/29/2016    SpO2 100%    BMI 25.06 kg/m  If your blood pressure (BP) was elevated above 135/85 this visit, please have this repeated by your doctor within one month.

## 2016-11-12 NOTE — ED Triage Notes (Signed)
C/o prod cough, chest pressure, fever, sore throat x 2 days

## 2017-01-04 ENCOUNTER — Emergency Department (HOSPITAL_BASED_OUTPATIENT_CLINIC_OR_DEPARTMENT_OTHER)
Admission: EM | Admit: 2017-01-04 | Discharge: 2017-01-05 | Disposition: A | Discharge status: Home / Self Care | Payer: Medicaid Other | Attending: Emergency Medicine | Admitting: Emergency Medicine

## 2017-01-04 ENCOUNTER — Emergency Department (HOSPITAL_BASED_OUTPATIENT_CLINIC_OR_DEPARTMENT_OTHER): Payer: Medicaid Other

## 2017-01-04 ENCOUNTER — Encounter (HOSPITAL_BASED_OUTPATIENT_CLINIC_OR_DEPARTMENT_OTHER): Payer: Self-pay | Admitting: *Deleted

## 2017-01-04 DIAGNOSIS — Z853 Personal history of malignant neoplasm of breast: Secondary | ICD-10-CM | POA: Diagnosis not present

## 2017-01-04 DIAGNOSIS — I10 Essential (primary) hypertension: Secondary | ICD-10-CM | POA: Insufficient documentation

## 2017-01-04 DIAGNOSIS — M25512 Pain in left shoulder: Secondary | ICD-10-CM | POA: Diagnosis not present
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
## 2017-01-04 NOTE — ED Triage Notes (Signed)
Reports left shoulder pain x 1 week.  Hx of shoulder surgery.

## 2017-01-05 MED ORDER — METHOCARBAMOL 500 MG PO TABS
500.0000 mg | ORAL_TABLET | Freq: Every evening | ORAL | 0 refills | Status: DC | PRN
Start: 2017-01-05 — End: 2019-07-16

## 2017-01-05 MED ORDER — OXYCODONE-ACETAMINOPHEN 5-325 MG PO TABS
1.0000 | ORAL_TABLET | Freq: Once | ORAL | Status: AC
  Administered 2017-01-05: 1 via ORAL
  Filled 2017-01-05: qty 1

## 2017-01-05 MED ORDER — IBUPROFEN 200 MG PO TABS
600.0000 mg | ORAL_TABLET | Freq: Once | ORAL | Status: AC
  Administered 2017-01-05: 600 mg via ORAL
  Filled 2017-01-05: qty 1

## 2017-01-05 MED ORDER — IBUPROFEN 600 MG PO TABS: 600.0000 mg | ORAL_TABLET | Freq: Four times a day (QID) | ORAL | 0 refills | Status: DC | PRN

## 2017-01-05 MED ORDER — METHOCARBAMOL 500 MG PO TABS
500.0000 mg | ORAL_TABLET | Freq: Once | ORAL | Status: AC
  Administered 2017-01-05: 500 mg via ORAL
  Filled 2017-01-05: qty 1

## 2017-01-05 MED ORDER — HYDROCODONE-ACETAMINOPHEN 5-325 MG PO TABS: 2.0000 | ORAL_TABLET | ORAL | 0 refills | Status: DC | PRN

## 2017-01-05 NOTE — ED Notes (Signed)
Pt teaching provided on medications that may cause drowsiness. Pt instructed not to drive or operate heavy machinery while taking the prescribed medication. Pt verbalized understanding.   

## 2017-01-05 NOTE — Discharge Instructions (Signed)
Ice - ice for 20 minutes at a time, several times a day Wear sling to provide support. Try to do range of motion exercises to prevent shoulder from getting stiff Ibuprofen - take with food. Take up to 3-4 times daily Robaxin - muscle relaxer Norco - pain medicine Make appointment with Dr. Lorin Mercy

## 2017-01-10 ENCOUNTER — Ambulatory Visit (INDEPENDENT_AMBULATORY_CARE_PROVIDER_SITE_OTHER): Payer: Self-pay | Admitting: Orthopaedic Surgery

## 2017-11-13 DIAGNOSIS — E663 Overweight: Secondary | ICD-10-CM | POA: Diagnosis not present

## 2017-11-13 DIAGNOSIS — Z131 Encounter for screening for diabetes mellitus: Secondary | ICD-10-CM | POA: Diagnosis not present

## 2017-11-13 DIAGNOSIS — G43111 Migraine with aura, intractable, with status migrainosus: Secondary | ICD-10-CM | POA: Diagnosis not present

## 2017-11-13 DIAGNOSIS — R030 Elevated blood-pressure reading, without diagnosis of hypertension: Secondary | ICD-10-CM | POA: Diagnosis not present

## 2018-07-27 ENCOUNTER — Encounter (HOSPITAL_BASED_OUTPATIENT_CLINIC_OR_DEPARTMENT_OTHER): Payer: Self-pay | Admitting: *Deleted

## 2018-07-27 ENCOUNTER — Emergency Department (HOSPITAL_BASED_OUTPATIENT_CLINIC_OR_DEPARTMENT_OTHER)
Admission: EM | Admit: 2018-07-27 | Discharge: 2018-07-27 | Disposition: A | Discharge status: Home / Self Care | Payer: BLUE CROSS/BLUE SHIELD | Attending: Emergency Medicine | Admitting: Emergency Medicine

## 2018-07-27 ENCOUNTER — Other Ambulatory Visit: Payer: Self-pay

## 2018-07-27 DIAGNOSIS — Z853 Personal history of malignant neoplasm of breast: Secondary | ICD-10-CM | POA: Diagnosis not present

## 2018-07-27 DIAGNOSIS — R590 Localized enlarged lymph nodes: Secondary | ICD-10-CM | POA: Diagnosis not present

## 2018-07-27 DIAGNOSIS — Z79899 Other long term (current) drug therapy: Secondary | ICD-10-CM | POA: Insufficient documentation

## 2018-07-27 DIAGNOSIS — G43009 Migraine without aura, not intractable, without status migrainosus: Secondary | ICD-10-CM

## 2018-07-27 DIAGNOSIS — I10 Essential (primary) hypertension: Secondary | ICD-10-CM | POA: Diagnosis not present

## 2018-07-27 DIAGNOSIS — R51 Headache: Secondary | ICD-10-CM | POA: Diagnosis not present

## 2018-07-27 LAB — CBC WITH DIFFERENTIAL/PLATELET
BASOS PCT: 0 %
Basophils Absolute: 0 10*3/uL (ref 0.0–0.1)
Eosinophils Absolute: 0.2 10*3/uL (ref 0.0–0.7)
Eosinophils Relative: 2 %
HEMATOCRIT: 40.2 % (ref 36.0–46.0)
HEMOGLOBIN: 14 g/dL (ref 12.0–15.0)
Lymphocytes Relative: 32 %
Lymphs Abs: 2.7 10*3/uL (ref 0.7–4.0)
MCH: 29.8 pg (ref 26.0–34.0)
MCHC: 34.8 g/dL (ref 30.0–36.0)
MCV: 85.5 fL (ref 78.0–100.0)
MONOS PCT: 13 %
Monocytes Absolute: 1.1 10*3/uL — ABNORMAL HIGH (ref 0.1–1.0)
NEUTROS ABS: 4.4 10*3/uL (ref 1.7–7.7)
NEUTROS PCT: 53 %
Platelets: 243 10*3/uL (ref 150–400)
RBC: 4.7 MIL/uL (ref 3.87–5.11)
RDW: 13.2 % (ref 11.5–15.5)
WBC: 8.4 10*3/uL (ref 4.0–10.5)

## 2018-07-27 LAB — BASIC METABOLIC PANEL
Anion gap: 13 (ref 5–15)
BUN: 9 mg/dL (ref 6–20)
CO2: 24 mmol/L (ref 22–32)
Calcium: 8.4 mg/dL — ABNORMAL LOW (ref 8.9–10.3)
Chloride: 96 mmol/L — ABNORMAL LOW (ref 98–111)
Creatinine, Ser: 0.75 mg/dL (ref 0.44–1.00)
GFR calc non Af Amer: >60 mL/min (ref >60)
Glucose, Bld: 100 mg/dL — ABNORMAL HIGH (ref 70–99)
Potassium: 3 mmol/L — ABNORMAL LOW (ref 3.5–5.1)
Sodium: 133 mmol/L — ABNORMAL LOW (ref 135–145)

## 2018-07-27 LAB — PREGNANCY, URINE: Preg Test, Ur: NEGATIVE

## 2018-07-27 MED ORDER — AMOXICILLIN-POT CLAVULANATE 875-125 MG PO TABS: 1.0000 | ORAL_TABLET | Freq: Two times a day (BID) | ORAL | 0 refills | Status: AC

## 2018-07-27 MED ORDER — DIPHENHYDRAMINE HCL 50 MG/ML IJ SOLN
25.0000 mg | Freq: Once | INTRAMUSCULAR | Status: AC
  Administered 2018-07-27: 25 mg via INTRAVENOUS
  Filled 2018-07-27: qty 1

## 2018-07-27 MED ORDER — POTASSIUM CHLORIDE CRYS ER 20 MEQ PO TBCR
40.0000 meq | EXTENDED_RELEASE_TABLET | Freq: Once | ORAL | Status: AC
Start: 2018-07-27 — End: 2018-07-27
  Administered 2018-07-27: 40 meq via ORAL
  Filled 2018-07-27: qty 2

## 2018-07-27 MED ORDER — SODIUM CHLORIDE 0.9 % IV BOLUS
1000.0000 mL | Freq: Once | INTRAVENOUS | Status: AC
  Administered 2018-07-27: 1000 mL via INTRAVENOUS

## 2018-07-27 MED ORDER — KETOROLAC TROMETHAMINE 15 MG/ML IJ SOLN
15.0000 mg | Freq: Once | INTRAMUSCULAR | Status: AC
  Administered 2018-07-27: 15 mg via INTRAVENOUS
  Filled 2018-07-27: qty 1

## 2018-07-27 MED ORDER — METOCLOPRAMIDE HCL 5 MG/ML IJ SOLN
10.0000 mg | Freq: Once | INTRAMUSCULAR | Status: AC
  Administered 2018-07-27: 10 mg via INTRAVENOUS
  Filled 2018-07-27: qty 2

## 2018-07-27 MED ORDER — DEXAMETHASONE SODIUM PHOSPHATE 10 MG/ML IJ SOLN
10.0000 mg | Freq: Once | INTRAMUSCULAR | Status: AC
  Administered 2018-07-27: 10 mg via INTRAVENOUS
  Filled 2018-07-27: qty 1

## 2018-07-27 MED ORDER — ONDANSETRON 4 MG PO TBDP: 4.0000 mg | ORAL_TABLET | Freq: Three times a day (TID) | ORAL | 0 refills | Status: DC | PRN

## 2018-07-27 NOTE — ED Provider Notes (Signed)
[facility] EMERGENCY DEPARTMENT Provider Note   CSN: [id number] Arrival date & time: 07/27/18  1935     History   Chief Complaint Chief Complaint  Patient presents with  . Headache    HPI [name] is a 47 y.o. female.  HPI   47 year old female here with multiple complaints.  Patient's primary complaint is migraine headache.  She has well-documented history of chronic migraines and takes Topamax.  She states that 2 days ago, she developed gradual onset of progressively worsening aching, throbbing, generalized but primarily left-sided headache.  This is usual for her migraines.  She said nausea but no vomiting.  She denies any fevers.  No neck pain or neck stiffness.  No visual changes.  Headache is similar to her previous migraines.  Patient also notes mild swelling and pain along her left upper neck in the ear.  She is noticed an enlarged lymph node.  She does have children with possible URI symptoms, though she has not had any herself.  She does note some mild pain with eating and moving her jaw.  No ear pain or discharge.  No sore throat.  No difficulty swallowing.  Past Medical History:  Diagnosis Date  . Breast cancer ([place])   . Hypertension   . Migraine   . UTI (lower urinary tract infection)     Patient Active Problem List   Diagnosis Date Noted  . Migraine 12/02/2013    Past Surgical History:  Procedure Laterality Date  . BREAST SURGERY    . CHOLECYSTECTOMY    . ROTATOR CUFF REPAIR       OB History   None      Home Medications    Prior to Admission medications   Medication Sig Start Date End Date Taking? Authorizing Provider  Topiramate (TOPAMAX PO) Take by mouth.   Yes [provider]  amoxicillin-clavulanate (AUGMENTIN) 875-125 MG tablet Take 1 tablet by mouth every 12 (twelve) hours for 10 days. 07/27/18 08/06/18  Duffy Bruce, MD  HYDROcodone-acetaminophen (NORCO/VICODIN) 5-325 MG tablet Take 2 tablets by mouth every 4  (four) hours as needed. 01/05/17   Recardo Evangelist, PA-C  ibuprofen (ADVIL,MOTRIN) 600 MG tablet Take 1 tablet (600 mg total) by mouth every 6 (six) hours as needed. 01/05/17   Recardo Evangelist, PA-C  methocarbamol (ROBAXIN) 500 MG tablet Take 1 tablet (500 mg total) by mouth at bedtime and may repeat dose one time if needed. 01/05/17   Recardo Evangelist, PA-C  ondansetron (ZOFRAN ODT) 4 MG disintegrating tablet Take 1 tablet (4 mg total) by mouth every 8 (eight) hours as needed for nausea or vomiting. 07/27/18   Duffy Bruce, MD    Family History No family history on file.  Social History Social History   Tobacco Use  . Smoking status: Never Smoker  . Smokeless tobacco: Never Used  Substance Use Topics  . Alcohol use: No  . Drug use: No     Allergies   Midazolam hcl   Review of Systems Review of Systems  Constitutional: Positive for fatigue. Negative for chills and fever.  HENT: Negative for congestion and rhinorrhea.   Eyes: Negative for visual disturbance.  Respiratory: Negative for cough, shortness of breath and wheezing.   Cardiovascular: Negative for chest pain and leg swelling.  Gastrointestinal: Negative for abdominal pain, diarrhea, nausea and vomiting.  Genitourinary: Negative for dysuria and flank pain.  Musculoskeletal: Negative for neck pain and neck stiffness.  Skin: Negative for rash and  wound.  Allergic/Immunologic: Negative for immunocompromised state.  Neurological: Positive for headaches. Negative for syncope and weakness.  Hematological: Positive for adenopathy.  All other systems reviewed and are negative.    Physical Exam Updated Vital Signs BP (!) 158/86 (BP Location: Right Arm)   Pulse 87   Temp 98.7 F (37.1 C) (Oral)   Resp 18   Ht 5\' 1"  (1.549 m)   Wt 59 kg   LMP 07/13/2018   SpO2 99%   BMI 24.56 kg/m   Physical Exam  Constitutional: She is oriented to person, place, and time. She appears well-developed and well-nourished. No  distress.  HENT:  Head: Normocephalic and atraumatic.  Mouth/Throat: Oropharynx is clear and moist.  Mild TTP noted to left sublingual salivary gland  Eyes: Conjunctivae are normal.  Neck: Neck supple.  Mildly tender left submandibular swelling.  Mild tenderness with palpation.    Cardiovascular: Normal rate, regular rhythm and normal heart sounds. Exam reveals no friction rub.  No murmur heard. Pulmonary/Chest: Effort normal and breath sounds normal. No respiratory distress. She has no wheezes. She has no rales.  Abdominal: She exhibits no distension.  Musculoskeletal: She exhibits no edema.  Neurological: She is alert and oriented to person, place, and time. She exhibits normal muscle tone.  Skin: Skin is warm. Capillary refill takes less than 2 seconds.  Psychiatric: She has a normal mood and affect.  Nursing note and vitals reviewed.  Neurological Exam:  Mental Status: Alert and oriented to person, place, and time. Attention and concentration normal. Speech clear. Recent memory is intact. Cranial Nerves: Visual fields grossly intact. EOMI and PERRLA. No nystagmus noted. Facial sensation intact at forehead, maxillary cheek, and chin/mandible bilaterally. No facial asymmetry or weakness. Hearing grossly normal. Uvula is midline, and palate elevates symmetrically. Normal SCM and trapezius strength. Tongue midline without fasciculations. Motor: Muscle strength 5/5 in proximal and distal UE and LE bilaterally. No pronator drift. Muscle tone normal. Reflexes: 2+ and symmetrical in all four extremities.  Sensation: Intact to light touch in upper and lower extremities distally bilaterally.  Gait: Normal without ataxia. Coordination: Normal FTN bilaterally.      ED Treatments / Results  Labs (all labs ordered are listed, but only abnormal results are displayed) Labs Reviewed  CBC WITH DIFFERENTIAL/PLATELET - Abnormal; Notable for the following components:      Result Value   Monocytes  Absolute 1.1 (*)    All other components within normal limits  BASIC METABOLIC PANEL - Abnormal; Notable for the following components:   Sodium 133 (*)    Potassium 3.0 (*)    Chloride 96 (*)    Glucose, Bld 100 (*)    Calcium 8.4 (*)    All other components within normal limits  PREGNANCY, URINE    EKG None  Radiology No results found.  Procedures Procedures (including critical care time)  Medications Ordered in ED Medications  sodium chloride 0.9 % bolus 1,000 mL ( Intravenous Stopped 07/27/18 2150)  metoCLOPramide (REGLAN) injection 10 mg (10 mg Intravenous Given 07/27/18 2049)  diphenhydrAMINE (BENADRYL) injection 25 mg (25 mg Intravenous Given 07/27/18 2048)  dexamethasone (DECADRON) injection 10 mg (10 mg Intravenous Given 07/27/18 2049)  ketorolac (TORADOL) 15 MG/ML injection 15 mg (15 mg Intravenous Given 07/27/18 2048)  potassium chloride SA (K-DUR,KLOR-CON) CR tablet 40 mEq (40 mEq Oral Given 07/27/18 2132)     Initial Impression / Assessment and Plan / ED Course  I have reviewed the triage vital signs and the nursing  notes.  Pertinent labs & imaging results that were available during my care of the patient were reviewed by me and considered in my medical decision making (see chart for details).     47 year old female here with multiple complaints.  Regarding her headache, this is similar to her usual migraines.  She has no new red flag symptoms.  No fevers, chills, neck pain, neck stiffness, or evidence of meningitis or encephalitis.  No focal neurological deficits.  She feels markedly improved with migraine cocktail and will treat supportively.  Continue her home meds.  Regarding her lymphadenopathy, this could be an early pharyngitis or URI given her sick contacts.  However, she also has mild edema of her salivary duct, and tenderness is somewhat of her submandibular gland.  Given that this could be precipitating her recurrent migraines, will treat with Augmentin for  possible early sial adenitis.  Otherwise, she is mildly hypokalemic likely due to her poor p.o. intake from her migraines and she is been replaced here.  She is been able to eat and drink.  Will discharge with good return precautions.  Final Clinical Impressions(s) / ED Diagnoses   Final diagnoses:  Migraine without aura and without status migrainosus, not intractable  Cervical lymphadenopathy    ED Discharge Orders         Ordered    amoxicillin-clavulanate (AUGMENTIN) 875-125 MG tablet  Every 12 hours     07/27/18 2226    ondansetron (ZOFRAN ODT) 4 MG disintegrating tablet  Every 8 hours PRN     07/27/18 2226           Duffy Bruce, MD 07/27/18 2348

## 2018-07-27 NOTE — ED Notes (Signed)
ED Provider at bedside. 

## 2018-07-27 NOTE — ED Triage Notes (Signed)
Headache x 2 days. Lymph nodes in the left side of her neck are swollen.

## 2018-07-27 NOTE — Discharge Instructions (Addendum)
Take the antibiotics as prescribed  Drink plenty of fluids  If you notice worsening swelling, new areas of swelling, fevers, neck stiffness, or other concerning symptoms, return to the ER

## 2018-08-28 ENCOUNTER — Other Ambulatory Visit: Payer: Self-pay

## 2018-08-28 ENCOUNTER — Emergency Department (HOSPITAL_BASED_OUTPATIENT_CLINIC_OR_DEPARTMENT_OTHER)
Admission: EM | Admit: 2018-08-28 | Discharge: 2018-08-28 | Disposition: A | Discharge status: Home / Self Care | Payer: Medicaid Other | Attending: Emergency Medicine | Admitting: Emergency Medicine

## 2018-08-28 ENCOUNTER — Encounter (HOSPITAL_BASED_OUTPATIENT_CLINIC_OR_DEPARTMENT_OTHER): Payer: Self-pay | Admitting: Emergency Medicine

## 2018-08-28 ENCOUNTER — Emergency Department (HOSPITAL_BASED_OUTPATIENT_CLINIC_OR_DEPARTMENT_OTHER): Payer: Medicaid Other

## 2018-08-28 DIAGNOSIS — Z9049 Acquired absence of other specified parts of digestive tract: Secondary | ICD-10-CM | POA: Insufficient documentation

## 2018-08-28 DIAGNOSIS — Z79899 Other long term (current) drug therapy: Secondary | ICD-10-CM | POA: Insufficient documentation

## 2018-08-28 DIAGNOSIS — M25511 Pain in right shoulder: Secondary | ICD-10-CM | POA: Diagnosis not present

## 2018-08-28 DIAGNOSIS — Z853 Personal history of malignant neoplasm of breast: Secondary | ICD-10-CM | POA: Diagnosis not present

## 2018-08-28 DIAGNOSIS — I10 Essential (primary) hypertension: Secondary | ICD-10-CM | POA: Diagnosis not present

## 2018-08-28 MED ORDER — KETOROLAC TROMETHAMINE 30 MG/ML IJ SOLN
30.0000 mg | Freq: Once | INTRAMUSCULAR | Status: AC
  Administered 2018-08-28: 30 mg via INTRAMUSCULAR
  Filled 2018-08-28: qty 1

## 2018-08-28 MED ORDER — CYCLOBENZAPRINE HCL 10 MG PO TABS: 10.0000 mg | ORAL_TABLET | Freq: Three times a day (TID) | ORAL | 0 refills | Status: DC | PRN

## 2018-08-28 NOTE — ED Triage Notes (Signed)
Pt states she is having right shoulder pain for a few days.  No known injury.  History of right shoulder surgery, rotator cuff, 3 years ago.  Pt states she cannot move her arm without pain.

## 2018-08-28 NOTE — Discharge Instructions (Signed)
If you develop fever, swelling to the shoulder, intractable pain, or any other new/concerning symptoms and return to the ER for evaluation.  Otherwise follow-up with the orthopedist listed.  Continue to take ibuprofen, make sure you take Tylenol, as well as the muscle relaxer given.  The muscle relaxer can cause sedation or other side effects and so do not take this and comminution with alcohol or other depressants, and do not drive or operate heavy machinery while using this.
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
## 2018-09-09 ENCOUNTER — Other Ambulatory Visit: Payer: Self-pay

## 2018-09-09 ENCOUNTER — Ambulatory Visit: Payer: Medicaid Other | Attending: Orthopedic Surgery | Admitting: Physical Therapy

## 2018-09-09 DIAGNOSIS — M25611 Stiffness of right shoulder, not elsewhere classified: Secondary | ICD-10-CM | POA: Insufficient documentation

## 2018-09-09 DIAGNOSIS — R293 Abnormal posture: Secondary | ICD-10-CM | POA: Diagnosis present

## 2018-09-09 DIAGNOSIS — M6281 Muscle weakness (generalized): Secondary | ICD-10-CM | POA: Diagnosis present

## 2018-09-09 DIAGNOSIS — M25511 Pain in right shoulder: Secondary | ICD-10-CM

## 2018-09-09 NOTE — Patient Instructions (Addendum)
TENS UNIT  This is helpful for muscle pain and spasm.   Search and Purchase a TENS 7000 2nd edition at www.tenspros.com or www.amazon.com  (It should be less than $30)     TENS unit instructions:   Do not shower or bathe with the unit on  Turn the unit off before removing electrodes or batteries  If the electrodes lose stickiness add a drop of water to the electrodes after they are disconnected from the unit and place on plastic sheet. If you continued to have difficulty, call the TENS unit company to purchase more electrodes.  Do not apply lotion on the skin area prior to use. Make sure the skin is clean and dry as this will help prolong the life of the electrodes.  After use, always check skin for unusual red areas, rash or other skin difficulties. If there are any skin problems, does not apply electrodes to the same area.  Never remove the electrodes from the unit by pulling the wires.  Do not use the TENS unit or electrodes other than as directed.  Do not change electrode placement without consulting your therapist or physician.  Keep 2 fingers with between each electrode.   TENS stands for Transcutaneous Electrical Nerve Stimulation. In other words, electrical impulses are allowed to pass through the skin in order to excite a nerve.   Purpose and Use of TENS:  TENS is a method used to manage acute and chronic pain without the use of drugs. It has been effective in managing pain associated with surgery, sprains, strains, trauma, rheumatoid arthritis, and neuralgias. It is a non-addictive, low risk, and non-invasive technique used to control pain. It is not, by any means, a curative form of treatment.   How TENS Works:  Most TENS units are a small pocket-sized unit powered by one 9 volt battery. Attached to the outside of the unit are two lead wires where two pins and/or snaps connect on each wire. All units come with a set of four reusable pads or electrodes. These are placed  on the skin surrounding the area involved. By inserting the leads into  the pads, the electricity can pass from the unit making the circuit complete.  As the intensity is turned up slowly, the electrical current enters the body from the electrodes through the skin to the surrounding nerve fibers. This triggers the release of hormones from within the body. These hormones contain pain relievers. By increasing the circulation of these hormones, the person's pain may be lessened. It is also believed that the electrical stimulation itself helps to block the pain messages being sent to the brain, thus also decreasing the body's perception of pain.   Hazards:  TENS units are NOT to be used by patients with PACEMAKERS, DEFIBRILLATORS, DIABETIC PUMPS, PREGNANT WOMEN, and patients with SEIZURE DISORDERS.  TENS units are NOT to be used over the heart, throat, brain, or spinal cord.  One of the major side effects from the TENS unit may be skin irritation. Some people may develop a rash if they are sensitive to the materials used in the electrodes or the connecting wires.   Wear the unit for up to 30 minutes at a time.   Avoid overuse due the body getting used to the stem making it not as effective over time.    

## 2018-09-09 NOTE — Therapy (Addendum)
[facility] [address]  [place], [place], [address] Phone: [phone]   Fax:  [phone]  Physical Therapy Evaluation  Patient Details  Name: [name] MRN: [id number] Date of Birth: 1971/07/29 Referring Provider (PT): Gary Fleet, [place]   Encounter Date: 09/09/2018  PT End of Session - 09/09/18 0932    Visit Number  1    Number of Visits  16    Date for PT Re-Evaluation  11/13/18    Authorization Type  Medicaid    PT Start Time  0932    PT Stop Time  1028    PT Time Calculation (min)  56 min    Activity Tolerance  Patient limited by pain    Behavior During Therapy  [facility] for tasks assessed/performed       Past Medical History:  Diagnosis Date  . Breast cancer ([place])   . Hypertension   . Migraine   . UTI (lower urinary tract infection)     Past Surgical History:  Procedure Laterality Date  . BREAST SURGERY    . CHOLECYSTECTOMY    . ROTATOR CUFF REPAIR      There were no vitals filed for this visit.   Subjective Assessment - 09/09/18 0936    Subjective  Pt reports remote h/o R shoulder RTC repair 4 yrs ago - post-op PT with good return of function and resolution of pain. New onset of R shoulder pain ~2 weeks ago w/o known MOI.    Limitations  House hold activities    Diagnostic tests  R shoulder x-ray 08/28/18: Minimal degenerative change of the [facility] joint and glenohumeral joints.    Patient Stated Goals  "back to normal"    Currently in Pain?  Yes    Pain Score  5     Pain Location  Shoulder    Pain Orientation  Right;Lateral   "inside"   Pain Descriptors / Indicators  Aching;Stabbing    Pain Type  Acute pain    Pain Radiating Towards  n/a    Pain Onset  1 to 4 weeks ago    Pain Frequency  Constant   varies in intensity   Aggravating Factors   movements into flexion    Pain Relieving Factors  pain meds, ice    Effect of Pain on Daily Activities  unable to use R UE with typical ADLs  and household chores         [facility] PT Assessment - 09/09/18 0932      Assessment   Medical Diagnosis  Acute R shoulder pain    Referring Provider (PT)  Gary Fleet, PA-C    Onset Date/Surgical Date  --   ~1-2 weeks   Hand Dominance  Right    Next MD Visit  09/10/18    Prior Therapy  PT s/p R RTC/Bankart repair ~4 yrs ago      Balance Screen   Has the patient fallen in the past 6 months  No    Has the patient had a decrease in activity level because of a fear of falling?   No    Is the patient reluctant to leave their home because of a fear of falling?   No      Home Film/video editor residence      Prior Function   Level of Independence  Independent    Vocation  Works at home    Leisure  sing, mostly sedentary      Posture/Postural Control   Posture/Postural Control  Postural limitations    Postural Limitations  Forward head;Rounded Shoulders      ROM / Strength   AROM / PROM / Strength  AROM;PROM;Strength      AROM   Overall AROM   Deficits;Due to pain    AROM Assessment Site  Shoulder    Right/Left Shoulder  Right;Left    Right Shoulder Flexion  72 Degrees   pain   Right Shoulder ABduction  81 Degrees    Left Shoulder Flexion  170 Degrees    Left Shoulder ABduction  172 Degrees      PROM   Overall PROM   Deficits;Due to pain    PROM Assessment Site  Shoulder    Right/Left Shoulder  Right    Right Shoulder Flexion  125 Degrees    Right Shoulder ABduction  133 Degrees    Right Shoulder Internal Rotation  71 Degrees    Right Shoulder External Rotation  40 Degrees      Strength   Strength Assessment Site  Shoulder    Right/Left Shoulder  Right    Right Shoulder Flexion  2+/5    Right Shoulder ABduction  2+/5    Right Shoulder Internal Rotation  3-/5    Right Shoulder External Rotation  2-/5                Objective measurements completed on examination: See above findings.      [facility] PT Treatment/Exercise -  09/09/18 0932      Exercises   Exercises  Shoulder      Shoulder Exercises: Supine   External Rotation  Right;10 reps;AAROM    External Rotation Limitations  wand    Flexion  Right;AAROM;10 reps    Flexion Limitations  wand    Other Supine Exercises  B scap retraction isometric into mat table 5 x 5"      Modalities   Modalities  Electrical Stimulation;Cryotherapy      Cryotherapy   Number Minutes Cryotherapy  15 Minutes    Cryotherapy Location  Shoulder   Rt   Type of Cryotherapy  Ice pack      Electrical Stimulation   Electrical Stimulation Location  R shoulder complex    Electrical Stimulation Action  IFC    Electrical Stimulation Parameters  80-150 Hz, intensity to pt tol x15'    Electrical Stimulation Goals  Pain             PT Education - 09/09/18 1012    Education Details  PT eval findings, anticipated POC & initial HEP    Person(s) Educated  Patient    Methods  Explanation;Demonstration;Handout    Comprehension  Verbalized understanding;Returned demonstration       PT Short Term Goals - 09/09/18 1028      PT SHORT TERM GOAL #1   Title  Independent with initial HEP    Status  New    Target Date  09/30/18      PT SHORT TERM GOAL #2   Title  Patient to demonstrate appropriate posture and body mechanics to reduce risk of impingment with daily activities    Status  New    Target Date  09/30/18      PT SHORT TERM GOAL #3   Title  R shoulder flexion & abduction AROM >/= 90 dg w/o increased pain    Status  New    Target Date  09/30/18        PT Long Term Goals - 09/09/18 1028      PT LONG TERM GOAL #1   Title  Independent with ongoing/advanced HEP    Status  New    Target Date  11/13/18      PT LONG TERM GOAL #2   Title  R shoulder AROM WFL w/o pain provocation    Status  New    Target Date  11/13/18      PT LONG TERM GOAL #3   Title  R shoulder strength >/= 4/5 for improved functional use of R UE    Status  New    Target Date  11/13/18       PT LONG TERM GOAL #4   Title  Patient to report ability to perform ADLs and household chores without limitaiton due to R shoulder pain    Status  New    Target Date  11/13/18             Plan - 09/09/18 1028    Clinical Impression Statement  [name] is a 47 y/o female who presents to OP PT with acute onset of R shoulder pain ~2 weeks ago w/o known MOI. She has a history of R RTC/Bankart repair ~4 years ago but reports full return of function w/o pain following post-surgical rehab. Pt reports R shoulder pain worse with reaching into flexion vs abduction, but severe deficits noted due to pain in both AROM & PROM for all R shoulder motions. Pt also demonstrates postural abnormalities with rounded shoulders and protracted scapula on R as well as increased muscle tension and decreased flexibility throughout R shoulder complex. Strength limited to <3/5 due to limited ROM. Pain prevents pt from lifting or carrying anything with R hand and limits her ability to complete all ADLs and household chores. History of pain and pattern of restriction potentially indicative of adhesive capsulitis. [name] will benefit from skilled PT to address above listed deficits to restore functional use of R UE with ADLs and household chores.    History and Personal Factors relevant to plan of care:  R shoulder RTC/Bankart repair ~4 yrs ago    Clinical Presentation  Evolving    Clinical Presentation due to:  acute onset of pain w/o known MOI <2 wks ago with significant worsening of pain since onset    Clinical Decision Making  Low    Rehab Potential  Good    PT Frequency  2x / week   1x/wk for initial Medicaid authorizartion, then 2x/wk for duration of POC   PT Duration  --   9 weeks   PT Treatment/Interventions  Patient/family education;Manual techniques;Passive range of motion;Taping;Dry needling;Electrical Stimulation;Cryotherapy;Moist Heat;Ultrasound;Iontophoresis 4mg /ml Dexamethasone;Therapeutic  exercise;Therapeutic activities;Neuromuscular re-education;ADLs/Self Care Home Management;Vasopneumatic Device    PT Next Visit Plan  Review initial HEP; gently progress scapular strengthening & R shoulder P/AA/AROM; Manual therapy & modalities PRN for pain    PT Home Exercise Plan  Scap retraction; wand AAROM    Consulted and Agree with Plan of Care  Patient       Patient will benefit from skilled therapeutic intervention in order to improve the following deficits and impairments:  Pain, Increased muscle spasms, Hypomobility, Decreased range of motion, Decreased strength, Postural dysfunction, Improper body mechanics, Impaired UE functional use, Decreased activity tolerance  Visit Diagnosis: Acute pain of right shoulder - Plan: PT plan of care cert/re-cert  Stiffness of right shoulder, not elsewhere classified - Plan: PT plan of  care cert/re-cert  Abnormal posture - Plan: PT plan of care cert/re-cert  Muscle weakness (generalized) - Plan: PT plan of care cert/re-cert     Problem List Patient Active Problem List   Diagnosis Date Noted  . Migraine 12/02/2013    Percival Spanish, PT, MPT 09/09/2018, 1:23 PM  [facility] [address]  [place], [place], [address] Phone: [phone]   Fax:  [phone]  Name: [name] MRN: [id number] Date of Birth: 07/01/1971

## 2018-09-25 ENCOUNTER — Encounter: Payer: Self-pay | Admitting: Physical Therapy

## 2018-09-25 ENCOUNTER — Ambulatory Visit: Payer: Medicaid Other | Attending: Orthopedic Surgery | Admitting: Physical Therapy

## 2018-09-25 DIAGNOSIS — M25611 Stiffness of right shoulder, not elsewhere classified: Secondary | ICD-10-CM

## 2018-09-25 DIAGNOSIS — M25511 Pain in right shoulder: Secondary | ICD-10-CM | POA: Diagnosis not present

## 2018-09-25 DIAGNOSIS — M6281 Muscle weakness (generalized): Secondary | ICD-10-CM

## 2018-09-25 DIAGNOSIS — R293 Abnormal posture: Secondary | ICD-10-CM | POA: Diagnosis present

## 2018-09-25 NOTE — Therapy (Addendum)
[facility] [address]  [place], [place], [address] Phone: [phone]   Fax:  [phone]  Physical Therapy Treatment  Patient Details  Name: RAYCHELL HOLCOMB MRN: [id number] Date of Birth: [birth year] Referring Provider (PT): Gary Fleet, [place]   Encounter Date: 09/25/2018  PT End of Session - 09/25/18 0812    Visit Number  2    Number of Visits  16    Date for PT Re-Evaluation  11/13/18    Authorization Type  Medicaid    Authorization Time Period  09/21/18 -10/11/18    Authorization - Visit Number  1    Authorization - Number of Visits  3    PT Start Time  4268   Pt arrived late   PT Stop Time  0854    PT Time Calculation (min)  42 min    Activity Tolerance  Patient limited by pain    Behavior During Therapy  [facility] for tasks assessed/performed       Past Medical History:  Diagnosis Date  . Breast cancer ([place])   . Hypertension   . Migraine   . UTI (lower urinary tract infection)     Past Surgical History:  Procedure Laterality Date  . BREAST SURGERY    . CHOLECYSTECTOMY    . ROTATOR CUFF REPAIR      There were no vitals filed for this visit.  Subjective Assessment - 09/25/18 0814    Subjective  Pt reporting shoulder feels a little better.    Limitations  House hold activities    Diagnostic tests  R shoulder x-ray 08/28/18: Minimal degenerative change of the [facility] joint and glenohumeral joints.    Patient Stated Goals  "back to normal"    Currently in Pain?  Yes    Pain Score  5     Pain Location  Shoulder    Pain Orientation  Right    Pain Descriptors / Indicators  Aching    Pain Type  Acute pain    Pain Frequency  Constant                       [facility] Adult PT Treatment/Exercise - 09/25/18 0812      Exercises   Exercises  Shoulder      Shoulder Exercises: Supine   External Rotation  Right;15 reps;AAROM    External Rotation Limitations  wand    Flexion  Right;15  reps;AAROM    Flexion Limitations  wand      Shoulder Exercises: Standing   Extension  Both;10 reps;Theraband;Strengthening    Theraband Level (Shoulder Extension)  Level 1 (Yellow)    Extension Limitations  cues for scap retraction & depression with shoulder extension to neutral    Row  Both;12 reps;Theraband;Strengthening    Theraband Level (Shoulder Row)  Level 1 (Yellow)    Row Limitations  cues for scap retraction & depression      Shoulder Exercises: Pulleys   Flexion  2 minutes    Flexion Limitations  limited tolerance - poor ROM      Shoulder Exercises: ROM/Strengthening   Pendulum  R shoulder - fwd/back, side to side & CW/CCW x 2 min each       Shoulder Exercises: Stretch   Corner Stretch  30 seconds;2 reps    Corner Stretch Limitations  low position doorway stretch      Modalities   Modalities  Iontophoresis      Iontophoresis  Type of Iontophoresis  Dexamethasone    Location  R anterior shoulder    Dose  80 mA-min, 1.0 mL    Time  4-6 hr patch (#1 of 6)      Manual Therapy   Manual Therapy  Joint mobilization;Soft tissue mobilization;Myofascial release;Passive ROM    Manual therapy comments  supine    Joint Mobilization  Grade I-II R shoulder distraction and inferior glide for pain control, unable to tolerate A/P glides    Soft tissue mobilization  Gentle STM to pecs, ant deltoid & biceps - limited tolerance    Myofascial Release  Attempted TPR to pecs & ant delltoid but pt unable to tolerate    Passive ROM  Gentle R shoulder PROM in all directions - limited tolerance with increased guarding             PT Education - 09/25/18 0854    Education Details  Ionto patch instructions; HEP update    Person(s) Educated  Patient    Methods  Explanation;Demonstration;Handout    Comprehension  Verbalized understanding;Returned demonstration;Need further instruction       PT Short Term Goals - 09/25/18 0817      PT SHORT TERM GOAL #1   Title  Independent  with initial HEP    Status  On-going      PT SHORT TERM GOAL #2   Title  Patient to demonstrate appropriate posture and body mechanics to reduce risk of impingment with daily activities    Status  On-going      PT SHORT TERM GOAL #3   Title  R shoulder flexion & abduction AROM >/= 90 dg w/o increased pain    Status  On-going        PT Long Term Goals - 09/25/18 0817      PT LONG TERM GOAL #1   Title  Independent with ongoing/advanced HEP    Status  On-going      PT LONG TERM GOAL #2   Title  R shoulder AROM WFL w/o pain provocation    Status  On-going      PT LONG TERM GOAL #3   Title  R shoulder strength >/= 4/5 for improved functional use of R UE    Status  On-going      PT LONG TERM GOAL #4   Title  Patient to report ability to perform ADLs and household chores without limitaiton due to R shoulder pain    Status  On-going            Plan - 09/25/18 0817    Clinical Impression Statement  Mykayla reporting pain slightly better (although pain rating unchanged) and feels like she can move her shoulder slightly better. HEP reviewed with pt demsontrating good technique with wand exercises but still very limited ROM. Poor tolerance for manual PROM or AAROM with pulleys, therefore provided instruction in pendulum exercises to reduce risk for further LOM/freezing of shoulder. Pt able to tolerate addition of yellow TB resistance to scapular retraction, therefore rows and mini-shoulder extensions added to HEP. Pt very ttp over anterior shoulder with increased muscle tension noted but poor tolerance to manual therapy for STM or MFR/TPR therefore initiated trial of ionto patch to hopefully reduce pain and inflammation to improve tolerance for future manual therapy attempts.    Rehab Potential  Good    PT Frequency  --   1x/wk for initial Medicaid authorizartion, then 2x/wk for duration of POC   PT Treatment/Interventions  Patient/family  education;Manual techniques;Passive range of  motion;Taping;Dry needling;Electrical Stimulation;Cryotherapy;Moist Heat;Ultrasound;Iontophoresis 4mg /ml Dexamethasone;Therapeutic exercise;Therapeutic activities;Neuromuscular re-education;ADLs/Self Care Home Management;Vasopneumatic Device    PT Next Visit Plan  Assess response to ionto patch; Review updated HEP; gently progress scapular strengthening & R shoulder P/AA/AROM; Manual therapy & modalities PRN for pain    PT Home Exercise Plan  Scap retraction; wand AAROM; pendulums; therband rows (yellow TB)    Consulted and Agree with Plan of Care  Patient       Patient will benefit from skilled therapeutic intervention in order to improve the following deficits and impairments:  Pain, Increased muscle spasms, Hypomobility, Decreased range of motion, Decreased strength, Postural dysfunction, Improper body mechanics, Impaired UE functional use, Decreased activity tolerance  Visit Diagnosis: Acute pain of right shoulder  Stiffness of right shoulder, not elsewhere classified  Abnormal posture  Muscle weakness (generalized)     Problem List Patient Active Problem List   Diagnosis Date Noted  . Migraine 12/02/2013    Percival Spanish, PT, MPT 09/25/2018, 11:03 AM  [facility] [address]  [place], [place], [address] Phone: [phone]   Fax:  [phone]  Name: JANIJAH SYMONS MRN: [id number] Date of Birth: [birth year]

## 2018-09-25 NOTE — Patient Instructions (Addendum)
IONTOPHORESIS PATIENT PRECAUTIONS & CONTRAINDICATIONS:  . Redness under one or both electrodes can occur.  This characterized by a uniform redness that usually disappears within 12 hours of treatment. . Small pinhead size blisters may result in response to the drug.  Contact your physician if the problem persists more than 24 hours. . On rare occasions, iontophoresis therapy can result in temporary skin reactions such as rash, inflammation, irritation or burns.  The skin reactions may be the result of individual sensitivity to the ionic solution used, the condition of the skin at the start of treatment, reaction to the materials in the electrodes, allergies or sensitivity to dexamethasone, or a poor connection between the patch and your skin.  Discontinue using iontophoresis if you have any of these reactions and report to your therapist. . Remove the Patch or electrodes if you have any undue sensation of pain or burning during the treatment and report discomfort to your therapist. . Tell your Therapist if you have had known adverse reactions to the application of electrical current. . Approximate treatment time is 4-6 hours.  Remove the patch after 6 hours. . The Patch can be worn during normal activity, however excessive motion where the electrodes have been placed can cause poor contact between the skin and the electrode or uneven electrical current resulting in greater risk of skin irritation. . Keep out of the reach of children.   . DO NOT use if you have a cardiac pacemaker or any other electrically sensitive implanted device. . DO NOT use if you have a known sensitivity to dexamethasone. . DO NOT use during Magnetic Resonance Imaging (MRI). . DO NOT use over broken or compromised skin (e.g. sunburn, cuts, or acne) due to the increased risk of skin reaction. . DO NOT SHAVE over the area to be treated:  To establish good contact between the Patch and the skin, excessive hair may be  clipped. . DO NOT place the Patch or electrodes on or over your eyes, directly over your heart, or brain. . DO NOT reuse the Patch or electrodes as this may cause burns to occur.  

## 2018-10-02 ENCOUNTER — Ambulatory Visit: Payer: Medicaid Other | Admitting: Physical Therapy

## 2018-10-02 ENCOUNTER — Encounter: Payer: Self-pay | Admitting: Physical Therapy

## 2018-10-02 DIAGNOSIS — M6281 Muscle weakness (generalized): Secondary | ICD-10-CM

## 2018-10-02 DIAGNOSIS — M25511 Pain in right shoulder: Secondary | ICD-10-CM

## 2018-10-02 DIAGNOSIS — M25611 Stiffness of right shoulder, not elsewhere classified: Secondary | ICD-10-CM

## 2018-10-02 DIAGNOSIS — R293 Abnormal posture: Secondary | ICD-10-CM

## 2018-10-02 NOTE — Therapy (Addendum)
[facility] [address]  [place], [place], [address] Phone: [phone]   Fax:  [phone]  Physical Therapy Treatment / Discharge Summary  Patient Details  Name: [name] MRN: [id number] Date of Birth: 12-Oct-1971 Referring Provider (PT): Gary Fleet, [place]   Encounter Date: 10/02/2018  PT End of Session - 10/02/18 0808    Visit Number  3    Number of Visits  16    Date for PT Re-Evaluation  11/13/18    Authorization Type  Medicaid    Authorization Time Period  09/21/18 -10/11/18    Authorization - Visit Number  2    Authorization - Number of Visits  3    PT Start Time  0808   Pt arrived late   PT Stop Time  0904    PT Time Calculation (min)  56 min    Activity Tolerance  Patient limited by pain    Behavior During Therapy  [facility] for tasks assessed/performed       Past Medical History:  Diagnosis Date  . Breast cancer ([place])   . Hypertension   . Migraine   . UTI (lower urinary tract infection)     Past Surgical History:  Procedure Laterality Date  . BREAST SURGERY    . CHOLECYSTECTOMY    . ROTATOR CUFF REPAIR      There were no vitals filed for this visit.  Subjective Assessment - 10/02/18 0810    Subjective  Pt not noting any benefit from ionto patch, "but not sure if it would have hurt worse w/o the patch". Reports she received an injection in the R arm yesterday that she choose to have in the R arm because the arm typically hurts for a week after the injection and she needed to have a good arm.    Limitations  House hold activities    Diagnostic tests  R shoulder x-ray 08/28/18: Minimal degenerative change of the [facility] joint and glenohumeral joints.    Patient Stated Goals  "back to normal"    Currently in Pain?  Yes    Pain Score  5     Pain Location  Shoulder    Pain Orientation  Right    Pain Descriptors / Indicators  Aching    Pain Type  Acute pain    Pain Frequency  Constant          [facility] PT Assessment - 10/02/18 0808      Assessment   Next MD Visit  TBD once MRI completed                   [facility] [place] [facility] Adult PT Treatment/Exercise - 10/02/18 0808      Exercises   Exercises  Shoulder      Shoulder Exercises: Seated   Flexion  Right;AAROM;10 reps    Flexion Limitations  table slides    Abduction  Right;AAROM;10 reps    ABduction Limitations  table slides - more limited tolerance/ROM    Diagonals  Right;AAROM;10 reps    Diagonals Limitations  scaption table slides      Shoulder Exercises: Standing   Extension  Both;15 reps    Theraband Level (Shoulder Extension)  Level 1 (Yellow)    Extension Limitations  cues for scap retraction & depression with shoulder extension to neutral    Row  Both;15 reps;Theraband;Strengthening    Theraband Level (Shoulder Row)  Level 1 (Yellow)    Row Limitations  cues  for scap retraction & depression      Shoulder Exercises: Pulleys   Flexion  2 minutes    Flexion Limitations  limited tolerance - poor ROM    Scaption  2 minutes    Scaption Limitations  limited tolerance - poor ROM      Shoulder Exercises: ROM/Strengthening   Pendulum  R shoulder - fwd/back, side to side & CW/CCW x 2 min each      Modalities   Modalities  Electrical Stimulation;Cryotherapy      Cryotherapy   Number Minutes Cryotherapy  15 Minutes    Cryotherapy Location  Shoulder   Rt   Type of Cryotherapy  Ice pack      Electrical Stimulation   Electrical Stimulation Location  R shoulder complex    Electrical Stimulation Action  IFC    Electrical Stimulation Parameters  80-150 Hz, intensity to pt tol x15'    Electrical Stimulation Goals  Pain             PT Education - 10/02/18 0844    Education Details  HEP update - table slides    Person(s) Educated  Patient    Methods  Explanation;Demonstration;Handout    Comprehension  Verbalized understanding;Returned demonstration;Need further instruction       PT Short Term Goals -  09/25/18 0817      PT SHORT TERM GOAL #1   Title  Independent with initial HEP    Status  On-going      PT SHORT TERM GOAL #2   Title  Patient to demonstrate appropriate posture and body mechanics to reduce risk of impingment with daily activities    Status  On-going      PT SHORT TERM GOAL #3   Title  R shoulder flexion & abduction AROM >/= 90 dg w/o increased pain    Status  On-going        PT Long Term Goals - 09/25/18 0817      PT LONG TERM GOAL #1   Title  Independent with ongoing/advanced HEP    Status  On-going      PT LONG TERM GOAL #2   Title  R shoulder AROM WFL w/o pain provocation    Status  On-going      PT LONG TERM GOAL #3   Title  R shoulder strength >/= 4/5 for improved functional use of R UE    Status  On-going      PT LONG TERM GOAL #4   Title  Patient to report ability to perform ADLs and household chores without limitaiton due to R shoulder pain    Status  On-going            Plan - 10/02/18 0814    Clinical Impression Statement  [name] reporting no increased pain or issues with most recent HEP addition, however pain worse today s pt received unrelated injection in R arm yesterday that she was told would likely cause her arm to hurt for ~1 week. Review of exercises only requiring cueing to increase amplitude of pendulums and keep elbows at side of ribs with theraband rows. Therapy tolerance remains limited due to pain and manual tolerance even more limited due to increased pain from injection, but able to tolerate table slides with gradually increasing motion therefore provided instructions for HEP. Treatment concluded with estim and ice for pain management - pt reporting she is still looking into home TENS unit and plans to check if insurance will cover the cost  of the unit.    Rehab Potential  Good    PT Frequency  --   1x/wk for initial Medicaid authorizartion, then 2x/wk for duration of POC   PT Treatment/Interventions  Patient/family  education;Manual techniques;Passive range of motion;Taping;Dry needling;Electrical Stimulation;Cryotherapy;Moist Heat;Ultrasound;Iontophoresis 53m/ml Dexamethasone;Therapeutic exercise;Therapeutic activities;Neuromuscular re-education;ADLs/Self Care Home Management;Vasopneumatic Device    PT Next Visit Plan  Medicaid reauthorization; Review updated HEP; gently progress scapular strengthening & R shoulder P/AA/AROM; Manual therapy & modalities PRN for pain    PT Home Exercise Plan  Scap retraction; wand AAROM; pendulums; therband rows (yellow TB)    Consulted and Agree with Plan of Care  Patient       Patient will benefit from skilled therapeutic intervention in order to improve the following deficits and impairments:  Pain, Increased muscle spasms, Hypomobility, Decreased range of motion, Decreased strength, Postural dysfunction, Improper body mechanics, Impaired UE functional use, Decreased activity tolerance  Visit Diagnosis: Acute pain of right shoulder  Stiffness of right shoulder, not elsewhere classified  Abnormal posture  Muscle weakness (generalized)     Problem List Patient Active Problem List   Diagnosis Date Noted  . Migraine 12/02/2013    JPercival Spanish PT, MPT 10/02/2018, 12:02 PM  [facility] [place] [address]Phone: [phone]  Fax:  [phone] Name: Annette RIEDLINGERMRN: [id number] of Birth: 810-13-1972 PHYSICAL THERAPY DISCHARGE SUMMARY  Visits from Start of Care: 3  Current functional level related to goals / functional outcomes:   Refer to above clinical impression for status as of last visit on 10/02/18. Pt was a no show for last remaining visit in initial Medicaid authorization period, therefore unable to reassess status or request additional Medicaid authorization. As such, will proceed with discharge from PT for this episode.   Remaining deficits:    As above.   Education / Equipment:   HEP  Plan: Patient agrees to discharge.  Patient goals were not met. Patient is being discharged due to not returning since the last visit.  ?????    JPercival Spanish PT, MPT 10/29/18, 5:15 PM  [facility] [place] [address]Phone: [phone]  Fax:  [phone]

## 2018-10-09 ENCOUNTER — Encounter: Payer: Self-pay | Admitting: Physical Therapy

## 2018-10-31 ENCOUNTER — Other Ambulatory Visit: Payer: Self-pay

## 2018-10-31 ENCOUNTER — Emergency Department (HOSPITAL_BASED_OUTPATIENT_CLINIC_OR_DEPARTMENT_OTHER)
Admission: EM | Admit: 2018-10-31 | Discharge: 2018-10-31 | Disposition: A | Discharge status: Home / Self Care | Payer: Medicaid Other | Attending: Emergency Medicine | Admitting: Emergency Medicine

## 2018-10-31 ENCOUNTER — Encounter (HOSPITAL_BASED_OUTPATIENT_CLINIC_OR_DEPARTMENT_OTHER): Payer: Self-pay | Admitting: Emergency Medicine

## 2018-10-31 DIAGNOSIS — Z853 Personal history of malignant neoplasm of breast: Secondary | ICD-10-CM | POA: Diagnosis not present

## 2018-10-31 DIAGNOSIS — J111 Influenza due to unidentified influenza virus with other respiratory manifestations: Secondary | ICD-10-CM

## 2018-10-31 DIAGNOSIS — J101 Influenza due to other identified influenza virus with other respiratory manifestations: Secondary | ICD-10-CM | POA: Insufficient documentation

## 2018-10-31 DIAGNOSIS — I10 Essential (primary) hypertension: Secondary | ICD-10-CM | POA: Insufficient documentation

## 2018-10-31 DIAGNOSIS — J029 Acute pharyngitis, unspecified: Secondary | ICD-10-CM | POA: Diagnosis present

## 2018-10-31 DIAGNOSIS — Z79899 Other long term (current) drug therapy: Secondary | ICD-10-CM | POA: Diagnosis not present

## 2018-10-31 MED ORDER — LIDOCAINE VISCOUS HCL 2 % MT SOLN: 15.0000 mL | Freq: Four times a day (QID) | OROMUCOSAL | 0 refills | Status: DC | PRN

## 2018-10-31 MED ORDER — OSELTAMIVIR PHOSPHATE 75 MG PO CAPS: 75.0000 mg | ORAL_CAPSULE | Freq: Two times a day (BID) | ORAL | 0 refills | Status: DC

## 2018-10-31 NOTE — ED Provider Notes (Signed)
[facility] EMERGENCY DEPARTMENT Provider Note   CSN: [id number] Arrival date & time: 10/31/18  1158     History   Chief Complaint Chief Complaint  Patient presents with  . Sore Throat    HPI [name] is a 48 y.o. female.  HPI  48 year old female presents with sore throat.  Started yesterday.  Has also had a high fever over 102 and diffuse body aches.  She states she has a little bit of a headache but no rhinorrhea, congestion, or significant cough.  No shortness of breath or vomiting.  No abdominal pain or urinary symptoms.  Has taken naproxen, ibuprofen, and Tylenol.  Did not receive a flu vaccine this year.  She denies any current medical problems or current cancer/immunotherapy.  Past Medical History:  Diagnosis Date  . Breast cancer ([place])   . Hypertension   . Migraine   . UTI (lower urinary tract infection)     Patient Active Problem List   Diagnosis Date Noted  . Migraine 12/02/2013    Past Surgical History:  Procedure Laterality Date  . BREAST SURGERY    . CHOLECYSTECTOMY    . ROTATOR CUFF REPAIR       OB History   No obstetric history on file.      Home Medications    Prior to Admission medications   Medication Sig Start Date End Date Taking? Authorizing Provider  cyclobenzaprine (FLEXERIL) 10 MG tablet Take 1 tablet (10 mg total) by mouth 3 (three) times daily as needed for muscle spasms. 08/28/18   Sherwood Gambler, MD  HYDROcodone-acetaminophen (NORCO/VICODIN) 5-325 MG tablet Take 2 tablets by mouth every 4 (four) hours as needed. Patient not taking: Reported on 09/09/2018 01/05/17   Recardo Evangelist, PA-C  ibuprofen (ADVIL,MOTRIN) 600 MG tablet Take 1 tablet (600 mg total) by mouth every 6 (six) hours as needed. Patient not taking: Reported on 09/09/2018 01/05/17   Recardo Evangelist, PA-C  lidocaine (XYLOCAINE) 2 % solution Use as directed 15 mLs in the mouth or throat every 6 (six) hours as needed for mouth pain. 10/31/18    Sherwood Gambler, MD  methocarbamol (ROBAXIN) 500 MG tablet Take 1 tablet (500 mg total) by mouth at bedtime and may repeat dose one time if needed. Patient not taking: Reported on 09/09/2018 01/05/17   Recardo Evangelist, PA-C  ondansetron (ZOFRAN ODT) 4 MG disintegrating tablet Take 1 tablet (4 mg total) by mouth every 8 (eight) hours as needed for nausea or vomiting. Patient not taking: Reported on 09/09/2018 07/27/18   Duffy Bruce, MD  oseltamivir (TAMIFLU) 75 MG capsule Take 1 capsule (75 mg total) by mouth every 12 (twelve) hours. 10/31/18   Sherwood Gambler, MD  Topiramate (TOPAMAX PO) Take by mouth.    [provider]    Family History History reviewed. No pertinent family history.  Social History Social History   Tobacco Use  . Smoking status: Never Smoker  . Smokeless tobacco: Never Used  Substance Use Topics  . Alcohol use: No  . Drug use: No     Allergies   Midazolam hcl   Review of Systems Review of Systems  Constitutional: Positive for fever.  HENT: Positive for sore throat.   Respiratory: Negative for cough and shortness of breath.   Cardiovascular: Negative for chest pain.  Gastrointestinal: Negative for abdominal pain and vomiting.  Genitourinary: Negative for dysuria.  Musculoskeletal: Positive for myalgias.  Neurological: Positive for headaches.  All other systems reviewed and are  negative.    Physical Exam Updated Vital Signs BP (!) 123/91 (BP Location: Right Arm)   Pulse (!) 102   Temp 98.7 F (37.1 C) (Oral)   Resp 18   Ht 5\' 4"  (1.626 m)   Wt 58.1 kg   SpO2 100%   BMI 21.97 kg/m   Physical Exam Vitals signs and nursing note reviewed.  Constitutional:      Appearance: She is well-developed.  HENT:     Head: Normocephalic and atraumatic.     Right Ear: External ear normal.     Left Ear: External ear normal.     Nose: Nose normal.     Mouth/Throat:     Pharynx: Uvula midline. Posterior oropharyngeal erythema present. No  pharyngeal swelling or uvula swelling.     Tonsils: No tonsillar exudate.  Eyes:     General:        Right eye: No discharge.        Left eye: No discharge.  Neck:     Musculoskeletal: Normal range of motion and neck supple. No neck rigidity.  Cardiovascular:     Rate and Rhythm: Normal rate and regular rhythm.     Heart sounds: Normal heart sounds.     Comments: HR~100 Pulmonary:     Effort: Pulmonary effort is normal.     Breath sounds: Normal breath sounds.  Abdominal:     Palpations: Abdomen is soft.     Tenderness: There is no abdominal tenderness.  Lymphadenopathy:     Cervical: No cervical adenopathy.  Skin:    General: Skin is warm and dry.  Neurological:     Mental Status: She is alert.  Psychiatric:        Mood and Affect: Mood is not anxious.      ED Treatments / Results  Labs (all labs ordered are listed, but only abnormal results are displayed) Labs Reviewed - No data to display  EKG None  Radiology No results found.  Procedures Procedures (including critical care time)  Medications Ordered in ED Medications - No data to display   Initial Impression / Assessment and Plan / ED Course  I have reviewed the triage vital signs and the nursing notes.  Pertinent labs & imaging results that were available during my care of the patient were reviewed by me and considered in my medical decision making (see chart for details).     Presentation is clinically consistent with influenza.  I discussed the risk/benefits of Tamiflu and she wants to start this.  Given her symptoms started within 48 hours I think this is reasonable though I cautioned on potential side effects and other limitations.  She also would like something for sore throat and will be prescribed viscous lidocaine.  She overall appears well and her heart rate is very minimally tachycardic but she is afebrile, no increased work of breathing, and no shortness of breath.  Highly doubt pneumonia with  clear lungs.  At this point she does not appear to have a bacterial illness and I have very low suspicion for meningitis.  We discussed symptomatic care and return precautions.  We also discussed taking only 1 of the NSAIDs in addition to Tylenol.  Final Clinical Impressions(s) / ED Diagnoses   Final diagnoses:  Influenza    ED Discharge Orders         Ordered    oseltamivir (TAMIFLU) 75 MG capsule  Every 12 hours     10/31/18 1256    lidocaine (  XYLOCAINE) 2 % solution  Every 6 hours PRN     10/31/18 1257           Sherwood Gambler, MD 10/31/18 1300

## 2018-10-31 NOTE — ED Triage Notes (Addendum)
Reports sore throat with fever and generalized body aches x 2 days.  Reports took 500mg  naproxen and 600mg  ibuprofen this morning.

## 2018-10-31 NOTE — Discharge Instructions (Addendum)
Your symptoms are consistent with influenza.  You are contagious at this time and need to stay away from other people, especially elderly and children/infants or chronically ill people.    Otherwise, take the medicines as prescribed and do not deviate from the instructions.    If you develop high fever or uncontrolled fever, shortness of breath, neck stiffness, or any other new/concerning symptoms then return to the ER for evaluation.

## 2019-07-16 ENCOUNTER — Encounter (HOSPITAL_BASED_OUTPATIENT_CLINIC_OR_DEPARTMENT_OTHER): Payer: Self-pay | Admitting: Emergency Medicine

## 2019-07-16 ENCOUNTER — Emergency Department (HOSPITAL_BASED_OUTPATIENT_CLINIC_OR_DEPARTMENT_OTHER)
Admission: EM | Admit: 2019-07-16 | Discharge: 2019-07-16 | Disposition: A | Discharge status: Home / Self Care | Payer: Managed Care, Other (non HMO) | Attending: Emergency Medicine | Admitting: Emergency Medicine

## 2019-07-16 ENCOUNTER — Other Ambulatory Visit: Payer: Self-pay

## 2019-07-16 DIAGNOSIS — I10 Essential (primary) hypertension: Secondary | ICD-10-CM | POA: Insufficient documentation

## 2019-07-16 DIAGNOSIS — G43009 Migraine without aura, not intractable, without status migrainosus: Secondary | ICD-10-CM | POA: Insufficient documentation

## 2019-07-16 DIAGNOSIS — Z79899 Other long term (current) drug therapy: Secondary | ICD-10-CM | POA: Diagnosis not present

## 2019-07-16 DIAGNOSIS — R51 Headache: Secondary | ICD-10-CM | POA: Diagnosis present

## 2019-07-16 MED ORDER — KETOROLAC TROMETHAMINE 15 MG/ML IJ SOLN
15.0000 mg | Freq: Once | INTRAMUSCULAR | Status: AC
  Administered 2019-07-16: 10:00:00 15 mg via INTRAVENOUS
  Filled 2019-07-16: qty 1

## 2019-07-16 MED ORDER — METOCLOPRAMIDE HCL 5 MG/ML IJ SOLN
10.0000 mg | Freq: Once | INTRAMUSCULAR | Status: AC
  Administered 2019-07-16: 10 mg via INTRAVENOUS
  Filled 2019-07-16: qty 2

## 2019-07-16 MED ORDER — DIPHENHYDRAMINE HCL 50 MG/ML IJ SOLN
25.0000 mg | Freq: Once | INTRAMUSCULAR | Status: AC
  Administered 2019-07-16: 25 mg via INTRAVENOUS
  Filled 2019-07-16: qty 1

## 2019-07-16 MED ORDER — SODIUM CHLORIDE 0.9 % IV BOLUS
1000.0000 mL | Freq: Once | INTRAVENOUS | Status: AC
  Administered 2019-07-16: 1000 mL via INTRAVENOUS

## 2019-07-16 NOTE — ED Triage Notes (Signed)
Pt was here two days ago with Headache, came back yesterday.  Some nausea, vomiting this am.  No fever.  No changes in vision. No weakness.

## 2019-07-16 NOTE — ED Provider Notes (Signed)
[facility] EMERGENCY DEPARTMENT Provider Note   CSN: [id number] Arrival date & time: 07/16/19  0919     History   Chief Complaint Headache  HPI [name] is a 48 y.o. female history of severe migraines, chronic intractable migraines for which she has seen neurology, on Topamax daily, presented to the emergency department with a migraine.  She reports that her headache began yesterday.  It was gradual onset and feels exactly like her typical migraines.  She gets a migraine every couple of days.  She states she cannot tolerate any light.  Her headache is severe and diffuse.  Associated with nausea.  She denies any falls, denies any trauma to the head, denies any blood thinner use.  She reports allergies to Versed.  In the past she has had imaging including MRI of the brain and CT scan of the brain which not showing any acute abnormalities.  She says she has not followed up with a neurologist in some time because she has been unable to get an.  She denies fevers, sick contacts, neck stiffness, rash.  She denies any cough or shortness of breath.   HPI  Past Medical History:  Diagnosis Date  . Breast cancer ([place])   . Hypertension   . Migraine   . UTI (lower urinary tract infection)     Patient Active Problem List   Diagnosis Date Noted  . Migraine 12/02/2013    Past Surgical History:  Procedure Laterality Date  . BREAST SURGERY    . CHOLECYSTECTOMY    . ROTATOR CUFF REPAIR       OB History   No obstetric history on file.      Home Medications    Prior to Admission medications   Medication Sig Start Date End Date Taking? Authorizing Provider  cyclobenzaprine (FLEXERIL) 5 MG tablet TK 1 TO 2 TS PO Q 8 H PRF MUSCLE SPASM 06/01/19   [provider]  ibuprofen (ADVIL) 800 MG tablet TK 1 T PO Q 6 H PRN 03/31/19   [provider]  topiramate (TOPAMAX) 50 MG tablet Topamax 50 mg tablet  Take 1 tablet twice a day by oral route.    [provider]    Family History No family history on file.  Social History Social History   Tobacco Use  . Smoking status: Never Smoker  . Smokeless tobacco: Never Used  Substance Use Topics  . Alcohol use: No  . Drug use: No     Allergies   Midazolam and Midazolam hcl   Review of Systems Review of Systems  Constitutional: Negative for chills and fever.  Eyes: Positive for visual disturbance. Negative for pain.  Respiratory: Negative for cough and shortness of breath.   Cardiovascular: Negative for chest pain and palpitations.  Gastrointestinal: Positive for nausea. Negative for vomiting.  Musculoskeletal: Negative for neck pain and neck stiffness.  Skin: Negative for pallor and rash.  Neurological: Positive for light-headedness and headaches. Negative for seizures and syncope.  Psychiatric/Behavioral: Negative for agitation and confusion.  All other systems reviewed and are negative.    Physical Exam Updated Vital Signs BP (!) 149/109 (BP Location: Left Arm)   Pulse 87   Temp 98.2 F (36.8 C) (Oral)   Resp 20   Ht 5\' 1"  (1.549 m)   Wt 63.5 kg   LMP 07/10/2019   SpO2 100%   BMI 26.45 kg/m   Physical Exam Vitals signs and nursing note reviewed.  Constitutional:  General: She is in acute distress.     Appearance: She is well-developed.     Comments: Crying on exam, wearing sunglasses  HENT:     Head: Normocephalic and atraumatic.  Eyes:     Conjunctiva/sclera: Conjunctivae normal.     Comments: PERRL Wearing contacts  Neck:     Musculoskeletal: Normal range of motion and neck supple. No neck rigidity.  Cardiovascular:     Rate and Rhythm: Regular rhythm. Tachycardia present.     Heart sounds: Normal heart sounds.  Pulmonary:     Effort: Pulmonary effort is normal. No respiratory distress.  Abdominal:     Palpations: Abdomen is soft.     Tenderness: There is no abdominal tenderness.  Skin:    General: Skin is warm and dry.  Neurological:      Mental Status: She is alert and oriented to person, place, and time.     GCS: GCS eye subscore is 4. GCS verbal subscore is 5. GCS motor subscore is 6.     Sensory: No sensory deficit.     Motor: No weakness.      ED Treatments / Results  Labs (all labs ordered are listed, but only abnormal results are displayed) Labs Reviewed - No data to display  EKG None  Radiology No results found.  Procedures Procedures (including critical care time)  Medications Ordered in ED Medications  metoCLOPramide (REGLAN) injection 10 mg (10 mg Intravenous Given 07/16/19 0953)  diphenhydrAMINE (BENADRYL) injection 25 mg (25 mg Intravenous Given 07/16/19 0953)  ketorolac (TORADOL) 15 MG/ML injection 15 mg (15 mg Intravenous Given 07/16/19 0954)  sodium chloride 0.9 % bolus 1,000 mL (0 mLs Intravenous Stopped 07/16/19 1141)     Initial Impression / Assessment and Plan / ED Course  I have reviewed the triage vital signs and the nursing notes.  Pertinent labs & imaging results that were available during my care of the patient were reviewed by me and considered in my medical decision making (see chart for details).  Patient is 48 year old female presenting to the emergency department with acute on chronic migraine.  Unfortunately this appears to be a recurring issue for her, she has severe and often intractable migraines.  She is on Topamax which she has been taking daily without missing any doses.  She presents today because she cannot manage her pain at home.  Based on her clinical presentation and her prior history and her description, I suspect strongly that this is another flareup of her typical migraine.  I have a much lower suspicion for meningitis based on her clinical exam.  She is afebrile here.  I likewise have a low suspicion for an intracranial bleed at this time.  She also has no focal neurological deficits on my exam.  I do not believe she needs a stat CT scan of her brain.  We will treat  with an IV cocktail for migraine.  So include Reglan, Benadryl, Toradol, and a normal saline bolus.  Will reassess.   Clinical Course as of Jul 15 1814  Fri Jul 16, 2019  1147 Patient is feeling substantially better after medications.  Ready for discharge.   [MT]    Clinical Course User Index [MT] Langston Masker Carola Rhine, MD    Final Clinical Impressions(s) / ED Diagnoses   Final diagnoses:  Migraine without aura and without status migrainosus, not intractable    ED Discharge Orders    None       Destry Dauber, Carola Rhine, MD 07/16/19  1815  

## 2019-08-11 ENCOUNTER — Other Ambulatory Visit: Payer: Self-pay

## 2019-08-11 ENCOUNTER — Emergency Department (HOSPITAL_BASED_OUTPATIENT_CLINIC_OR_DEPARTMENT_OTHER)
Admission: EM | Admit: 2019-08-11 | Discharge: 2019-08-11 | Disposition: A | Discharge status: Home / Self Care | Payer: Managed Care, Other (non HMO) | Attending: Emergency Medicine | Admitting: Emergency Medicine

## 2019-08-11 ENCOUNTER — Encounter (HOSPITAL_BASED_OUTPATIENT_CLINIC_OR_DEPARTMENT_OTHER): Payer: Self-pay

## 2019-08-11 DIAGNOSIS — Z79899 Other long term (current) drug therapy: Secondary | ICD-10-CM | POA: Insufficient documentation

## 2019-08-11 DIAGNOSIS — N644 Mastodynia: Secondary | ICD-10-CM | POA: Diagnosis not present

## 2019-08-11 DIAGNOSIS — I10 Essential (primary) hypertension: Secondary | ICD-10-CM | POA: Diagnosis not present

## 2019-08-11 MED ORDER — DICLOFENAC SODIUM 1 % TD GEL: 2.0000 g | Freq: Four times a day (QID) | TRANSDERMAL | 0 refills | Status: DC

## 2019-08-11 NOTE — ED Triage Notes (Signed)
Pt c/o painful mass to right breast x 2 days-NAD-steady gait

## 2019-08-11 NOTE — Discharge Instructions (Addendum)
The best evaluation for breast pain is a mammogram. You may reach out to the breast center listed below for follow-up with your OB/GYN to set this up. Use Voltaren gel to help with pain in the meantime. Return to the emergency room if you develop high fevers, the area becomes red, or if any new, worsening, or concerning symptoms.

## 2019-08-11 NOTE — ED Provider Notes (Signed)
[facility] EMERGENCY DEPARTMENT Provider Note   CSN: [id number] Arrival date & time: 08/11/19  1845     History   Chief Complaint Chief Complaint  Patient presents with  . Breast Mass    HPI [name] is a 48 y.o. female presenting for evaluation of right-sided breast pain.  Patient states 2 days ago she started to have tenderness at the 3:00 area of her right breast.  Patient feels the skin is slightly firmer there than on the left side.  Pain has been constant for the past 2 days.  She denies nipple drainage or abnormality.  Last period was 1 month ago.  Patient states she has a history of breast cancer.  It was diagnosed 4 years ago on her left side.  She is status post lumpectomy.  She did not go undergo radiation or chemo at that time.  Patient states she has no other medical problems, takes no medications daily.     HPI  Past Medical History:  Diagnosis Date  . Breast cancer ([place])   . Hypertension   . Migraine   . UTI (lower urinary tract infection)     Patient Active Problem List   Diagnosis Date Noted  . Migraine 12/02/2013    Past Surgical History:  Procedure Laterality Date  . BREAST SURGERY    . CHOLECYSTECTOMY    . ROTATOR CUFF REPAIR       OB History   No obstetric history on file.      Home Medications    Prior to Admission medications   Medication Sig Start Date End Date Taking? Authorizing Provider  topiramate (TOPAMAX) 50 MG tablet Topamax 50 mg tablet  Take 1 tablet twice a day by oral route.   Yes [provider]  diclofenac sodium (VOLTAREN) 1 % GEL Apply 2 g topically 4 (four) times daily. 08/11/19   Darielle Hancher, PA-C    Family History No family history on file.  Social History Social History   Tobacco Use  . Smoking status: Never Smoker  . Smokeless tobacco: Never Used  Substance Use Topics  . Alcohol use: No  . Drug use: No     Allergies   Midazolam and Midazolam hcl   Review of  Systems Review of Systems  Skin:       R breast pain     Physical Exam Updated Vital Signs BP (!) 161/102 (BP Location: Left Arm)   Pulse 88   Temp 98.3 F (36.8 C) (Oral)   Resp 20   Ht 5\' 1"  (1.549 m)   Wt 64 kg   LMP 07/11/2019   SpO2 100%   BMI 26.64 kg/m   Physical Exam Vitals signs and nursing note reviewed.  Constitutional:      General: She is not in acute distress.    Appearance: She is well-developed.     Comments: Patient appears anxious, but otherwise nontoxic  HENT:     Head: Normocephalic and atraumatic.  Neck:     Musculoskeletal: Normal range of motion.  Pulmonary:     Effort: Pulmonary effort is normal.  Chest:       Comments: Tenderness of the right breast at the 3 o'clock position.  No erythema, warmth, or induration.  No palpable nodules or masses.  No abnormality of the nipple noted nipple discharge. Abdominal:     General: There is no distension.  Musculoskeletal: Normal range of motion.  Skin:    General: Skin is warm.  Findings: No rash.  Neurological:     Mental Status: She is alert and oriented to person, place, and time.      ED Treatments / Results  Labs (all labs ordered are listed, but only abnormal results are displayed) Labs Reviewed - No data to display  EKG None  Radiology No results found.  Procedures Procedures (including critical care time)  Medications Ordered in ED Medications - No data to display   Initial Impression / Assessment and Plan / ED Course  I have reviewed the triage vital signs and the nursing notes.  Pertinent labs & imaging results that were available during my care of the patient were reviewed by me and considered in my medical decision making (see chart for details).        Patient presenting for evaluation of right-sided breast pain.  Physical exam reassuring, she appears nontoxic.  No sign of infection.  No palpable mass or nodule.  I have low suspicion for breast cancer at this  time as there is only tenderness.  Likely hormone related.  However due to her history, recommend follow-up at the breast center for mammography.  Patient to follow-up with her OB/GYN if she is having difficulty with scheduling.  At this time, patient appears safe for discharge.  Return precautions given.  Patient states she understands and agrees to plan.  Final Clinical Impressions(s) / ED Diagnoses   Final diagnoses:  Breast pain, right    ED Discharge Orders         Ordered    diclofenac sodium (VOLTAREN) 1 % GEL  4 times daily     08/11/19 2018           Franchot Heidelberg, PA-C 08/11/19 2350    Virgel Manifold, MD 08/13/19 1007

## 2020-04-04 ENCOUNTER — Emergency Department (HOSPITAL_BASED_OUTPATIENT_CLINIC_OR_DEPARTMENT_OTHER)
Admission: EM | Admit: 2020-04-04 | Discharge: 2020-04-04 | Disposition: A | Discharge status: Home / Self Care | Payer: Medicaid Other | Attending: Emergency Medicine | Admitting: Emergency Medicine

## 2020-04-04 ENCOUNTER — Encounter (HOSPITAL_BASED_OUTPATIENT_CLINIC_OR_DEPARTMENT_OTHER): Payer: Self-pay

## 2020-04-04 ENCOUNTER — Other Ambulatory Visit: Payer: Self-pay

## 2020-04-04 DIAGNOSIS — R1084 Generalized abdominal pain: Secondary | ICD-10-CM | POA: Diagnosis present

## 2020-04-04 DIAGNOSIS — R197 Diarrhea, unspecified: Secondary | ICD-10-CM | POA: Insufficient documentation

## 2020-04-04 DIAGNOSIS — G43809 Other migraine, not intractable, without status migrainosus: Secondary | ICD-10-CM | POA: Diagnosis not present

## 2020-04-04 DIAGNOSIS — R112 Nausea with vomiting, unspecified: Secondary | ICD-10-CM | POA: Diagnosis not present

## 2020-04-04 LAB — URINALYSIS, MICROSCOPIC (REFLEX)

## 2020-04-04 LAB — COMPREHENSIVE METABOLIC PANEL
ALT: 47 U/L — ABNORMAL HIGH (ref 0–44)
AST: 52 U/L — ABNORMAL HIGH (ref 15–41)
Albumin: 4.5 g/dL (ref 3.5–5.0)
Alkaline Phosphatase: 73 U/L (ref 38–126)
Anion gap: 14 (ref 5–15)
BUN: 15 mg/dL (ref 6–20)
CO2: 27 mmol/L (ref 22–32)
Calcium: 8.4 mg/dL — ABNORMAL LOW (ref 8.9–10.3)
Chloride: 96 mmol/L — ABNORMAL LOW (ref 98–111)
Creatinine, Ser: 1.01 mg/dL — ABNORMAL HIGH (ref 0.44–1.00)
GFR calc Af Amer: >60 mL/min (ref >60)
GFR calc non Af Amer: >60 mL/min (ref >60)
Glucose, Bld: 104 mg/dL — ABNORMAL HIGH (ref 70–99)
Potassium: 3.2 mmol/L — ABNORMAL LOW (ref 3.5–5.1)
Sodium: 137 mmol/L (ref 135–145)
Total Bilirubin: 1.5 mg/dL — ABNORMAL HIGH (ref 0.3–1.2)
Total Protein: 7.6 g/dL (ref 6.5–8.1)

## 2020-04-04 LAB — PREGNANCY, URINE: Preg Test, Ur: NEGATIVE

## 2020-04-04 LAB — URINALYSIS, ROUTINE W REFLEX MICROSCOPIC
Bilirubin Urine: NEGATIVE
Glucose, UA: NEGATIVE mg/dL
Hgb urine dipstick: NEGATIVE
Ketones, ur: NEGATIVE mg/dL
Leukocytes,Ua: NEGATIVE
Nitrite: NEGATIVE
Protein, ur: 30 mg/dL — AB
Specific Gravity, Urine: 1.015 (ref 1.005–1.030)
pH: 7 (ref 5.0–8.0)

## 2020-04-04 LAB — LIPASE, BLOOD: Lipase: 21 U/L (ref 11–51)

## 2020-04-04 LAB — CBC WITH DIFFERENTIAL/PLATELET
Abs Immature Granulocytes: 0.02 10*3/uL (ref 0.00–0.07)
Basophils Absolute: 0.1 10*3/uL (ref 0.0–0.1)
Basophils Relative: 1 %
Eosinophils Absolute: 0.2 10*3/uL (ref 0.0–0.5)
Eosinophils Relative: 2 %
HCT: 38.1 % (ref 36.0–46.0)
Hemoglobin: 13.1 g/dL (ref 12.0–15.0)
Immature Granulocytes: 0 %
Lymphocytes Relative: 43 %
Lymphs Abs: 3.3 10*3/uL (ref 0.7–4.0)
MCH: 29.9 pg (ref 26.0–34.0)
MCHC: 34.4 g/dL (ref 30.0–36.0)
MCV: 87 fL (ref 80.0–100.0)
Monocytes Absolute: 0.6 10*3/uL (ref 0.1–1.0)
Monocytes Relative: 8 %
Neutro Abs: 3.6 10*3/uL (ref 1.7–7.7)
Neutrophils Relative %: 46 %
Platelets: 327 10*3/uL (ref 150–400)
RBC: 4.38 MIL/uL (ref 3.87–5.11)
RDW: 12.4 % (ref 11.5–15.5)
WBC: 7.7 10*3/uL (ref 4.0–10.5)
nRBC: 0 % (ref 0.0–0.2)

## 2020-04-04 MED ORDER — ONDANSETRON 4 MG PO TBDP: 4.0000 mg | ORAL_TABLET | Freq: Three times a day (TID) | ORAL | 0 refills | Status: DC | PRN

## 2020-04-04 MED ORDER — POTASSIUM CHLORIDE CRYS ER 20 MEQ PO TBCR: 40.0000 meq | EXTENDED_RELEASE_TABLET | Freq: Every day | ORAL | 0 refills | Status: DC

## 2020-04-04 MED ORDER — KETOROLAC TROMETHAMINE 15 MG/ML IJ SOLN
15.0000 mg | Freq: Once | INTRAMUSCULAR | Status: AC
  Administered 2020-04-04: 15 mg via INTRAVENOUS
  Filled 2020-04-04: qty 1

## 2020-04-04 MED ORDER — ONDANSETRON HCL 4 MG/2ML IJ SOLN
4.0000 mg | Freq: Once | INTRAMUSCULAR | Status: AC
  Administered 2020-04-04: 4 mg via INTRAVENOUS
  Filled 2020-04-04: qty 2

## 2020-04-04 MED ORDER — SODIUM CHLORIDE 0.9 % IV BOLUS
1000.0000 mL | Freq: Once | INTRAVENOUS | Status: AC
  Administered 2020-04-04: 1000 mL via INTRAVENOUS

## 2020-04-04 MED FILL — ONDANSETRON ODT 4MG TBDP: 4 | 3 days supply | Qty: 8 | Fill #0

## 2020-04-04 MED FILL — POTASSIUM CHLORIDE CRYS ER: 20 | 5 days supply | Qty: 10 | Fill #0

## 2020-04-04 NOTE — ED Notes (Signed)
Pt drinking sprite for fluid challenge

## 2020-04-04 NOTE — ED Provider Notes (Addendum)
[facility] EM[facility] DEPARTMENT Provider Note   CSN: [id number] Arrival date & time: 04/04/20  1202     History Chief Complaint  Patient presents with  . Abdominal Pain  . Headache    [name] is a 49 y.o. female who presents with headache and nausea, vomiting, diarrhea.  Patient states for the past 2 days she has had problems with generalized abdominal cramping, nausea, vomiting and diarrhea.  Patient states that she is having difficulty keeping anything down.  She cannot quantify how many episodes of emesis or diarrhea that she has had.  She tried Imodium and Tums without relief.  She states that she also has a migraine headache which started yesterday.  She states that the headache feels like prior migraines with light sensitivity and diffuse pain.  She has been on a pescatarian diet for the past 2 weeks and has lost 11 pounds and is unsure if that is contributing or not.  She does not feel like symptoms started after eating seafood.  She is unsure of any sick contacts.  She denies fever, chills, chest pain, shortness of breath, urinary symptoms.  HPI     Past Medical History:  Diagnosis Date  . Breast cancer ([place])   . Hypertension   . Migraine   . UTI (lower urinary tract infection)     Patient Active Problem List   Diagnosis Date Noted  . Migraine 12/02/2013    Past Surgical History:  Procedure Laterality Date  . BREAST SURGERY    . CHOLECYSTECTOMY    . ROTATOR CUFF REPAIR       OB History   No obstetric history on file.     No family history on file.  Social History   Tobacco Use  . Smoking status: Never Smoker  . Smokeless tobacco: Never Used  Vaping Use  . Vaping Use: Never used  Substance Use Topics  . Alcohol use: No  . Drug use: No    Home Medications Prior to Admission medications   Medication Sig Start Date End Date Taking? Authorizing Provider  diclofenac sodium (VOLTAREN) 1 % GEL Apply 2 g topically 4 (four) times  daily. 08/11/19   Caccavale, Sophia, PA-C  topiramate (TOPAMAX) 50 MG tablet Topamax 50 mg tablet  Take 1 tablet twice a day by oral route.    [provider]    Allergies    Midazolam and Midazolam hcl  Review of Systems   Review of Systems  Constitutional: Negative for chills and fever.  Eyes: Positive for photophobia.  Respiratory: Negative for shortness of breath.   Cardiovascular: Negative for chest pain.  Gastrointestinal: Positive for abdominal pain, diarrhea, nausea and vomiting. Negative for blood in stool.  Genitourinary: Negative for dysuria and flank pain.  Neurological: Positive for headaches. Negative for dizziness, syncope, weakness and numbness.  All other systems reviewed and are negative.   Physical Exam Updated Vital Signs BP (!) 170/107 (BP Location: Right Arm)   Pulse 86   Temp 98.1 F (36.7 C) (Oral)   Resp 18   Ht 5\' 1"  (1.549 m)   Wt 56.4 kg   LMP 03/31/2020   SpO2 100%   BMI 23.49 kg/m   Physical Exam Vitals and nursing note reviewed.  Constitutional:      General: She is not in acute distress.    Appearance: Normal appearance. She is well-developed. She is not ill-appearing.     Comments: Uncomfortable appearing.  Wearing sunglasses  HENT:  Head: Normocephalic and atraumatic.  Eyes:     General: No scleral icterus.       Right eye: No discharge.        Left eye: No discharge.     Conjunctiva/sclera: Conjunctivae normal.     Pupils: Pupils are equal, round, and reactive to light.  Cardiovascular:     Rate and Rhythm: Normal rate and regular rhythm.  Pulmonary:     Effort: Pulmonary effort is normal. No respiratory distress.     Breath sounds: Normal breath sounds.  Abdominal:     General: Abdomen is flat. Bowel sounds are increased. There is no distension.     Palpations: Abdomen is soft.     Tenderness: There is no abdominal tenderness.  Musculoskeletal:     Cervical back: Normal range of motion.  Skin:    General: Skin  is warm and dry.  Neurological:     Mental Status: She is alert and oriented to person, place, and time.  Psychiatric:        Behavior: Behavior normal.     ED Results / Procedures / Treatments   Labs (all labs ordered are listed, but only abnormal results are displayed) Labs Reviewed  COMPREHENSIVE METABOLIC PANEL - Abnormal; Notable for the following components:      Result Value   Potassium 3.2 (*)    Chloride 96 (*)    Glucose, Bld 104 (*)    Creatinine, Ser 1.01 (*)    Calcium 8.4 (*)    AST 52 (*)    ALT 47 (*)    Total Bilirubin 1.5 (*)    All other components within normal limits  URINALYSIS, ROUTINE W REFLEX MICROSCOPIC - Abnormal; Notable for the following components:   Protein, ur 30 (*)    All other components within normal limits  URINALYSIS, MICROSCOPIC (REFLEX) - Abnormal; Notable for the following components:   Bacteria, UA FEW (*)    All other components within normal limits  CBC WITH DIFFERENTIAL/PLATELET  LIPASE, BLOOD  PREGNANCY, URINE    EKG None  Radiology No results found.  Procedures Procedures (including critical care time)  Medications Ordered in ED Medications  sodium chloride 0.9 % bolus 1,000 mL (0 mLs Intravenous Stopped 04/04/20 1444)  ondansetron (ZOFRAN) injection 4 mg (4 mg Intravenous Given 04/04/20 1332)  ketorolac (TORADOL) 15 MG/ML injection 15 mg (15 mg Intravenous Given 04/04/20 1333)    ED Course  I have reviewed the triage vital signs and the nursing notes.  Pertinent labs & imaging results that were available during my care of the patient were reviewed by me and considered in my medical decision making (see chart for details).  49 year old female with history of migraines presents with migraine headache, nausea, vomiting and diarrhea.  Blood pressure is elevated but otherwise vital signs are reassuring.  She is uncomfortable appearing on exam.  Heart is regular rate and rhythm.  Lungs are clear to auscultation.  Abdomen is  soft and nontender.  Symptoms sound consistent with acute GI illness and I do not feel imaging is warranted today.  She has not had severe abdominal pain, fever, blood in the stool concerning for bacterial source.  Her headache feels like a typical migraine. Will obtain labs and provide symptomatic control  CBC is normal.  CMP shows mild AKI, low potassium, mild transaminitis.  Lipase is normal.  UA does not show signs of UTI  2:44 PM Pt is feeling much better as far as abdominal pain and  nausea. She states her headache is about the same but also states nothing really works for her migraines. She is comfortable with trying PO challenge.  PO challenge tolerated. Will rx Zofran and Potassium for home. Return precautions given.  MDM Rules/Calculators/A&P                           Final Clinical Impression(s) / ED Diagnoses Final diagnoses:  Other migraine without status migrainosus, not intractable  Nausea vomiting and diarrhea    Rx / DC Orders ED Discharge Orders    None       Recardo Evangelist, PA-C 04/04/20 1524    Recardo Evangelist, PA-C 04/04/20 1525    Lajean Saver, MD 04/04/20 1530

## 2020-04-04 NOTE — Discharge Instructions (Signed)
Your symptoms today are consistent with a stomach virus. It should get better in the next 2-3 days Please drink plenty of fluids Take potassium supplement daily Take Zofran as needed for nausea Return if worsening

## 2020-04-04 NOTE — ED Triage Notes (Signed)
Pt arrives to ED with c/o abdominal pain X2 days. Pt states that she recently changed her diet and has lost a lot of weight reports she has had a migraine for a few days and has also had NVD.

## 2020-07-13 ENCOUNTER — Encounter (HOSPITAL_BASED_OUTPATIENT_CLINIC_OR_DEPARTMENT_OTHER): Payer: Self-pay

## 2020-07-13 ENCOUNTER — Emergency Department (HOSPITAL_BASED_OUTPATIENT_CLINIC_OR_DEPARTMENT_OTHER): Payer: Medicaid Other

## 2020-07-13 ENCOUNTER — Emergency Department (HOSPITAL_BASED_OUTPATIENT_CLINIC_OR_DEPARTMENT_OTHER)
Admission: EM | Admit: 2020-07-13 | Discharge: 2020-07-14 | Disposition: A | Discharge status: Home / Self Care | Payer: Medicaid Other | Attending: Emergency Medicine | Admitting: Emergency Medicine

## 2020-07-13 ENCOUNTER — Other Ambulatory Visit: Payer: Self-pay

## 2020-07-13 DIAGNOSIS — W228XXA Striking against or struck by other objects, initial encounter: Secondary | ICD-10-CM | POA: Diagnosis not present

## 2020-07-13 DIAGNOSIS — Y9289 Other specified places as the place of occurrence of the external cause: Secondary | ICD-10-CM | POA: Diagnosis not present

## 2020-07-13 DIAGNOSIS — S4991XA Unspecified injury of right shoulder and upper arm, initial encounter: Secondary | ICD-10-CM | POA: Insufficient documentation

## 2020-07-13 DIAGNOSIS — S4990XA Unspecified injury of shoulder and upper arm, unspecified arm, initial encounter: Secondary | ICD-10-CM | POA: Diagnosis present

## 2020-07-13 DIAGNOSIS — R05 Cough: Secondary | ICD-10-CM | POA: Insufficient documentation

## 2020-07-13 DIAGNOSIS — C50919 Malignant neoplasm of unspecified site of unspecified female breast: Secondary | ICD-10-CM | POA: Insufficient documentation

## 2020-07-13 DIAGNOSIS — Y9389 Activity, other specified: Secondary | ICD-10-CM | POA: Diagnosis not present

## 2020-07-13 DIAGNOSIS — M25511 Pain in right shoulder: Secondary | ICD-10-CM

## 2020-07-13 DIAGNOSIS — I10 Essential (primary) hypertension: Secondary | ICD-10-CM | POA: Diagnosis not present

## 2020-07-13 DIAGNOSIS — Z20822 Contact with and (suspected) exposure to covid-19: Secondary | ICD-10-CM

## 2020-07-13 NOTE — ED Notes (Signed)
Ice pack provided for pt to application to rt shoulder

## 2020-07-13 NOTE — ED Triage Notes (Signed)
Pt c/o flu like sx x 3 days-also injured right shoulder yesterday-NAD-steady gait

## 2020-07-13 NOTE — ED Notes (Signed)
Allergy status verified

## 2020-07-13 NOTE — ED Notes (Signed)
Has normal rt hand grip with good cap refill and 2 plus rt radial pulse, denies any numbness or tingling. Noted to appear uncomfortable with facial grimmacing

## 2020-07-13 NOTE — ED Notes (Signed)
States hit rt shoulder area with car door yesterday, pain has become progressively worse, states she had rotator cuff repair approx 6 - 7 years ago. Rt arm has normal sensation per pt, but moving it or attempting to raise rt arm increases pain.

## 2020-07-14 ENCOUNTER — Encounter (HOSPITAL_BASED_OUTPATIENT_CLINIC_OR_DEPARTMENT_OTHER): Payer: Self-pay | Admitting: Emergency Medicine

## 2020-07-14 MED ORDER — KETOROLAC TROMETHAMINE 60 MG/2ML IM SOLN
30.0000 mg | Freq: Once | INTRAMUSCULAR | Status: AC
  Administered 2020-07-14: 30 mg via INTRAMUSCULAR
  Filled 2020-07-14: qty 2

## 2020-07-14 MED ORDER — LIDOCAINE 5 % EX PTCH: 1.0000 | MEDICATED_PATCH | CUTANEOUS | 0 refills | Status: DC

## 2020-07-14 MED ORDER — LIDOCAINE 5 % EX PTCH
1.0000 | MEDICATED_PATCH | CUTANEOUS | Status: DC
  Administered 2020-07-14: 1 via TRANSDERMAL
  Filled 2020-07-14: qty 1

## 2020-07-14 MED FILL — LIDOCAINE PATCH 5%: 5 | 30 days supply | Qty: 30 | Fill #0

## 2020-07-14 NOTE — ED Provider Notes (Signed)
[facility] EMERGENCY DEPARTMENT Provider Note   CSN: [id number] Arrival date & time: 07/13/20  2052     History Chief Complaint  Patient presents with  . Cough  . Shoulder Injury    [name] is a 49 y.o. female.  The history is provided by the patient.  Cough Cough characteristics:  Non-productive Severity:  Mild Onset quality:  Gradual Duration:  3 days Timing:  Intermittent Progression:  Unchanged Chronicity:  New Context: sick contacts   Context comment:  Someone with covid 19  Relieved by:  Nothing Worsened by:  Nothing Ineffective treatments:  None tried Associated symptoms: no chest pain, no fever, no rash, no shortness of breath and no sinus congestion   Risk factors: no chemical exposure   Shoulder Injury This is a new problem. The current episode started yesterday. The problem occurs constantly. The problem has not changed since onset.Pertinent negatives include no chest pain, no abdominal pain and no shortness of breath. Nothing aggravates the symptoms. Nothing relieves the symptoms. She has tried nothing for the symptoms. The treatment provided no relief.  Patient was exposed to covid and has a cough.  Moreover, patient hit shoulder on a door yesterday and has pain.  No weakness no numbness.      Past Medical History:  Diagnosis Date  . Breast cancer ([place])   . Hypertension   . Migraine   . UTI (lower urinary tract infection)     Patient Active Problem List   Diagnosis Date Noted  . Migraine 12/02/2013    Past Surgical History:  Procedure Laterality Date  . BREAST SURGERY    . CHOLECYSTECTOMY    . ROTATOR CUFF REPAIR       OB History   No obstetric history on file.     History reviewed. No pertinent family history.  Social History   Tobacco Use  . Smoking status: Never Smoker  . Smokeless tobacco: Never Used  Vaping Use  . Vaping Use: Never used  Substance Use Topics  . Alcohol use: No  . Drug use: No    Home  Medications Prior to Admission medications   Medication Sig Start Date End Date Taking? Authorizing Provider  diclofenac sodium (VOLTAREN) 1 % GEL Apply 2 g topically 4 (four) times daily. 08/11/19   Caccavale, Sophia, PA-C  ondansetron (ZOFRAN ODT) 4 MG disintegrating tablet Take 1 tablet (4 mg total) by mouth every 8 (eight) hours as needed for nausea or vomiting. 04/04/20   Recardo Evangelist, PA-C  potassium chloride SA (KLOR-CON) 20 MEQ tablet Take 2 tablets (40 mEq total) by mouth daily. 04/04/20   Recardo Evangelist, PA-C  topiramate (TOPAMAX) 50 MG tablet Topamax 50 mg tablet  Take 1 tablet twice a day by oral route.    [provider]    Allergies    Midazolam and Midazolam hcl  Review of Systems   Review of Systems  Constitutional: Negative for fever.  HENT: Negative for congestion.   Eyes: Negative for visual disturbance.  Respiratory: Positive for cough. Negative for shortness of breath.   Cardiovascular: Negative for chest pain.  Gastrointestinal: Negative for abdominal pain.  Genitourinary: Negative for difficulty urinating.  Musculoskeletal: Positive for arthralgias.  Skin: Negative for rash.  Neurological: Negative for dizziness.  Psychiatric/Behavioral: Negative for agitation.  All other systems reviewed and are negative.   Physical Exam Updated Vital Signs BP (!) 176/109 (BP Location: Left Arm)   Pulse 71   Temp 98.1 F (  36.7 C) (Oral)   Resp 18   Ht 5\' 1"  (1.549 m)   Wt 54.9 kg   LMP 07/13/2020   SpO2 100%   BMI 22.86 kg/m   Physical Exam Vitals and nursing note reviewed.  Constitutional:      General: She is not in acute distress.    Appearance: Normal appearance.  HENT:     Head: Normocephalic and atraumatic.     Nose: Nose normal.  Eyes:     Conjunctiva/sclera: Conjunctivae normal.     Pupils: Pupils are equal, round, and reactive to light.  Cardiovascular:     Rate and Rhythm: Normal rate and regular rhythm.     Pulses: Normal  pulses.     Heart sounds: Normal heart sounds.  Pulmonary:     Effort: Pulmonary effort is normal.     Breath sounds: Normal breath sounds.  Abdominal:     General: Abdomen is flat. Bowel sounds are normal.     Palpations: Abdomen is soft.     Tenderness: There is no abdominal tenderness.  Musculoskeletal:     Right shoulder: Normal.     Right upper arm: Normal.     Right elbow: Normal.     Right forearm: Normal.     Right wrist: Normal.     Right hand: Normal.     Cervical back: Normal range of motion and neck supple.     Comments: No winging of the scapula, negative NEers test of the right shoulder.    Skin:    General: Skin is warm and dry.     Capillary Refill: Capillary refill takes less than 2 seconds.  Neurological:     General: No focal deficit present.     Mental Status: She is alert and oriented to person, place, and time.  Psychiatric:        Mood and Affect: Mood normal.        Behavior: Behavior normal.     ED Results / Procedures / Treatments   Labs (all labs ordered are listed, but only abnormal results are displayed) Labs Reviewed  RESPIRATORY PANEL BY RT PCR (FLU A&B, COVID)    EKG None  Radiology DG Shoulder Right  Result Date: 07/13/2020 CLINICAL DATA:  Pain EXAM: RIGHT SHOULDER - 2+ VIEW COMPARISON:  None. FINDINGS: There is no acute displaced fracture or dislocation. There is mild glenohumeral osteoarthritis. The osseous mineralization is within normal limits. IMPRESSION: Mild glenohumeral osteoarthritis. No acute fracture or dislocation. Electronically Signed   By: Constance Holster M.D.   On: 07/13/2020 21:50    Procedures Procedures (including critical care time)  Medications Ordered in ED Medications  lidocaine (LIDODERM) 5 % 1 patch (has no administration in time range)  ketorolac (TORADOL) injection 30 mg (30 mg Intramuscular Given 07/14/20 0010)    ED Course  I have reviewed the triage vital signs and the nursing notes.  Pertinent  labs & imaging results that were available during my care of the patient were reviewed by me and considered in my medical decision making (see chart for details).    There is no sign of a rotator cuff injury but patient does not want to move the arm.  She has been counseled that if she does not move it she can develop a frozen shoulder and this will be an surgical issue.  I have advised gentle ROM and switching to heat therapy.  Covid test is pending and will appear in my chart. Normal exam and  oxygen saturation.  Stable for discharge with close follow up.  Strict quarantine instructions given.     [name] was evaluated in Emergency Department on 07/14/2020 for the symptoms described in the history of present illness. She was evaluated in the context of the global COVID-19 pandemic, which necessitated consideration that the patient might be at risk for infection with the SARS-CoV-2 virus that causes COVID-19. Institutional protocols and algorithms that pertain to the evaluation of patients at risk for COVID-19 are in a state of rapid change based on information released by regulatory bodies including the CDC and federal and state organizations. These policies and algorithms were followed during the patient's care in the ED.   Final Clinical Impression(s) / ED Diagnoses Return for intractable cough, coughing up blood,fevers >100.4 unrelieved by medication, shortness of breath, intractable vomiting, chest pain, shortness of breath, weakness,numbness, changes in speech, facial asymmetry,abdominal pain, passing out,Inability to tolerate liquids or food, cough, altered mental status or any concerns. No signs of systemic illness or infection. The patient is nontoxic-appearing on exam and vital signs are within normal limits.   I have reviewed the triage vital signs and the nursing notes. Pertinent labs &imaging results that were available during my care of the patient were reviewed by me and  considered in my medical decision making (see chart for details).After history, exam, and medical workup I feel the patient has beenappropriately medically screened and is safe for discharge home. Pertinent diagnoses were discussed with the patient. Patient was given return precautions.   Shye Doty, MD 07/14/20 0040

## 2020-07-14 NOTE — ED Notes (Signed)
ED Provider at bedside. 

## 2020-07-14 NOTE — ED Notes (Signed)
Pt states rt shoulder feels much better post lido patch application, reviewed AVS with pt and pain control tech per EDP recommendations. Copy of AVS provided. Opportunity for questions provided

## 2020-07-14 NOTE — Discharge Instructions (Signed)
Person Under Monitoring Name: [name]  Location: [address] [place] [address]   Infection Prevention Recommendations for Individuals Confirmed to have, or Being Evaluated for, 2019 Novel Coronavirus (COVID-19) Infection Who Receive Care at Home  Individuals who are confirmed to have, or are being evaluated for, COVID-19 should follow the prevention steps below until a healthcare provider or local or state health department says they can return to normal activities.  Stay home except to get medical care You should restrict activities outside your home, except for getting medical care. Do not go to work, school, or public areas, and do not use public transportation or taxis.  Call ahead before visiting your doctor Before your medical appointment, call the healthcare provider and tell them that you have, or are being evaluated for, COVID-19 infection. This will help the healthcare provider's office take steps to keep other people from getting infected. Ask your healthcare provider to call the local or state health department.  Monitor your symptoms Seek prompt medical attention if your illness is worsening (e.g., difficulty breathing). Before going to your medical appointment, call the healthcare provider and tell them that you have, or are being evaluated for, COVID-19 infection. Ask your healthcare provider to call the local or state health department.  Wear a facemask You should wear a facemask that covers your nose and mouth when you are in the same room with other people and when you visit a healthcare provider. People who live with or visit you should also wear a facemask while they are in the same room with you.  Separate yourself from other people in your home As much as possible, you should stay in a different room from other people in your home. Also, you should use a separate bathroom, if available.  Avoid sharing household items You should  not share dishes, drinking glasses, cups, eating utensils, towels, bedding, or other items with other people in your home. After using these items, you should wash them thoroughly with soap and water.  Cover your coughs and sneezes Cover your mouth and nose with a tissue when you cough or sneeze, or you can cough or sneeze into your sleeve. Throw used tissues in a lined trash can, and immediately wash your hands with soap and water for at least 20 seconds or use an alcohol-based hand rub.  Wash your [facility] your hands often and thoroughly with soap and water for at least 20 seconds. You can use an alcohol-based hand sanitizer if soap and water are not available and if your hands are not visibly dirty. Avoid touching your eyes, nose, and mouth with unwashed hands.   Prevention Steps for Caregivers and Household Members of Individuals Confirmed to have, or Being Evaluated for, COVID-19 Infection Being Cared for in the Home  If you live with, or provide care at home for, a person confirmed to have, or being evaluated for, COVID-19 infection please follow these guidelines to prevent infection:  Follow healthcare provider's instructions Make sure that you understand and can help the patient follow any healthcare provider instructions for all care.  Provide for the patient's basic needs You should help the patient with basic needs in the home and provide support for getting groceries, prescriptions, and other personal needs.  Monitor the patient's symptoms If they are getting sicker, call his or her medical provider and tell them that the patient has, or is being evaluated for, COVID-19 infection. This will help the healthcare  provider's office take steps to keep other people from getting infected. Ask the healthcare provider to call the local or state health department.  Limit the number of people who have contact with the patient If possible, have only one caregiver for the  patient. Other household members should stay in another home or place of residence. If this is not possible, they should stay in another room, or be separated from the patient as much as possible. Use a separate bathroom, if available. Restrict visitors who do not have an essential need to be in the home.  Keep older adults, very young children, and other sick people away from the patient Keep older adults, very young children, and those who have compromised immune systems or chronic health conditions away from the patient. This includes people with chronic heart, lung, or kidney conditions, diabetes, and cancer.  Ensure good ventilation Make sure that shared spaces in the home have good air flow, such as from an air conditioner or an opened window, weather permitting.  Wash your hands often Wash your hands often and thoroughly with soap and water for at least 20 seconds. You can use an alcohol based hand sanitizer if soap and water are not available and if your hands are not visibly dirty. Avoid touching your eyes, nose, and mouth with unwashed hands. Use disposable paper towels to dry your hands. If not available, use dedicated cloth towels and replace them when they become wet.  Wear a facemask and gloves Wear a disposable facemask at all times in the room and gloves when you touch or have contact with the patient's blood, body fluids, and/or secretions or excretions, such as sweat, saliva, sputum, nasal mucus, vomit, urine, or feces.  Ensure the mask fits over your nose and mouth tightly, and do not touch it during use. Throw out disposable facemasks and gloves after using them. Do not reuse. Wash your hands immediately after removing your facemask and gloves. If your personal clothing becomes contaminated, carefully remove clothing and launder. Wash your hands after handling contaminated clothing. Place all used disposable facemasks, gloves, and other waste in a lined container before  disposing them with other household waste. Remove gloves and wash your hands immediately after handling these items.  Do not share dishes, glasses, or other household items with the patient Avoid sharing household items. You should not share dishes, drinking glasses, cups, eating utensils, towels, bedding, or other items with a patient who is confirmed to have, or being evaluated for, COVID-19 infection. After the person uses these items, you should wash them thoroughly with soap and water.  Wash laundry thoroughly Immediately remove and wash clothes or bedding that have blood, body fluids, and/or secretions or excretions, such as sweat, saliva, sputum, nasal mucus, vomit, urine, or feces, on them. Wear gloves when handling laundry from the patient. Read and follow directions on labels of laundry or clothing items and detergent. In general, wash and dry with the warmest temperatures recommended on the label.  Clean all areas the individual has used often Clean all touchable surfaces, such as counters, tabletops, doorknobs, bathroom fixtures, toilets, phones, keyboards, tablets, and bedside tables, every day. Also, clean any surfaces that may have blood, body fluids, and/or secretions or excretions on them. Wear gloves when cleaning surfaces the patient has come in contact with. Use a diluted bleach solution (e.g., dilute bleach with 1 part bleach and 10 parts water) or a household disinfectant with a label that says EPA-registered for coronaviruses. To make  a bleach solution at home, add 1 tablespoon of bleach to 1 quart (4 cups) of water. For a larger supply, add  cup of bleach to 1 gallon (16 cups) of water. Read labels of cleaning products and follow recommendations provided on product labels. Labels contain instructions for safe and effective use of the cleaning product including precautions you should take when applying the product, such as wearing gloves or eye protection and making sure you  have good ventilation during use of the product. Remove gloves and wash hands immediately after cleaning.  Monitor yourself for signs and symptoms of illness Caregivers and household members are considered close contacts, should monitor their health, and will be asked to limit movement outside of the home to the extent possible. Follow the monitoring steps for close contacts listed on the symptom monitoring form.   ? If you have additional questions, contact your local health department or call the epidemiologist on call at [phone] (available 24/7). ? This guidance is subject to change. For the most up-to-date guidance from [facility], please refer to their website: [email or web address]

## 2022-09-15 ENCOUNTER — Other Ambulatory Visit: Payer: Self-pay

## 2022-09-15 ENCOUNTER — Emergency Department (HOSPITAL_BASED_OUTPATIENT_CLINIC_OR_DEPARTMENT_OTHER): Payer: Medicaid Other

## 2022-09-15 ENCOUNTER — Emergency Department (HOSPITAL_BASED_OUTPATIENT_CLINIC_OR_DEPARTMENT_OTHER)
Admission: EM | Admit: 2022-09-15 | Discharge: 2022-09-15 | Disposition: A | Discharge status: Home / Self Care | Payer: Medicaid Other | Attending: Emergency Medicine | Admitting: Emergency Medicine

## 2022-09-15 ENCOUNTER — Encounter (HOSPITAL_BASED_OUTPATIENT_CLINIC_OR_DEPARTMENT_OTHER): Payer: Self-pay | Admitting: Emergency Medicine

## 2022-09-15 DIAGNOSIS — R051 Acute cough: Secondary | ICD-10-CM | POA: Insufficient documentation

## 2022-09-15 DIAGNOSIS — Z853 Personal history of malignant neoplasm of breast: Secondary | ICD-10-CM | POA: Insufficient documentation

## 2022-09-15 DIAGNOSIS — Z1152 Encounter for screening for COVID-19: Secondary | ICD-10-CM | POA: Insufficient documentation

## 2022-09-15 DIAGNOSIS — Z79899 Other long term (current) drug therapy: Secondary | ICD-10-CM | POA: Insufficient documentation

## 2022-09-15 DIAGNOSIS — I10 Essential (primary) hypertension: Secondary | ICD-10-CM | POA: Diagnosis not present

## 2022-09-15 DIAGNOSIS — R079 Chest pain, unspecified: Secondary | ICD-10-CM | POA: Insufficient documentation

## 2022-09-15 DIAGNOSIS — R059 Cough, unspecified: Secondary | ICD-10-CM | POA: Diagnosis present

## 2022-09-15 LAB — CBC WITH DIFFERENTIAL/PLATELET
Abs Immature Granulocytes: 0.04 10*3/uL (ref 0.00–0.07)
Basophils Absolute: 0.1 10*3/uL (ref 0.0–0.1)
Basophils Relative: 1 %
Eosinophils Absolute: 0.3 10*3/uL (ref 0.0–0.5)
Eosinophils Relative: 3 %
HCT: 38.3 % (ref 36.0–46.0)
Hemoglobin: 12.9 g/dL (ref 12.0–15.0)
Immature Granulocytes: 0 %
Lymphocytes Relative: 22 %
Lymphs Abs: 2.7 10*3/uL (ref 0.7–4.0)
MCH: 29.4 pg (ref 26.0–34.0)
MCHC: 33.7 g/dL (ref 30.0–36.0)
MCV: 87.2 fL (ref 80.0–100.0)
Monocytes Absolute: 1.2 10*3/uL — ABNORMAL HIGH (ref 0.1–1.0)
Monocytes Relative: 9 %
Neutro Abs: 8 10*3/uL — ABNORMAL HIGH (ref 1.7–7.7)
Neutrophils Relative %: 65 %
Platelets: 308 10*3/uL (ref 150–400)
RBC: 4.39 MIL/uL (ref 3.87–5.11)
RDW: 12.9 % (ref 11.5–15.5)
WBC: 12.3 10*3/uL — ABNORMAL HIGH (ref 4.0–10.5)
nRBC: 0 % (ref 0.0–0.2)

## 2022-09-15 LAB — BASIC METABOLIC PANEL
Anion gap: 13 (ref 5–15)
BUN: 13 mg/dL (ref 6–20)
CO2: 25 mmol/L (ref 22–32)
Calcium: 9.5 mg/dL (ref 8.9–10.3)
Chloride: 102 mmol/L (ref 98–111)
Creatinine, Ser: 0.95 mg/dL (ref 0.44–1.00)
GFR, Estimated: >60 mL/min (ref >60)
Glucose, Bld: 103 mg/dL — ABNORMAL HIGH (ref 70–99)
Potassium: 3.5 mmol/L (ref 3.5–5.1)
Sodium: 140 mmol/L (ref 135–145)

## 2022-09-15 LAB — RESP PANEL BY RT-PCR (FLU A&B, COVID) ARPGX2
Influenza A by PCR: NEGATIVE
Influenza B by PCR: NEGATIVE
SARS Coronavirus 2 by RT PCR: NEGATIVE

## 2022-09-15 LAB — HCG, SERUM, QUALITATIVE: Preg, Serum: NEGATIVE

## 2022-09-15 LAB — D-DIMER, QUANTITATIVE: D-Dimer, Quant: 0.84 ug/mL-FEU — ABNORMAL HIGH (ref 0.00–0.50)

## 2022-09-15 LAB — TROPONIN I (HIGH SENSITIVITY): Troponin I (High Sensitivity): 4 ng/L (ref <18)

## 2022-09-15 MED ORDER — BENZONATATE 100 MG PO CAPS
100.0000 mg | ORAL_CAPSULE | Freq: Once | ORAL | Status: AC
  Administered 2022-09-15: 100 mg via ORAL
  Filled 2022-09-15: qty 1

## 2022-09-15 MED ORDER — KETOROLAC TROMETHAMINE 15 MG/ML IJ SOLN
15.0000 mg | Freq: Once | INTRAMUSCULAR | Status: AC
  Administered 2022-09-15: 15 mg via INTRAVENOUS
  Filled 2022-09-15: qty 1

## 2022-09-15 MED ORDER — ACETAMINOPHEN 500 MG PO TABS
1000.0000 mg | ORAL_TABLET | Freq: Once | ORAL | Status: AC
  Administered 2022-09-15: 1000 mg via ORAL
  Filled 2022-09-15: qty 2

## 2022-09-15 MED ORDER — IOHEXOL 350 MG/ML SOLN
75.0000 mL | Freq: Once | INTRAVENOUS | Status: AC | PRN
  Administered 2022-09-15: 75 mL via INTRAVENOUS

## 2022-09-15 MED ORDER — BENZONATATE 100 MG PO CAPS: 100.0000 mg | ORAL_CAPSULE | Freq: Three times a day (TID) | ORAL | 0 refills | Status: DC

## 2022-09-15 NOTE — Discharge Instructions (Addendum)
It was a pleasure taking care of you today.  As discussed, all of your labs are reassuring.  I suspect you have a viral infection causing your symptoms.  I am sending you home with cough medication.  Your CT scan did not show a blood clot.  Take over-the-counter ibuprofen or Tylenol as needed for pain and fever.  Please follow-up with PCP if symptoms do not improve over the next few days.  Return to the ER for any worsening symptoms.

## 2022-09-15 NOTE — ED Triage Notes (Signed)
Generalized body aches, cough, congestion, and headache since yesterday. Also endorses upper chest pain only when coughing.

## 2022-09-15 NOTE — ED Notes (Signed)
Pt reports a decrease in pain b/c "I'm not coughing as much."

## 2022-09-15 NOTE — ED Notes (Signed)
Pt is in tears from the pain, "hurts most when I cough."  Pt denies N/V/D and other issues.

## 2022-09-15 NOTE — ED Provider Notes (Signed)
[facility] Provider Note   CSN: [id number] Arrival date & time: 09/15/22  9211     History  Chief Complaint  Patient presents with   Cough   Generalized Body Aches    [name] is a 51 y.o. female with a past medical history significant for migraines, hypertension, and history of breast cancer who presents to the ED due to cough, nasal congestion, body aches, and headache for the past few days.  Patient also admits to some chest pain.  No fever or chills.  Denies nausea, vomiting, diarrhea.  No history of blood clots, recent surgeries or recent long immobilizations, or hormonal treatments.  History of breast cancer currently in remission.   History obtained from patient and past medical records. No interpreter used during encounter.       Home Medications Prior to Admission medications   Medication Sig Start Date End Date Taking? Authorizing Provider  benzonatate (TESSALON) 100 MG capsule Take 1 capsule (100 mg total) by mouth every 8 (eight) hours. 09/15/22  Yes Lavenia Stumpo, Chrys Racer C, PA-C  diclofenac sodium (VOLTAREN) 1 % GEL Apply 2 g topically 4 (four) times daily. 08/11/19   Caccavale, Sophia, PA-C  lidocaine (LIDODERM) 5 % Place 1 patch onto the skin daily. Remove & Discard patch within 12 hours or as directed by MD 07/14/20   Randal Buba, April, MD  ondansetron (ZOFRAN ODT) 4 MG disintegrating tablet Take 1 tablet (4 mg total) by mouth every 8 (eight) hours as needed for nausea or vomiting. 04/04/20   Recardo Evangelist, PA-C  potassium chloride SA (KLOR-CON) 20 MEQ tablet Take 2 tablets (40 mEq total) by mouth daily. 04/04/20   Recardo Evangelist, PA-C  topiramate (TOPAMAX) 50 MG tablet Topamax 50 mg tablet  Take 1 tablet twice a day by oral route.    [provider]      Allergies    Midazolam and Midazolam hcl    Review of Systems   Review of Systems  Constitutional:  Negative for chills and fever.  HENT:  Positive for  congestion.   Respiratory:  Positive for cough and shortness of breath.   Cardiovascular:  Positive for chest pain. Negative for leg swelling.  Gastrointestinal:  Negative for abdominal pain, diarrhea, nausea and vomiting.  Genitourinary:  Negative for dysuria.  All other systems reviewed and are negative.   Physical Exam Updated Vital Signs BP (!) 168/116   Pulse (!) 106   Temp 100.2 F (37.9 C) (Oral)   Resp 18   Ht '5\' 1"'$  (1.549 m)   Wt 61.2 kg   LMP 09/09/2022 (Exact Date)   SpO2 98%   BMI 25.51 kg/m  Physical Exam Vitals and nursing note reviewed.  Constitutional:      General: She is not in acute distress.    Appearance: She is not ill-appearing.  HENT:     Head: Normocephalic.     Nose: Congestion present.  Eyes:     Pupils: Pupils are equal, round, and reactive to light.  Cardiovascular:     Rate and Rhythm: Normal rate and regular rhythm.     Pulses: Normal pulses.     Heart sounds: Normal heart sounds. No murmur heard.    No friction rub. No gallop.  Pulmonary:     Effort: Pulmonary effort is normal.     Breath sounds: Normal breath sounds.  Abdominal:     General: Abdomen is flat. There is no distension.  Palpations: Abdomen is soft.     Tenderness: There is no abdominal tenderness. There is no guarding or rebound.  Musculoskeletal:        General: Normal range of motion.     Cervical back: Neck supple.  Skin:    General: Skin is warm and dry.  Neurological:     General: No focal deficit present.     Mental Status: She is alert.  Psychiatric:        Mood and Affect: Mood normal.        Behavior: Behavior normal.     ED Results / Procedures / Treatments   Labs (all labs ordered are listed, but only abnormal results are displayed) Labs Reviewed  CBC WITH DIFFERENTIAL/PLATELET - Abnormal; Notable for the following components:      Result Value   WBC 12.3 (*)    Neutro Abs 8.0 (*)    Monocytes Absolute 1.2 (*)    All other components within  normal limits  BASIC METABOLIC PANEL - Abnormal; Notable for the following components:   Glucose, Bld 103 (*)    All other components within normal limits  D-DIMER, QUANTITATIVE - Abnormal; Notable for the following components:   D-Dimer, Quant 0.84 (*)    All other components within normal limits  RESP PANEL BY RT-PCR (FLU A&B, COVID) ARPGX2  HCG, SERUM, QUALITATIVE  TROPONIN I (HIGH SENSITIVITY)    EKG None  Radiology CT Angio Chest PE W and/or Wo Contrast  Result Date: 09/15/2022 CLINICAL DATA:  Pulmonary embolism suspected, high probability. Generalized body aches, congestion, and headache. EXAM: CT ANGIOGRAPHY CHEST WITH CONTRAST TECHNIQUE: Multidetector CT imaging of the chest was performed using the standard protocol during bolus administration of intravenous contrast. Multiplanar CT image reconstructions and MIPs were obtained to evaluate the vascular anatomy. RADIATION DOSE REDUCTION: This exam was performed according to the departmental dose-optimization program which includes automated exposure control, adjustment of the mA and/or kV according to patient size and/or use of iterative reconstruction technique. CONTRAST:  70m OMNIPAQUE IOHEXOL 350 MG/ML SOLN COMPARISON:  None Available. FINDINGS: Cardiovascular: Heart is normal in size and there is a trace pericardial effusion. The aorta and pulmonary trunk are normal in caliber. No pulmonary artery filling defect. Mediastinum/Nodes: No mediastinal, hilar, or axillary lymphadenopathy. The thyroid gland, trachea, and esophagus are within normal limits. Lungs/Pleura: Lungs are clear. No pleural effusion or pneumothorax. Upper Abdomen: The gallbladder is surgically absent. A nonobstructive calculus is present in the upper pole of the right kidney. No acute abnormality Musculoskeletal: No acute osseous abnormality. Review of the MIP images confirms the above findings. IMPRESSION: 1. No evidence of pulmonary embolism or other acute process. 2.  Nonobstructive right renal calculus. Electronically Signed   By: LBrett FairyM.D.   On: 09/15/2022 22:35   DG Chest 2 View  Result Date: 09/15/2022 CLINICAL DATA:  Chest pain and congestion EXAM: CHEST - 2 VIEW COMPARISON:  07/06/2022 FINDINGS: The heart size and mediastinal contours are within normal limits. Both lungs are clear. The visualized skeletal structures are unremarkable. IMPRESSION: No active cardiopulmonary disease. Electronically Signed   By: MInez CatalinaM.D.   On: 09/15/2022 19:06    Procedures Procedures    Medications Ordered in ED Medications  acetaminophen (TYLENOL) tablet 1,000 mg (has no administration in time range)  benzonatate (TESSALON) capsule 100 mg (100 mg Oral Given 09/15/22 2159)  ketorolac (TORADOL) 15 MG/ML injection 15 mg (15 mg Intravenous Given 09/15/22 2155)  iohexol (OMNIPAQUE) 350 MG/ML injection  75 mL (75 mLs Intravenous Contrast Given 09/15/22 2222)    ED Course/ Medical Decision Making/ A&P Clinical Course as of 09/15/22 2252  Sun Sep 15, 2022  2057 Pulse Rate(!): 116 [CA]  2207 D-Dimer, Quant(!): 0.84 [CA]  2207 WBC(!): 12.3 [CA]    Clinical Course User Index [CA] Suzy Bouchard, PA-C                           Medical Decision Making Amount and/or Complexity of Data Reviewed Labs: ordered. Decision-making details documented in ED Course. Radiology: ordered and independent interpretation performed. Decision-making details documented in ED Course. ECG/medicine tests: ordered and independent interpretation performed. Decision-making details documented in ED Course.  Risk OTC drugs. Prescription drug management.   This patient presents to the ED for concern of cough, CP, this involves an extensive number of treatment options, and is a complaint that carries with it a high risk of complications and morbidity.  The differential diagnosis includes COVID, flu, pneumonia, ACS, PE, etc  51 year old female presents to the ED due to  body aches, cough, congestion, and headache for the past few days.  She is also endorsing chest pain and some shortness of breath.  History of breast cancer currently in remission.  No history of blood clots.  Upon arrival, patient afebrile and tachycardic at 116.  No hypoxia.  Patient in no acute distress.  Reassuring physical exam.  Lungs clear to auscultation bilaterally.  No lower extremity edema.  Abdomen soft, nondistended, nontender.  COVID/flu test ordered at triage which were negative.  Given patient's history of breast cancer we will obtain routine labs and D-dimer due to chest pain and shortness of breath to rule out PE.   Chest x-ray personally reviewed and interpreted which are negative for signs of pneumonia, pneumothorax, or widened mediastinum.  EKG demonstrates sinus tachycardia.  No evidence of ischemia.  Normal troponin. Low suspicion for ACS. D-dimer elevated.  CTA ordered to rule out PE.  CMP significant for mild leukocytosis at 12.3.  BMP reassuring.  Normal renal function.  No major electrolyte derangements.  Pregnancy test negative.  CTA chest negative for PE or evidence of pneumonia.  Incidental finding of nonobstructive right renal calculus.  Suspect tachycardia related to possible fever given oral temperature has increased. Patient given Tylenol here in the ER. With reassuring workup, low suspicion for bacterial infection. Low suspicion for sepsis. Suspect viral etiology.  No evidence of PE or pneumonia on CT scan.  Patient discharged with symptomatic treatment.  Advised patient follow-up with PCP if symptoms do not improve over the next few days. Strict ED precautions discussed with patient. Patient states understanding and agrees to plan. Patient discharged home in no acute distress and stable vitals       Final Clinical Impression(s) / ED Diagnoses Final diagnoses:  Acute cough  Chest pain, unspecified type    Rx / DC Orders ED Discharge Orders          Ordered     benzonatate (TESSALON) 100 MG capsule  Every 8 hours        09/15/22 2246              Suzy Bouchard, PA-C 09/15/22 2254    Drenda Freeze, MD 09/15/22 2320

## 2022-09-15 NOTE — ED Notes (Signed)
Pt to CT

## 2022-10-08 ENCOUNTER — Emergency Department (HOSPITAL_BASED_OUTPATIENT_CLINIC_OR_DEPARTMENT_OTHER): Payer: Medicaid Other

## 2022-10-08 ENCOUNTER — Other Ambulatory Visit: Payer: Self-pay

## 2022-10-08 ENCOUNTER — Encounter (HOSPITAL_BASED_OUTPATIENT_CLINIC_OR_DEPARTMENT_OTHER): Payer: Self-pay | Admitting: Emergency Medicine

## 2022-10-08 ENCOUNTER — Emergency Department (HOSPITAL_BASED_OUTPATIENT_CLINIC_OR_DEPARTMENT_OTHER)
Admission: EM | Admit: 2022-10-08 | Discharge: 2022-10-08 | Disposition: A | Discharge status: Home / Self Care | Payer: Medicaid Other | Attending: Emergency Medicine | Admitting: Emergency Medicine

## 2022-10-08 DIAGNOSIS — W010XXA Fall on same level from slipping, tripping and stumbling without subsequent striking against object, initial encounter: Secondary | ICD-10-CM | POA: Diagnosis not present

## 2022-10-08 DIAGNOSIS — S300XXA Contusion of lower back and pelvis, initial encounter: Secondary | ICD-10-CM

## 2022-10-08 DIAGNOSIS — S3992XA Unspecified injury of lower back, initial encounter: Secondary | ICD-10-CM | POA: Diagnosis present

## 2022-10-08 DIAGNOSIS — W19XXXA Unspecified fall, initial encounter: Secondary | ICD-10-CM

## 2022-10-08 DIAGNOSIS — M545 Low back pain, unspecified: Secondary | ICD-10-CM

## 2022-10-08 MED ORDER — LIDOCAINE 5 % EX PTCH
1.0000 | MEDICATED_PATCH | CUTANEOUS | Status: DC
  Administered 2022-10-08: 1 via TRANSDERMAL
  Filled 2022-10-08: qty 1

## 2022-10-08 MED ORDER — HYDROCODONE-ACETAMINOPHEN 5-325 MG PO TABS
1.0000 | ORAL_TABLET | Freq: Once | ORAL | Status: AC
  Administered 2022-10-08: 1 via ORAL
  Filled 2022-10-08: qty 1

## 2022-10-08 MED ORDER — AMOXICILLIN-POT CLAVULANATE 875-125 MG PO TABS: 1.0000 | ORAL_TABLET | Freq: Once | ORAL | Status: DC

## 2022-10-08 NOTE — ED Triage Notes (Signed)
Pt c/o tailbone pain s/p fall today

## 2022-10-08 NOTE — ED Notes (Signed)
Patient's taxi voucher given to [employer] with security. Patient placed in lobby, security is aware she is there.

## 2022-10-08 NOTE — Discharge Instructions (Signed)
Please follow-up with your primary care doctor, and use ice, Tylenol, ibuprofen for pain control.  If you have loss of sensation to your legs, loss of bowel, bladder please return to the ER.

## 2022-10-08 NOTE — ED Provider Notes (Signed)
[facility] Provider Note   CSN: [id number] Arrival date & time: 10/08/22  1456     History  Chief Complaint  Patient presents with   [name] is a 51 y.o. female, no pertinent past medical history, who complains of buttocks and low back pain is been going on since she fell after slipping on something earlier today.  She complains of low back pain, and pelvic pain.  Is not have any problems ambulating, just states it hurts in the back.  Denies any kind of head trauma, chest wall trauma, or abdominal trauma.  Has no other complaints.  Not on any blood thinners.     Home Medications Prior to Admission medications   Medication Sig Start Date End Date Taking? Authorizing Provider  benzonatate (TESSALON) 100 MG capsule Take 1 capsule (100 mg total) by mouth every 8 (eight) hours. 09/15/22   Suzy Bouchard, PA-C  diclofenac sodium (VOLTAREN) 1 % GEL Apply 2 g topically 4 (four) times daily. 08/11/19   Caccavale, Sophia, PA-C  lidocaine (LIDODERM) 5 % Place 1 patch onto the skin daily. Remove & Discard patch within 12 hours or as directed by MD 07/14/20   Randal Buba, April, MD  ondansetron (ZOFRAN ODT) 4 MG disintegrating tablet Take 1 tablet (4 mg total) by mouth every 8 (eight) hours as needed for nausea or vomiting. 04/04/20   Recardo Evangelist, PA-C  potassium chloride SA (KLOR-CON) 20 MEQ tablet Take 2 tablets (40 mEq total) by mouth daily. 04/04/20   Recardo Evangelist, PA-C  topiramate (TOPAMAX) 50 MG tablet Topamax 50 mg tablet  Take 1 tablet twice a day by oral route.    [provider]      Allergies    Midazolam and Midazolam hcl    Review of Systems   Review of Systems  Musculoskeletal:  Positive for back pain. Negative for neck pain.    Physical Exam Updated Vital Signs BP (!) 167/113   Pulse 91   Temp 98.4 F (36.9 C)   Resp 16   Ht '5\' 1"'$  (1.549 m)   Wt 59 kg   LMP 09/09/2022 (Exact Date) Comment:  negative HCG  SpO2 100%   BMI 24.56 kg/m  Physical Exam Vitals and nursing note reviewed.  Constitutional:      General: She is not in acute distress.    Appearance: She is well-developed.  HENT:     Head: Normocephalic and atraumatic.  Eyes:     Conjunctiva/sclera: Conjunctivae normal.  Cardiovascular:     Rate and Rhythm: Normal rate and regular rhythm.     Heart sounds: No murmur heard. Pulmonary:     Effort: Pulmonary effort is normal. No respiratory distress.     Breath sounds: Normal breath sounds.  Abdominal:     Palpations: Abdomen is soft.     Tenderness: There is no abdominal tenderness.  Musculoskeletal:        General: No swelling.     Cervical back: Neck supple.     Comments: Tenderness to palpation of right PSIS, tenderness to palpation of midline L2 without step-off.  Range of motion intact, able to ambulate.  Skin:    General: Skin is warm and dry.     Capillary Refill: Capillary refill takes less than 2 seconds.  Neurological:     Mental Status: She is alert.  Psychiatric:        Mood and Affect: Mood normal.  ED Results / Procedures / Treatments   Labs (all labs ordered are listed, but only abnormal results are displayed) Labs Reviewed  PREGNANCY, URINE    EKG None  Radiology CT Lumbar Spine Wo Contrast  Result Date: 10/08/2022 CLINICAL DATA:  Low back pain, fall EXAM: CT LUMBAR SPINE WITHOUT CONTRAST TECHNIQUE: Multidetector CT imaging of the lumbar spine was performed without intravenous contrast administration. Multiplanar CT image reconstructions were also generated. RADIATION DOSE REDUCTION: This exam was performed according to the departmental dose-optimization program which includes automated exposure control, adjustment of the mA and/or kV according to patient size and/or use of iterative reconstruction technique. COMPARISON:  No prior CT of the lumbar spine available, correlation is made with CT abdomen pelvis 02/06/2011 FINDINGS:  Segmentation: 5 lumbar type vertebrae. Alignment: No listhesis. Vertebrae: No acute fracture or focal pathologic process. Paraspinal and other soft tissues: Negative. Disc levels: Mild degenerative changes without high-grade spinal canal stenosis or neural foraminal narrowing. IMPRESSION: 1. No acute fracture or traumatic malalignment of the lumbar spine. 2. Mild degenerative changes without high-grade spinal canal stenosis or neural foraminal narrowing. Electronically Signed   By: Merilyn Baba M.D.   On: 10/08/2022 19:01   DG Sacrum/Coccyx  Result Date: 10/08/2022 CLINICAL DATA:  [place], pain at tailbone EXAM: SACRUM AND COCCYX - 2+ VIEW COMPARISON:  None Available. FINDINGS: Frontal and lateral views of the sacrum and coccyx are obtained. There are no acute displaced fractures. Alignment is anatomic. Sacroiliac joints are normal. Visualized portions of the bony pelvis are unremarkable. IMPRESSION: 1. Normal sacrum and coccyx.  No evidence of fracture. Electronically Signed   By: Randa Ngo M.D.   On: 10/08/2022 15:45    Procedures Procedures    Medications Ordered in ED Medications  lidocaine (LIDODERM) 5 % 1 patch (1 patch Transdermal Patch Applied 10/08/22 1743)  HYDROcodone-acetaminophen (NORCO/VICODIN) 5-325 MG per tablet 1 tablet (1 tablet Oral Given 10/08/22 1744)    ED Course/ Medical Decision Making/ A&P                           Medical Decision Making 51 year old female, here for follow-up of her where she actually slipped on something, and fell on her behind.  She has lumbar midline tenderness to palpation and PSIS tenderness to palpation.  We will obtain pelvic x-ray, and CT lumbar spine given tenderness to palpation midline.  Amount and/or Complexity of Data Reviewed Labs: ordered. Radiology: ordered.    Details: CT lumbar shows no findings of any fractures, normal sacrum and coccyx.  Normal lumbar spine except for mild arthritis. Discussion of management or test  interpretation with external provider(s): Discussed with patient no acute findings, likely contusion, discussed his Tylenol ibuprofen icing the area, and return precautions for low back pain.  Patient voiced understanding, work note provided.  Risk Prescription drug management.    Final Clinical Impression(s) / ED Diagnoses Final diagnoses:  Fall, initial encounter  Acute midline low back pain without sciatica  Contusion of buttock, initial encounter    Rx / DC Orders ED Discharge Orders     None         Wrigley Plasencia, Si Gaul, PA 10/08/22 Doran Heater    Fredia Sorrow, MD 10/14/22 1704

## 2023-08-28 ENCOUNTER — Emergency Department (HOSPITAL_BASED_OUTPATIENT_CLINIC_OR_DEPARTMENT_OTHER): Payer: Medicaid Other

## 2023-08-28 ENCOUNTER — Other Ambulatory Visit: Payer: Self-pay

## 2023-08-28 ENCOUNTER — Emergency Department (HOSPITAL_BASED_OUTPATIENT_CLINIC_OR_DEPARTMENT_OTHER)
Admission: EM | Admit: 2023-08-28 | Discharge: 2023-08-29 | Disposition: A | Discharge status: Home / Self Care | Payer: Medicaid Other | Attending: Emergency Medicine | Admitting: Emergency Medicine

## 2023-08-28 DIAGNOSIS — R059 Cough, unspecified: Secondary | ICD-10-CM | POA: Diagnosis present

## 2023-08-28 DIAGNOSIS — J069 Acute upper respiratory infection, unspecified: Secondary | ICD-10-CM | POA: Diagnosis not present

## 2023-08-28 DIAGNOSIS — R030 Elevated blood-pressure reading, without diagnosis of hypertension: Secondary | ICD-10-CM

## 2023-08-28 DIAGNOSIS — Z20822 Contact with and (suspected) exposure to covid-19: Secondary | ICD-10-CM | POA: Diagnosis not present

## 2023-08-28 LAB — RESP PANEL BY RT-PCR (RSV, FLU A&B, COVID)  RVPGX2
Influenza A by PCR: NEGATIVE
Influenza B by PCR: NEGATIVE
Resp Syncytial Virus by PCR: NEGATIVE
SARS Coronavirus 2 by RT PCR: NEGATIVE

## 2023-08-28 NOTE — ED Triage Notes (Signed)
PtPOV steady gait- c/o cough, congestion, fever, bodyaches, headache x4 days. Poor po intake.   Took 800 mg ibuprofen appx 1530.

## 2023-08-29 MED ORDER — ALBUTEROL SULFATE HFA 108 (90 BASE) MCG/ACT IN AERS
2.0000 | INHALATION_SPRAY | RESPIRATORY_TRACT | Status: DC | PRN
  Administered 2023-08-29: 2 via RESPIRATORY_TRACT
  Filled 2023-08-29: qty 6.7

## 2023-08-29 MED ORDER — IPRATROPIUM-ALBUTEROL 0.5-2.5 (3) MG/3ML IN SOLN
3.0000 mL | RESPIRATORY_TRACT | Status: AC
  Administered 2023-08-29: 3 mL via RESPIRATORY_TRACT
  Filled 2023-08-29: qty 3

## 2023-08-29 MED ORDER — AZITHROMYCIN 250 MG PO TABS: 250.0000 mg | ORAL_TABLET | Freq: Every day | ORAL | 0 refills | Status: DC

## 2023-08-29 NOTE — ED Notes (Signed)
Pt able to walk around ED w no complaint of SHOB or lightheadedness. Highest HR reached was 96. Lowest O2 sat was 94%

## 2023-08-29 NOTE — Discharge Instructions (Addendum)
You were seen in the ER for evaluation of your cough and cold symptoms. Your xray does not shows pneumonia, but given your symptoms as well as your childrens, I am going to send you in an antibiotic for this. Please take as prescribed. You can use the albuterol inhaler and spacer as needed to help with your cough symptoms. You can also try over the counter Robitussin. Please follow up with your PCP for re-evaluation. Additionally, your blood pressure is elevated. I have included a log for you to record your blood pressures on as you may need to be on medications., Please bring the form with you to your next PCP visit. I have included more information for you to review. If you have any concerns, new or worsening symptoms, please return to your PCP for re-evaluation.   Contact a doctor if: You are getting worse, not better. You have any of these: A fever or chills. Brown or red mucus in your nose. Yellow or brown fluid (discharge)coming from your nose. Pain in your face, especially when you bend forward. Swollen neck glands. Pain when you swallow. White areas in the back of your throat. Get help right away if: You have shortness of breath that gets worse. You have very bad or constant: Headache. Ear pain. Pain in your forehead, behind your eyes, and over your cheekbones (sinus pain). Chest pain. You have long-lasting (chronic) lung disease along with any of these: Making high-pitched whistling sounds when you breathe, most often when you breathe out (wheezing). Long-lasting cough (more than 14 days). Coughing up blood. A change in your usual mucus. You have a stiff neck. You have changes in your: Vision. Hearing. Thinking. Mood. These symptoms may be an emergency. Get help right away. Call 911. Do not wait to see if the symptoms will go away. Do not drive yourself to the hospital.

## 2023-08-29 NOTE — ED Provider Notes (Signed)
[place] Provider Note   CSN: [id number] Arrival date & time: 08/28/23  1911     History Chief Complaint  Patient presents with   URI    [name] is a 52 y.o. female reportedly otherwise healthy presents to the ER today for evaluation of cough and cold symptoms for the past 10 days.  Multiple other members of the family are sick with similar symptoms.  She reports that she started with a dry cough that is occasionally productive with clear to green mucus.  Reports diffuse bodyaches as well as some rhinorrhea and nasal congestion.  She denies any chest pain or shortness of breath.  Denies any nausea, vomiting, abdominal pain.  She reports that she did have a fever yesterday at 101.4 Fahrenheit but has not had one since.  She also ports a headache while coughing.  Denies any neck stiffness or any blurry vision.  She denies any medical or surgical history.  Denies any daily medications.  Allergic to Versed.  Denies any tobacco, EtOH, licit drug use.   URI Presenting symptoms: congestion, cough, fever and rhinorrhea   Presenting symptoms: no ear pain and no sore throat   Associated symptoms: myalgias   Associated symptoms: no arthralgias        Home Medications Prior to Admission medications   Medication Sig Start Date End Date Taking? Authorizing Provider  benzonatate (TESSALON) 100 MG capsule Take 1 capsule (100 mg total) by mouth every 8 (eight) hours. 09/15/22   Mannie Stabile, PA-C  diclofenac sodium (VOLTAREN) 1 % GEL Apply 2 g topically 4 (four) times daily. 08/11/19   Caccavale, Sophia, PA-C  lidocaine (LIDODERM) 5 % Place 1 patch onto the skin daily. Remove & Discard patch within 12 hours or as directed by MD 07/14/20   Nicanor Alcon, April, MD  ondansetron (ZOFRAN ODT) 4 MG disintegrating tablet Take 1 tablet (4 mg total) by mouth every 8 (eight) hours as needed for nausea or vomiting. 04/04/20   Bethel Born, PA-C   potassium chloride SA (KLOR-CON) 20 MEQ tablet Take 2 tablets (40 mEq total) by mouth daily. 04/04/20   Bethel Born, PA-C  topiramate (TOPAMAX) 50 MG tablet Topamax 50 mg tablet  Take 1 tablet twice a day by oral route.    [provider]      Allergies    Midazolam and Midazolam hcl    Review of Systems   Review of Systems  Constitutional:  Positive for fever. Negative for chills.  HENT:  Positive for congestion and rhinorrhea. Negative for drooling, ear pain, sore throat and trouble swallowing.   Eyes:  Negative for photophobia, discharge and visual disturbance.  Respiratory:  Positive for cough. Negative for shortness of breath.   Cardiovascular:  Negative for chest pain and palpitations.  Gastrointestinal:  Negative for abdominal pain, diarrhea, nausea and vomiting.  Genitourinary:  Negative for dysuria and hematuria.  Musculoskeletal:  Positive for myalgias. Negative for arthralgias, back pain and joint swelling.  Neurological:  Negative for syncope and weakness.    Physical Exam Updated Vital Signs BP (!) 166/100 (BP Location: Right Arm)   Pulse 87   Temp 98.5 F (36.9 C) (Oral)   Resp 19   Ht 5\' 1"  (1.549 m)   Wt 63.5 kg   LMP 08/18/2023 (Approximate)   SpO2 100%   BMI 26.45 kg/m  Physical Exam Vitals and nursing note reviewed.  Constitutional:      General:  She is not in acute distress.    Appearance: She is not toxic-appearing.  HENT:     Head: Normocephalic and atraumatic.     Right Ear: Tympanic membrane, ear canal and external ear normal.     Left Ear: Tympanic membrane, ear canal and external ear normal.     Nose:     Comments: Bilateral nasal turbinate edema and erythema with scant clear nasal discharge.    Mouth/Throat:     Mouth: Mucous membranes are moist.     Comments: No pharyngeal erythema, exudate, or edema noted.  Uvula midline.  Airway patent.  Moist mucous membranes. Eyes:     General: No scleral icterus.     Conjunctiva/sclera: Conjunctivae normal.  Cardiovascular:     Rate and Rhythm: Normal rate.  Pulmonary:     Effort: Pulmonary effort is normal. No respiratory distress.     Breath sounds: No stridor.     Comments: Recently coughing during exam.  Lung sounds are slightly diminished at the bases but no wheeze auscultated. Abdominal:     General: Abdomen is flat.     Palpations: Abdomen is soft.     Tenderness: There is no abdominal tenderness. There is no guarding or rebound.  Musculoskeletal:        General: No deformity.     Cervical back: Normal range of motion. No rigidity.  Lymphadenopathy:     Cervical: No cervical adenopathy.  Skin:    General: Skin is warm and dry.  Neurological:     General: No focal deficit present.     Mental Status: She is alert.     ED Results / Procedures / Treatments   Labs (all labs ordered are listed, but only abnormal results are displayed) Labs Reviewed  RESP PANEL BY RT-PCR (RSV, FLU A&B, COVID)  [id number]    EKG None  Radiology DG Chest 2 View  Result Date: 08/28/2023 CLINICAL DATA:  Cough EXAM: CHEST - 2 VIEW COMPARISON:  None Available. FINDINGS: The heart size and mediastinal contours are within normal limits. Both lungs are clear. The visualized skeletal structures are unremarkable. IMPRESSION: No active cardiopulmonary disease. Electronically Signed   By: Helyn Numbers M.D.   On: 08/28/2023 23:55    Procedures Procedures   Medications Ordered in ED Medications  albuterol (VENTOLIN HFA) 108 (90 Base) MCG/ACT inhaler 2 puff (2 puffs Inhalation Given 08/29/23 0108)  ipratropium-albuterol (DUONEB) 0.5-2.5 (3) MG/3ML nebulizer solution 3 mL (3 mLs Nebulization Given 08/29/23 0035)    ED Course/ Medical Decision Making/ A&P                              Medical Decision Making Amount and/or Complexity of Data Reviewed Radiology: ordered.  Risk Prescription drug management.   52 y.o. female presents to the ER for evaluation of  cough and cold symptoms. Differential diagnosis includes but is not limited to Upper respiratory infection, lower respiratory infection, allergies, asthma, irritants, foreign body, medications (ACE inhibitors), reflux, CHF, lung cancer, interstitial lung disease, psychiatric causes, postnasal drip. Vital signs elevated BP at 166/100, otherwise unremarkable. Afebrile. Physical exam as noted above.   I independently reviewed and interpreted the patient's labs.  COVID, flu, RSV negative.  Chest x-ray shows No active cardiopulmonary disease. Per radiologist's interpretation.   The patient was given duoneb here and albuterol inhaler and spacer to go home with. She feels that her coughing has improved since the breathing treatments.  I discussed with the patient that she has been taking her blood pressure at home given her elevated today.  May just be elevated because she is not feeling well however discussed with her that she will need to follow with her primary care doctor if she continues to have elevated blood pressures.  Given that she has had prolonged symptoms, will treat for atypical pneumonia with azithromycin.  Otherwise, she is hemodynamically stable.  Maintains her O2 saturations with ambulation.  She is not complaining about any chest pain or shortness of breath. She does not appear in any acute distress.  Lung sounds have improved after breathing treatments as well.  Will discharge her home with close outpatient follow-up for resolution of symptoms.  We discussed the results of the labs/imaging. The plan is take antibiotics as prescribed, over-the-counter cough medication, follow-up PCP. We discussed strict return precautions and red flag symptoms. The patient verbalized their understanding and agrees to the plan. The patient is stable and being discharged home in good condition.  Portions of this report may have been transcribed using voice recognition software. Every effort was made to ensure  accuracy; however, inadvertent computerized transcription errors may be present.   Final Clinical Impression(s) / ED Diagnoses Final diagnoses:  Upper respiratory tract infection, unspecified type    Rx / DC Orders ED Discharge Orders          Ordered    azithromycin (ZITHROMAX) 250 MG tablet  Daily        08/29/23 0130              Achille Rich, PA-C 08/29/23 1842    Sloan Leiter, DO 08/30/23 0000

## 2023-09-21 ENCOUNTER — Encounter (HOSPITAL_BASED_OUTPATIENT_CLINIC_OR_DEPARTMENT_OTHER): Payer: Self-pay | Admitting: Emergency Medicine

## 2023-09-21 ENCOUNTER — Emergency Department (HOSPITAL_BASED_OUTPATIENT_CLINIC_OR_DEPARTMENT_OTHER)
Admission: EM | Admit: 2023-09-21 | Discharge: 2023-09-21 | Disposition: A | Discharge status: Home / Self Care | Payer: Medicaid Other | Attending: Emergency Medicine | Admitting: Emergency Medicine

## 2023-09-21 ENCOUNTER — Emergency Department (HOSPITAL_BASED_OUTPATIENT_CLINIC_OR_DEPARTMENT_OTHER): Payer: Medicaid Other

## 2023-09-21 DIAGNOSIS — M79674 Pain in right toe(s): Secondary | ICD-10-CM | POA: Insufficient documentation

## 2023-09-21 DIAGNOSIS — W108XXA Fall (on) (from) other stairs and steps, initial encounter: Secondary | ICD-10-CM | POA: Diagnosis not present

## 2023-09-21 DIAGNOSIS — M79671 Pain in right foot: Secondary | ICD-10-CM | POA: Insufficient documentation

## 2023-09-21 MED ORDER — HYDROCODONE-ACETAMINOPHEN 5-325 MG PO TABS
1.0000 | ORAL_TABLET | Freq: Once | ORAL | Status: AC
  Administered 2023-09-21: 1 via ORAL
  Filled 2023-09-21: qty 1

## 2023-09-21 NOTE — ED Triage Notes (Signed)
Pt fell down 2 steps yesterday and now c/o pain to RT 2nd toe

## 2023-09-21 NOTE — Discharge Instructions (Signed)
Your x-ray did not show any fracture but did show a slight erosion of your first metatarsal head on the right foot which could represent gout.  He should follow-up with podiatrist or orthopedic doctor for this.  He should also follow with your doctor.  He can take Tylenol, Motrin for pain.  Return for new or worsening symptoms.

## 2023-09-21 NOTE — ED Provider Notes (Signed)
[place] [facility] Provider Note   CSN: [id number] Arrival date & time: 09/21/23  1611     History  Chief Complaint  Patient presents with   Toe Pain    [name] is a 52 y.o. female.   Toe Pain  52 year old female presenting for right second toe pain.  Last night she fell up 2 steps and hit her right foot.  She has pain to the right second toe.  Also some mild foot pain.  She skinned her knee but has no pain here.  Did not hit her head.  No headache or neck pain or chest pain or back pain or abdominal pain.  No pain to her arms or left leg.  Not on blood thinners.     Home Medications Prior to Admission medications   Medication Sig Start Date End Date Taking? Authorizing Provider  azithromycin (ZITHROMAX) 250 MG tablet Take 1 tablet (250 mg total) by mouth daily. Take first 2 tablets together, then 1 every day until finished. 08/29/23   Annette Rich, PA-C  benzonatate (TESSALON) 100 MG capsule Take 1 capsule (100 mg total) by mouth every 8 (eight) hours. 09/15/22   Annette Stabile, PA-C  diclofenac sodium (VOLTAREN) 1 % GEL Apply 2 g topically 4 (four) times daily. 08/11/19   Reed, Sophia, PA-C  lidocaine (LIDODERM) 5 % Place 1 patch onto the skin daily. Remove & Discard patch within 12 hours or as directed by MD 07/14/20   [name], April, MD  ondansetron (ZOFRAN ODT) 4 MG disintegrating tablet Take 1 tablet (4 mg total) by mouth every 8 (eight) hours as needed for nausea or vomiting. 04/04/20   Annette Born, PA-C  potassium chloride SA (KLOR-CON) 20 MEQ tablet Take 2 tablets (40 mEq total) by mouth daily. 04/04/20   Annette Born, PA-C  topiramate (TOPAMAX) 50 MG tablet Topamax 50 mg tablet  Take 1 tablet twice a day by oral route.    [provider]      Allergies    Midazolam and Midazolam hcl    Review of Systems   Review of Systems Review of systems completed and notable as per HPI.  ROS otherwise  negative.  Physical Exam Updated Vital Signs BP (!) 155/96 (BP Location: Left Arm)   Pulse 95   Temp 98.3 F (36.8 C)   Resp 18   Ht 5\' 1"  (1.549 m)   Wt 63.5 kg   LMP 09/09/2023   SpO2 100%   BMI 26.45 kg/m  Physical Exam Vitals and nursing note reviewed.  Constitutional:      General: She is not in acute distress.    Appearance: She is well-developed.  HENT:     Head: Normocephalic and atraumatic.  Eyes:     Conjunctiva/sclera: Conjunctivae normal.  Cardiovascular:     Rate and Rhythm: Normal rate and regular rhythm.     Pulses: Normal pulses.     Heart sounds: Normal heart sounds. No murmur heard. Pulmonary:     Effort: Pulmonary effort is normal. No respiratory distress.     Breath sounds: Normal breath sounds.  Abdominal:     Palpations: Abdomen is soft.     Tenderness: There is no abdominal tenderness.  Musculoskeletal:        General: No swelling.     Cervical back: Neck supple.     Comments: Mild tenderness and pain with range of motion of the right second toe.  No  skin changes.  Normal alignment.  Normal capillary refill.  Palpable DP and PT pulse.  She is normal strength at the knee, ankle, toes.  Normal sensation.  Skin:    General: Skin is warm and dry.     Capillary Refill: Capillary refill takes less than 2 seconds.  Neurological:     Mental Status: She is alert.  Psychiatric:        Mood and Affect: Mood normal.     ED Results / Procedures / Treatments   Labs (all labs ordered are listed, but only abnormal results are displayed) Labs Reviewed - No data to display  EKG None  Radiology DG Foot Complete Right  Result Date: 09/21/2023 CLINICAL DATA:  Injury to the right foot in the second toe after tripping over stairs EXAM: RIGHT FOOT COMPLETE - 3 VIEW COMPARISON:  None Available. FINDINGS: There is no evidence of fracture or dislocation. Well-circumscribed lucency along the medial aspect of the first metatarsal head. Soft tissues are  unremarkable. IMPRESSION: 1. No acute fracture or dislocation. 2. Well-circumscribed lucency along the medial first metatarsal head may reflect juxta-articular erosion, which can be seen in the setting of gout. Electronically Signed   By: [name] M.D.   On: 09/21/2023 17:26    Procedures Procedures    Medications Ordered in ED Medications  HYDROcodone-acetaminophen (NORCO/VICODIN) 5-325 MG per tablet 1 tablet (1 tablet Oral Given 09/21/23 1639)    ED Course/ Medical Decision Making/ A&P                                 Medical Decision Making Amount and/or Complexity of Data Reviewed Radiology: ordered.  Risk Prescription drug management.   Medical Decision Making:   [name] is a 52 y.o. female who presented to the ED today with right foot pain and toe pain after fall.  All signs reviewed.  She is tender and has range of motion pain of the right second toe.  Suspect possible fracture versus soft tissue injury.  Closed.  No wound.  Obtain x-ray.   Patient placed on continuous vitals and telemetry monitoring while in ED which was reviewed periodically.  Reviewed and confirmed nursing documentation for past medical history, family history, social history.  Reassessment and Plan:   X-ray without fracture.  Noted to have possible findings of gout.  She has no pain here acutely, no history of gout.  No fevers or signs of osteomyelitis.  Recommend she follow-up with podiatry or PCP.  She is comfortable this plan.  Return precautions given.   Patient's presentation is most consistent with acute complicated illness / injury requiring diagnostic workup.           Final Clinical Impression(s) / ED Diagnoses Final diagnoses:  Right foot pain    Rx / DC Orders ED Discharge Orders     None         Annette Spates, MD 09/21/23 2158

## 2023-10-20 ENCOUNTER — Emergency Department (HOSPITAL_BASED_OUTPATIENT_CLINIC_OR_DEPARTMENT_OTHER)
Admission: EM | Admit: 2023-10-20 | Discharge: 2023-10-21 | Disposition: A | Discharge status: Home / Self Care | Payer: Medicaid Other | Attending: Emergency Medicine | Admitting: Emergency Medicine

## 2023-10-20 ENCOUNTER — Encounter (HOSPITAL_BASED_OUTPATIENT_CLINIC_OR_DEPARTMENT_OTHER): Payer: Self-pay

## 2023-10-20 ENCOUNTER — Other Ambulatory Visit: Payer: Self-pay

## 2023-10-20 DIAGNOSIS — M549 Dorsalgia, unspecified: Secondary | ICD-10-CM | POA: Insufficient documentation

## 2023-10-20 DIAGNOSIS — Z5321 Procedure and treatment not carried out due to patient leaving prior to being seen by health care provider: Secondary | ICD-10-CM | POA: Insufficient documentation

## 2023-10-20 NOTE — ED Triage Notes (Signed)
Pt arrives with c/o upper back pain that started about 2 days ago. Pt denies injury.

## 2023-12-16 ENCOUNTER — Emergency Department (HOSPITAL_BASED_OUTPATIENT_CLINIC_OR_DEPARTMENT_OTHER): Payer: Medicaid Other

## 2023-12-16 ENCOUNTER — Other Ambulatory Visit: Payer: Self-pay

## 2023-12-16 ENCOUNTER — Encounter (HOSPITAL_BASED_OUTPATIENT_CLINIC_OR_DEPARTMENT_OTHER): Payer: Self-pay | Admitting: Emergency Medicine

## 2023-12-16 ENCOUNTER — Emergency Department (HOSPITAL_BASED_OUTPATIENT_CLINIC_OR_DEPARTMENT_OTHER)
Admission: EM | Admit: 2023-12-16 | Discharge: 2023-12-17 | Disposition: A | Discharge status: Home / Self Care | Payer: Medicaid Other | Attending: Emergency Medicine | Admitting: Emergency Medicine

## 2023-12-16 DIAGNOSIS — I10 Essential (primary) hypertension: Secondary | ICD-10-CM | POA: Insufficient documentation

## 2023-12-16 DIAGNOSIS — Z79899 Other long term (current) drug therapy: Secondary | ICD-10-CM | POA: Diagnosis not present

## 2023-12-16 DIAGNOSIS — R519 Headache, unspecified: Secondary | ICD-10-CM | POA: Diagnosis not present

## 2023-12-16 DIAGNOSIS — Z853 Personal history of malignant neoplasm of breast: Secondary | ICD-10-CM | POA: Diagnosis not present

## 2023-12-16 DIAGNOSIS — R1084 Generalized abdominal pain: Secondary | ICD-10-CM | POA: Insufficient documentation

## 2023-12-16 DIAGNOSIS — R112 Nausea with vomiting, unspecified: Secondary | ICD-10-CM

## 2023-12-16 DIAGNOSIS — E876 Hypokalemia: Secondary | ICD-10-CM | POA: Diagnosis not present

## 2023-12-16 LAB — CBC
HCT: 35.7 % — ABNORMAL LOW (ref 36.0–46.0)
Hemoglobin: 12.1 g/dL (ref 12.0–15.0)
MCH: 29.2 pg (ref 26.0–34.0)
MCHC: 33.9 g/dL (ref 30.0–36.0)
MCV: 86 fL (ref 80.0–100.0)
Platelets: 234 10*3/uL (ref 150–400)
RBC: 4.15 MIL/uL (ref 3.87–5.11)
RDW: 13.8 % (ref 11.5–15.5)
WBC: 6.2 10*3/uL (ref 4.0–10.5)
nRBC: 0 % (ref 0.0–0.2)

## 2023-12-16 LAB — COMPREHENSIVE METABOLIC PANEL
ALT: 37 U/L (ref 0–44)
AST: 30 U/L (ref 15–41)
Albumin: 4 g/dL (ref 3.5–5.0)
Alkaline Phosphatase: 73 U/L (ref 38–126)
Anion gap: 11 (ref 5–15)
BUN: 16 mg/dL (ref 6–20)
CO2: 22 mmol/L (ref 22–32)
Calcium: 8.5 mg/dL — ABNORMAL LOW (ref 8.9–10.3)
Chloride: 102 mmol/L (ref 98–111)
Creatinine, Ser: 0.69 mg/dL (ref 0.44–1.00)
GFR, Estimated: >60 mL/min (ref >60)
Glucose, Bld: 94 mg/dL (ref 70–99)
Potassium: 2.8 mmol/L — ABNORMAL LOW (ref 3.5–5.1)
Sodium: 135 mmol/L (ref 135–145)
Total Bilirubin: 0.9 mg/dL (ref 0.0–1.2)
Total Protein: 7.1 g/dL (ref 6.5–8.1)

## 2023-12-16 LAB — URINALYSIS, ROUTINE W REFLEX MICROSCOPIC
Bilirubin Urine: NEGATIVE
Glucose, UA: NEGATIVE mg/dL
Ketones, ur: NEGATIVE mg/dL
Leukocytes,Ua: NEGATIVE
Nitrite: NEGATIVE
Protein, ur: NEGATIVE mg/dL
Specific Gravity, Urine: 1.02 (ref 1.005–1.030)
pH: 7 (ref 5.0–8.0)

## 2023-12-16 LAB — PREGNANCY, URINE: Preg Test, Ur: NEGATIVE

## 2023-12-16 LAB — URINALYSIS, MICROSCOPIC (REFLEX)

## 2023-12-16 LAB — LIPASE, BLOOD: Lipase: 21 U/L (ref 11–51)

## 2023-12-16 MED ORDER — MAGNESIUM SULFATE 2 GM/50ML IV SOLN
2.0000 g | Freq: Once | INTRAVENOUS | Status: AC
  Administered 2023-12-16: 2 g via INTRAVENOUS
  Filled 2023-12-16: qty 50

## 2023-12-16 MED ORDER — IOHEXOL 300 MG/ML  SOLN
100.0000 mL | Freq: Once | INTRAMUSCULAR | Status: AC | PRN
  Administered 2023-12-17: 100 mL via INTRAVENOUS

## 2023-12-16 MED ORDER — POTASSIUM CHLORIDE CRYS ER 20 MEQ PO TBCR
80.0000 meq | EXTENDED_RELEASE_TABLET | Freq: Once | ORAL | Status: AC
  Administered 2023-12-16: 80 meq via ORAL
  Filled 2023-12-16: qty 4

## 2023-12-16 NOTE — ED Triage Notes (Signed)
 Patient presents with abd pain/cramps, nausea, vomiting and diarrhea since yesterday morning. Reports she was evaluated at Urgent care earlier today and advised to come to ED. States "they said it was probably a stomach bug".  Patient medicated with IM Toradol with no relief

## 2023-12-16 NOTE — ED Notes (Signed)
 Pt was seen at UC, dx with a "stomach bug" Pt has been sick with N/V/D x 2 days Experiencing cramps, back pain and HA

## 2023-12-17 MED ORDER — ONDANSETRON HCL 4 MG/2ML IJ SOLN
4.0000 mg | Freq: Once | INTRAMUSCULAR | Status: AC
  Administered 2023-12-17: 4 mg via INTRAVENOUS
  Filled 2023-12-17: qty 2

## 2023-12-17 MED ORDER — POTASSIUM CHLORIDE CRYS ER 20 MEQ PO TBCR: 20.0000 meq | EXTENDED_RELEASE_TABLET | Freq: Two times a day (BID) | ORAL | 0 refills | Status: DC

## 2023-12-17 MED ORDER — KETOROLAC TROMETHAMINE 30 MG/ML IJ SOLN
30.0000 mg | Freq: Once | INTRAMUSCULAR | Status: AC
  Administered 2023-12-17: 30 mg via INTRAVENOUS
  Filled 2023-12-17: qty 1

## 2023-12-17 MED ORDER — SODIUM CHLORIDE 0.9 % IV BOLUS
500.0000 mL | Freq: Once | INTRAVENOUS | Status: AC
  Administered 2023-12-17: 500 mL via INTRAVENOUS

## 2023-12-17 NOTE — ED Provider Notes (Signed)
 [place] [facility] Provider Note   CSN: [id number] Arrival date & time: 12/16/23  1921     History  Chief Complaint  Patient presents with   Abdominal Pain   Emesis   Diarrhea    [name] is a 53 y.o. female.  The history is provided by the patient.  Abdominal Pain Pain location:  Generalized Pain quality: cramping   Pain radiates to:  Does not radiate Pain severity:  Moderate Onset quality:  Gradual Duration:  2 days Timing:  Constant Progression:  Waxing and waning Chronicity:  New Context comment:  Nausea vomiting and diarrhea Relieved by:  Nothing Worsened by:  Nothing Ineffective treatments:  None tried Associated symptoms: diarrhea and vomiting   Associated symptoms: no fever   Emesis Associated symptoms: abdominal pain, diarrhea and headaches   Associated symptoms: no fever   Diarrhea Associated symptoms: abdominal pain, headaches and vomiting   Associated symptoms: no fever   Patient with HTN presents with nausea vomiting and diarrhea and headache.  Seen at urgent care and diagnosed with stomach virus.   Past Medical History:  Diagnosis Date   Breast cancer (HCC)    Hypertension    Migraine    UTI (lower urinary tract infection)        Home Medications Prior to Admission medications   Medication Sig Start Date End Date Taking? Authorizing Provider  potassium chloride SA (KLOR-CON M) 20 MEQ tablet Take 1 tablet (20 mEq total) by mouth 2 (two) times daily. 12/17/23  Yes Bergen Magner, MD  azithromycin (ZITHROMAX) 250 MG tablet Take 1 tablet (250 mg total) by mouth daily. Take first 2 tablets together, then 1 every day until finished. 08/29/23   Achille Rich, PA-C  benzonatate (TESSALON) 100 MG capsule Take 1 capsule (100 mg total) by mouth every 8 (eight) hours. 09/15/22   Mannie Stabile, PA-C  diclofenac sodium (VOLTAREN) 1 % GEL Apply 2 g topically 4 (four) times daily. 08/11/19   Caccavale,  Sophia, PA-C  lidocaine (LIDODERM) 5 % Place 1 patch onto the skin daily. Remove & Discard patch within 12 hours or as directed by MD 07/14/20   Nicanor Alcon, Taaj Hurlbut, MD  ondansetron (ZOFRAN ODT) 4 MG disintegrating tablet Take 1 tablet (4 mg total) by mouth every 8 (eight) hours as needed for nausea or vomiting. 04/04/20   Bethel Born, PA-C  potassium chloride SA (KLOR-CON) 20 MEQ tablet Take 2 tablets (40 mEq total) by mouth daily. 04/04/20   Bethel Born, PA-C  topiramate (TOPAMAX) 50 MG tablet Topamax 50 mg tablet  Take 1 tablet twice a day by oral route.    [provider]      Allergies    Midazolam and Midazolam hcl    Review of Systems   Review of Systems  Constitutional:  Negative for fever.  Eyes:  Negative for redness.  Respiratory:  Negative for wheezing and stridor.   Gastrointestinal:  Positive for abdominal pain, diarrhea and vomiting.  Neurological:  Positive for headaches.  All other systems reviewed and are negative.   Physical Exam Updated Vital Signs BP (!) 132/94   Pulse 69   Temp 99.3 F (37.4 C) (Oral)   Resp 16   Ht 5\' 1"  (1.549 m)   Wt 68 kg   SpO2 100%   BMI 28.34 kg/m  Physical Exam Vitals and nursing note reviewed.  Constitutional:      General: She is not in acute distress.  Appearance: Normal appearance. She is well-developed.  HENT:     Head: Normocephalic and atraumatic.     Nose: Nose normal.  Eyes:     Pupils: Pupils are equal, round, and reactive to light.  Cardiovascular:     Rate and Rhythm: Normal rate and regular rhythm.     Pulses: Normal pulses.     Heart sounds: Normal heart sounds.  Pulmonary:     Effort: Pulmonary effort is normal. No respiratory distress.     Breath sounds: Normal breath sounds.  Abdominal:     General: Abdomen is flat. Bowel sounds are normal. There is no distension.     Palpations: Abdomen is soft.     Tenderness: There is no abdominal tenderness. There is no guarding or rebound.   Genitourinary:    Vagina: No vaginal discharge.  Musculoskeletal:        General: Normal range of motion.     Cervical back: Neck supple.  Skin:    General: Skin is warm and dry.     Capillary Refill: Capillary refill takes less than 2 seconds.     Findings: No erythema or rash.  Neurological:     General: No focal deficit present.     Mental Status: She is alert and oriented to person, place, and time.     Deep Tendon Reflexes: Reflexes normal.  Psychiatric:        Mood and Affect: Mood normal.     ED Results / Procedures / Treatments   Labs (all labs ordered are listed, but only abnormal results are displayed) Results for orders placed or performed during the hospital encounter of 12/16/23  Urinalysis, Routine w reflex microscopic -Urine, Clean Catch   Collection Time: 12/16/23  7:33 PM  Result Value Ref Range   Color, Urine YELLOW YELLOW   APPearance CLEAR CLEAR   Specific Gravity, Urine 1.020 1.005 - 1.030   pH 7.0 5.0 - 8.0   Glucose, UA NEGATIVE NEGATIVE mg/dL   Hgb urine dipstick TRACE (A) NEGATIVE   Bilirubin Urine NEGATIVE NEGATIVE   Ketones, ur NEGATIVE NEGATIVE mg/dL   Protein, ur NEGATIVE NEGATIVE mg/dL   Nitrite NEGATIVE NEGATIVE   Leukocytes,Ua NEGATIVE NEGATIVE  Urinalysis, Microscopic (reflex)   Collection Time: 12/16/23  7:33 PM  Result Value Ref Range   RBC / HPF 0-5 0 - 5 RBC/hpf   WBC, UA 0-5 0 - 5 WBC/hpf   Bacteria, UA FEW (A) NONE SEEN   Squamous Epithelial / HPF 0-5 0 - 5 /HPF  Lipase, blood   Collection Time: 12/16/23  7:36 PM  Result Value Ref Range   Lipase 21 11 - 51 U/L  Comprehensive metabolic panel   Collection Time: 12/16/23  7:36 PM  Result Value Ref Range   Sodium 135 135 - 145 mmol/L   Potassium 2.8 (L) 3.5 - 5.1 mmol/L   Chloride 102 98 - 111 mmol/L   CO2 22 22 - 32 mmol/L   Glucose, Bld 94 70 - 99 mg/dL   BUN 16 6 - 20 mg/dL   Creatinine, Ser 1.61 0.44 - 1.00 mg/dL   Calcium 8.5 (L) 8.9 - 10.3 mg/dL   Total Protein 7.1  6.5 - 8.1 g/dL   Albumin 4.0 3.5 - 5.0 g/dL   AST 30 15 - 41 U/L   ALT 37 0 - 44 U/L   Alkaline Phosphatase 73 38 - 126 U/L   Total Bilirubin 0.9 0.0 - 1.2 mg/dL   GFR, Estimated >09 >60  mL/min   Anion gap 11 5 - 15  CBC   Collection Time: 12/16/23  7:36 PM  Result Value Ref Range   WBC 6.2 4.0 - 10.5 K/uL   RBC 4.15 3.87 - 5.11 MIL/uL   Hemoglobin 12.1 12.0 - 15.0 g/dL   HCT 40.9 (L) 81.1 - 91.4 %   MCV 86.0 80.0 - 100.0 fL   MCH 29.2 26.0 - 34.0 pg   MCHC 33.9 30.0 - 36.0 g/dL   RDW 78.2 95.6 - 21.3 %   Platelets 234 150 - 400 K/uL   nRBC 0.0 0.0 - 0.2 %  Pregnancy, urine   Collection Time: 12/16/23  7:36 PM  Result Value Ref Range   Preg Test, Ur NEGATIVE NEGATIVE   CT ABDOMEN PELVIS W CONTRAST Result Date: 12/17/2023 CLINICAL DATA:  Polytrauma, blunt.  Abdominal pain, nausea, vomiting EXAM: CT ABDOMEN AND PELVIS WITH CONTRAST TECHNIQUE: Multidetector CT imaging of the abdomen and pelvis was performed using the standard protocol following bolus administration of intravenous contrast. RADIATION DOSE REDUCTION: This exam was performed according to the departmental dose-optimization program which includes automated exposure control, adjustment of the mA and/or kV according to patient size and/or use of iterative reconstruction technique. CONTRAST:  OMNIPAQUE IOHEXOL 300 MG/ML  SOLN COMPARISON:  02/06/2011 FINDINGS: Lower chest: No acute abnormality Hepatobiliary: No focal liver abnormality is seen. Status post cholecystectomy. No biliary dilatation. Pancreas: No focal abnormality or ductal dilatation. Spleen: No focal abnormality.  Normal size. Adrenals/Urinary Tract: 2 mm nonobstructing stone in the upper pole of the right kidney. No ureteral stones or hydronephrosis. Adrenal glands and urinary bladder unremarkable. Stomach/Bowel: Normal appendix. Left colonic diverticulosis. No active diverticulitis. Stomach and small bowel decompressed. No evidence of obstruction or inflammatory  process. Vascular/Lymphatic: No evidence of aneurysm or adenopathy. Reproductive: Uterus and adnexa unremarkable.  No mass. Other: No free fluid or free air. Musculoskeletal: No acute bony abnormality. IMPRESSION: No acute findings in the abdomen or pelvis. Left colonic diverticulosis. Electronically Signed   By: Charlett Nose M.D.   On: 12/17/2023 00:28    EKG EKG Interpretation Date/Time:  Tuesday December 16 2023 23:42:14 EST Ventricular Rate:  67 PR Interval:  201 QRS Duration:  85 QT Interval:  416 QTC Calculation: 440 R Axis:   43  Text Interpretation: Sinus rhythm Confirmed by Atlas Crossland ([id number]) on 12/16/2023 11:43:51 PM  Radiology CT ABDOMEN PELVIS W CONTRAST Result Date: 12/17/2023 CLINICAL DATA:  Polytrauma, blunt.  Abdominal pain, nausea, vomiting EXAM: CT ABDOMEN AND PELVIS WITH CONTRAST TECHNIQUE: Multidetector CT imaging of the abdomen and pelvis was performed using the standard protocol following bolus administration of intravenous contrast. RADIATION DOSE REDUCTION: This exam was performed according to the departmental dose-optimization program which includes automated exposure control, adjustment of the mA and/or kV according to patient size and/or use of iterative reconstruction technique. CONTRAST:  OMNIPAQUE IOHEXOL 300 MG/ML  SOLN COMPARISON:  02/06/2011 FINDINGS: Lower chest: No acute abnormality Hepatobiliary: No focal liver abnormality is seen. Status post cholecystectomy. No biliary dilatation. Pancreas: No focal abnormality or ductal dilatation. Spleen: No focal abnormality.  Normal size. Adrenals/Urinary Tract: 2 mm nonobstructing stone in the upper pole of the right kidney. No ureteral stones or hydronephrosis. Adrenal glands and urinary bladder unremarkable. Stomach/Bowel: Normal appendix. Left colonic diverticulosis. No active diverticulitis. Stomach and small bowel decompressed. No evidence of obstruction or inflammatory process. Vascular/Lymphatic: No evidence  of aneurysm or adenopathy. Reproductive: Uterus and adnexa unremarkable.  No mass. Other: No free fluid  or free air. Musculoskeletal: No acute bony abnormality. IMPRESSION: No acute findings in the abdomen or pelvis. Left colonic diverticulosis. Electronically Signed   By: Charlett Nose M.D.   On: 12/17/2023 00:28    Procedures Procedures    Medications Ordered in ED Medications  potassium chloride SA (KLOR-CON M) CR tablet 80 mEq (80 mEq Oral Given 12/16/23 2347)  magnesium sulfate IVPB 2 g 50 mL (0 g Intravenous Stopped 12/17/23 0101)  iohexol (OMNIPAQUE) 300 MG/ML solution 100 mL (100 mLs Intravenous Contrast Given 12/17/23 0000)  ketorolac (TORADOL) 30 MG/ML injection 30 mg (30 mg Intravenous Given 12/17/23 0049)  ondansetron (ZOFRAN) injection 4 mg (4 mg Intravenous Given 12/17/23 0133)  sodium chloride 0.9 % bolus 500 mL (500 mLs Intravenous New Bag/Given 12/17/23 0133)    ED Course/ Medical Decision Making/ A&P                                 Medical Decision Making Patient with nausea vomiting and diarrhea   Amount and/or Complexity of Data Reviewed External Data Reviewed: notes.    Details: Previous notes reviewed  Labs: ordered.    Details: Pregn is negative urine is without UTI.  Normal lipase 21. Normal white count 6.2, normal hemoglobin 12.1.  Normal sodium 135, low potassium 2.8, normal creatinine normal LFTs  Radiology: ordered and independent interpretation performed.    Details: NO SBO  Risk Prescription drug management. Risk Details: No further emesis in the ED.  Zofran is constipating so this may help. Potassium given in the ED.  Patient PO challenged in the ED.  Symptoms are consistent with viral etiology of n/v/d.  Will start potassium for at home and have patient follow up for recheck.  Work note given     Final Clinical Impression(s) / ED Diagnoses Final diagnoses:  Nausea vomiting and diarrhea  Hypokalemia   I have reviewed the triage vital signs and the  nursing notes. Pertinent labs & imaging results that were available during my care of the patient were reviewed by me and considered in my medical decision making (see chart for details). After history, exam, and medical workup I feel the patient has been appropriately medically screened and is safe for discharge home. Pertinent diagnoses were discussed with the patient. Patient was given return precautions.    Rx / DC Orders ED Discharge Orders          Ordered    potassium chloride SA (KLOR-CON M) 20 MEQ tablet  2 times daily        12/17/23 0047              Makayela Secrest, MD 12/17/23 [id number]

## 2023-12-17 NOTE — ED Notes (Signed)
 Ginger ale given to pt.

## 2023-12-17 NOTE — ED Notes (Signed)
 Pt has tolerated ginger ale well.  Pt continues to have diarrhea has had 3 episodes while in ED

## 2024-03-26 ENCOUNTER — Emergency Department (HOSPITAL_BASED_OUTPATIENT_CLINIC_OR_DEPARTMENT_OTHER)

## 2024-03-26 ENCOUNTER — Emergency Department (HOSPITAL_BASED_OUTPATIENT_CLINIC_OR_DEPARTMENT_OTHER)
Admission: EM | Admit: 2024-03-26 | Discharge: 2024-03-26 | Discharge status: Against Medical Advice | Attending: Emergency Medicine | Admitting: Emergency Medicine

## 2024-03-26 ENCOUNTER — Other Ambulatory Visit: Payer: Self-pay

## 2024-03-26 DIAGNOSIS — Z5321 Procedure and treatment not carried out due to patient leaving prior to being seen by health care provider: Secondary | ICD-10-CM | POA: Diagnosis not present

## 2024-03-26 DIAGNOSIS — M542 Cervicalgia: Secondary | ICD-10-CM | POA: Insufficient documentation

## 2024-03-26 DIAGNOSIS — R220 Localized swelling, mass and lump, head: Secondary | ICD-10-CM | POA: Insufficient documentation

## 2024-03-26 DIAGNOSIS — R519 Headache, unspecified: Secondary | ICD-10-CM | POA: Diagnosis present

## 2024-03-26 LAB — CBC WITH DIFFERENTIAL/PLATELET
Abs Immature Granulocytes: 0.01 10*3/uL (ref 0.00–0.07)
Basophils Absolute: 0 10*3/uL (ref 0.0–0.1)
Basophils Relative: 1 %
Eosinophils Absolute: 0.4 10*3/uL (ref 0.0–0.5)
Eosinophils Relative: 7 %
HCT: 35.5 % — ABNORMAL LOW (ref 36.0–46.0)
Hemoglobin: 12 g/dL (ref 12.0–15.0)
Immature Granulocytes: 0 %
Lymphocytes Relative: 30 %
Lymphs Abs: 1.8 10*3/uL (ref 0.7–4.0)
MCH: 29.1 pg (ref 26.0–34.0)
MCHC: 33.8 g/dL (ref 30.0–36.0)
MCV: 86 fL (ref 80.0–100.0)
Monocytes Absolute: 0.6 10*3/uL (ref 0.1–1.0)
Monocytes Relative: 10 %
Neutro Abs: 3.1 10*3/uL (ref 1.7–7.7)
Neutrophils Relative %: 52 %
Platelets: 211 10*3/uL (ref 150–400)
RBC: 4.13 MIL/uL (ref 3.87–5.11)
RDW: 13.4 % (ref 11.5–15.5)
WBC: 5.9 10*3/uL (ref 4.0–10.5)
nRBC: 0 % (ref 0.0–0.2)

## 2024-03-26 LAB — BASIC METABOLIC PANEL WITH GFR
Anion gap: 13 (ref 5–15)
BUN: 24 mg/dL — ABNORMAL HIGH (ref 6–20)
CO2: 26 mmol/L (ref 22–32)
Calcium: 9.2 mg/dL (ref 8.9–10.3)
Chloride: 99 mmol/L (ref 98–111)
Creatinine, Ser: 1.03 mg/dL — ABNORMAL HIGH (ref 0.44–1.00)
GFR, Estimated: >60 mL/min (ref >60)
Glucose, Bld: 132 mg/dL — ABNORMAL HIGH (ref 70–99)
Potassium: 3.2 mmol/L — ABNORMAL LOW (ref 3.5–5.1)
Sodium: 138 mmol/L (ref 135–145)

## 2024-03-26 MED ORDER — SODIUM CHLORIDE 0.9 % IV BOLUS
1000.0000 mL | Freq: Once | INTRAVENOUS | Status: AC
  Administered 2024-03-26: 1000 mL via INTRAVENOUS

## 2024-03-26 MED ORDER — KETOROLAC TROMETHAMINE 15 MG/ML IJ SOLN
15.0000 mg | Freq: Once | INTRAMUSCULAR | Status: AC
  Administered 2024-03-26: 15 mg via INTRAVENOUS
  Filled 2024-03-26: qty 1

## 2024-03-26 MED ORDER — IOHEXOL 300 MG/ML  SOLN
75.0000 mL | Freq: Once | INTRAMUSCULAR | Status: AC | PRN
Start: 2024-03-26 — End: 2024-03-26
  Administered 2024-03-26: 75 mL via INTRAVENOUS

## 2024-03-26 NOTE — ED Notes (Signed)
 Pt requested to leave and not wait any longer for CT results.  Dr. Nolia Baumgartner notified and spoke with the patient. Pt still elected to leave.

## 2024-03-26 NOTE — ED Triage Notes (Signed)
 Hard bump behind pt left ear x over 1 week. Pt noticed bump getting bigger. Bump is very sensitive to touch. Pt also endorses headache since yesterday

## 2024-03-26 NOTE — ED Provider Notes (Signed)
 [place] [facility] Provider Note   CSN: [id number] Arrival date & time: 03/26/24  1505     History  Chief Complaint  Patient presents with   Possible Abscess     [name] is a 53 y.o. female.  Patient is a 53 year old female who presents with pain and swelling behind her left ear.  She noticed a bump behind her ear that started about a week ago.  She says its gotten larger and more painful since then.  She now has an associated left-sided headache.  No fevers.  No tooth problems although she said she had a recent toothache that has resolved.  She denies any sinus congestion.  No cough or cold symptoms.  No ear pain.  No sore throat.  No difficulty swallowing.  No shortness of breath.  On chart review, she was seen about a month ago at [facility] for a dental infection and was prescribed Augmentin .  The infection/pain resolved after this.       Home Medications Prior to Admission medications   Medication Sig Start Date End Date Taking? Authorizing Provider  azithromycin  (ZITHROMAX ) 250 MG tablet Take 1 tablet (250 mg total) by mouth daily. Take first 2 tablets together, then 1 every day until finished. 08/29/23   Spence Dux, PA-C  benzonatate  (TESSALON ) 100 MG capsule Take 1 capsule (100 mg total) by mouth every 8 (eight) hours. 09/15/22   Aberman, Caroline C, PA-C  diclofenac  sodium (VOLTAREN ) 1 % GEL Apply 2 g topically 4 (four) times daily. 08/11/19   Caccavale, Sophia, PA-C  lidocaine  (LIDODERM ) 5 % Place 1 patch onto the skin daily. Remove & Discard patch within 12 hours or as directed by MD 07/14/20   Maralee Senate, April, MD  ondansetron  (ZOFRAN  ODT) 4 MG disintegrating tablet Take 1 tablet (4 mg total) by mouth every 8 (eight) hours as needed for nausea or vomiting. 04/04/20   Maryanna Smart, PA-C  potassium chloride  SA (KLOR-CON  M) 20 MEQ tablet Take 1 tablet (20 mEq total) by mouth 2 (two) times daily. 12/17/23   Palumbo,  April, MD  potassium chloride  SA (KLOR-CON ) 20 MEQ tablet Take 2 tablets (40 mEq total) by mouth daily. 04/04/20   Maryanna Smart, PA-C  topiramate (TOPAMAX) 50 MG tablet Topamax 50 mg tablet  Take 1 tablet twice a day by oral route.    [provider]      Allergies    Midazolam and Midazolam hcl    Review of Systems   Review of Systems  Constitutional:  Negative for chills, diaphoresis, fatigue and fever.  HENT:  Positive for ear pain (Behind left ear). Negative for congestion, rhinorrhea and sneezing.   Eyes: Negative.   Respiratory:  Negative for cough, chest tightness and shortness of breath.   Cardiovascular:  Negative for chest pain and leg swelling.  Gastrointestinal:  Negative for abdominal pain, blood in stool, diarrhea, nausea and vomiting.  Genitourinary:  Negative for difficulty urinating, flank pain, frequency and hematuria.  Musculoskeletal:  Negative for arthralgias and back pain.  Skin:  Negative for rash.  Neurological:  Negative for dizziness, speech difficulty, weakness, numbness and headaches.    Physical Exam Updated Vital Signs BP (!) 151/99   Pulse 88   Temp 99.2 F (37.3 C) (Oral)   Resp 15   Ht 5\' 1"  (1.549 m)   Wt 63.5 kg   SpO2 100%   BMI 26.45 kg/m  Physical Exam Constitutional:  Appearance: She is well-developed.  HENT:     Head: Normocephalic and atraumatic.     Right Ear: Tympanic membrane normal.     Left Ear: Tympanic membrane normal.     Ears:     Comments: Positive enlarged knot behind her left ear.  Seems consistent with lymphadenopathy.  There is no fluctuance.  No induration.  She does however have some fullness to face anterior to the left ear at the TMJ area.  She does not have tenderness to this area.  There is seemingly some enlarged lymph nodes to the left submandibular area.  No trismus.  Uvula is midline.  No lesions inside the mouth.  No dental abscess or pain.    Mouth/Throat:     Mouth: Mucous membranes are  moist.     Pharynx: No oropharyngeal exudate or posterior oropharyngeal erythema.  Eyes:     Pupils: Pupils are equal, round, and reactive to light.  Cardiovascular:     Rate and Rhythm: Normal rate and regular rhythm.     Heart sounds: Normal heart sounds.  Pulmonary:     Effort: Pulmonary effort is normal. No respiratory distress.     Breath sounds: Normal breath sounds. No wheezing or rales.  Chest:     Chest wall: No tenderness.  Abdominal:     General: Bowel sounds are normal.     Palpations: Abdomen is soft.     Tenderness: There is no abdominal tenderness. There is no guarding or rebound.  Musculoskeletal:        General: Normal range of motion.     Cervical back: Normal range of motion and neck supple.  Lymphadenopathy:     Cervical: No cervical adenopathy.  Skin:    General: Skin is warm and dry.     Findings: No rash.  Neurological:     Mental Status: She is alert and oriented to person, place, and time.     ED Results / Procedures / Treatments   Labs (all labs ordered are listed, but only abnormal results are displayed) Labs Reviewed  BASIC METABOLIC PANEL WITH GFR - Abnormal; Notable for the following components:      Result Value   Potassium 3.2 (*)    Glucose, Bld 132 (*)    BUN 24 (*)    Creatinine, Ser 1.03 (*)    All other components within normal limits  CBC WITH DIFFERENTIAL/PLATELET - Abnormal; Notable for the following components:   HCT 35.5 (*)    All other components within normal limits    EKG None  Radiology CT Soft Tissue Neck W Contrast Result Date: 03/26/2024 CLINICAL DATA:  Swelling around left ear, left-sided headache. Enlarged lymph nodes behind the ear/hard bump behind the left ear for over 1 week. EXAM: CT NECK WITH CONTRAST TECHNIQUE: Multidetector CT imaging of the neck was performed using the standard protocol following the bolus administration of intravenous contrast. RADIATION DOSE REDUCTION: This exam was performed according to  the departmental dose-optimization program which includes automated exposure control, adjustment of the mA and/or kV according to patient size and/or use of iterative reconstruction technique. CONTRAST:  75mL OMNIPAQUE  IOHEXOL  300 MG/ML  SOLN COMPARISON:  None Available. FINDINGS: Pharynx and larynx: Nasopharynx is symmetric. Symmetric appearance of the tonsils. The oral cavity is unremarkable. Normal appearance of the base of tongue and epiglottis. No retropharyngeal effusion. Symmetric appearance of the aryepiglottic folds and piriform sinuses. Symmetric appearance of the vocal folds. Salivary glands: Subcentimeter mildly enhancing nodule along the  posterior aspect of the left parotid gland measuring up to 4 mm in short axis likely reflecting a periparotid lymph node. The parotid glands are otherwise symmetric. Symmetric submandibular glands. No abnormal calcification. No acute inflammatory changes. Thyroid: Normal. Lymph nodes: There are mildly prominent left level 2 B/5 cervical nodes measuring up to 0.8 cm in short axis which demonstrate mild enhancement. There is a mildly enlarged left level 4 cervical node measuring up to 1.0 cm in short axis (series 3, image 87). Otherwise there are no enlarged cervical lymph nodes by imaging size criteria. Vascular: Negative. Limited intracranial: Negative. Visualized orbits: Negative. Mastoids and visualized paranasal sinuses: Mild mucosal thickening in the alveolar recess of the left maxillary sinus. No air-fluid levels. Mastoid air cells are clear. Skeleton: No acute or aggressive process. Upper chest: Negative. Other: None. IMPRESSION: Mildly prominent left posterior cervical nodes involving level II b/Va favored to reflect reactive nodes. Recommend clinical follow-up to resolution. No inflammatory changes in the adjacent subcutaneous tissues. No acute abnormality of the left parotid gland. Left mastoid air cells are clear. No acute abnormality along the aerodigestive  structures in the neck. There is a mildly enlarged left level IV cervical/supraclavicular lymph node. An enlarged, firm lymph node in this location can occasionally be seen in the setting thoracic or abdominal malignancy. Recommend correlation with palpable findings on physical exam and consider nonemergent correlation with CT chest abdomen pelvis. Electronically Signed   By: Denny Flack M.D.   On: 03/26/2024 19:19    Procedures Procedures    Medications Ordered in ED Medications  ketorolac  (TORADOL ) 15 MG/ML injection 15 mg (15 mg Intravenous Given 03/26/24 1603)  iohexol  (OMNIPAQUE ) 300 MG/ML solution 75 mL (75 mLs Intravenous Contrast Given 03/26/24 1723)  sodium chloride  0.9 % bolus 1,000 mL (0 mLs Intravenous Stopped 03/26/24 1916)    ED Course/ Medical Decision Making/ A&P                                 Medical Decision Making Amount and/or Complexity of Data Reviewed Labs: ordered. Radiology: ordered.  Risk Prescription drug management.   Patient is a 53 year old female who presents with pain and swelling behind her left ear.  Feels like she has some lymphadenopathy and also some lymphadenopathy in her submandibular area.  She does have some swelling to the left side of her neck as well.  She does not appear to have any airway involvement.  Her labs reviewed and are nonconcerning.  CT scan was performed.  However patient left AMA prior to getting CT results.  I did encourage her to stay but she said she needed to leave.  CT report shows some concerning lymphadenopathy.  It is recommended that she have further CT imaging as an outpatient.  I attempted to contact the patient via the phone number that was on her contact information but it went to a voicemail of the person who had a different name than her. Mychart message sent.    Final Clinical Impression(s) / ED Diagnoses Final diagnoses:  Acute nonintractable headache, unspecified headache type  Neck pain    Rx / DC  Orders ED Discharge Orders     None         Hershel Los, MD 03/27/24 1333

## 2024-03-27 ENCOUNTER — Other Ambulatory Visit: Payer: Self-pay

## 2024-03-27 ENCOUNTER — Encounter (INDEPENDENT_AMBULATORY_CARE_PROVIDER_SITE_OTHER): Payer: Self-pay | Admitting: Emergency Medicine

## 2024-03-27 ENCOUNTER — Other Ambulatory Visit (HOSPITAL_BASED_OUTPATIENT_CLINIC_OR_DEPARTMENT_OTHER): Payer: Self-pay

## 2024-03-27 ENCOUNTER — Emergency Department (HOSPITAL_BASED_OUTPATIENT_CLINIC_OR_DEPARTMENT_OTHER): Admission: EM | Admit: 2024-03-27 | Discharge: 2024-03-27 | Disposition: A | Discharge status: Home / Self Care

## 2024-03-27 ENCOUNTER — Encounter (HOSPITAL_BASED_OUTPATIENT_CLINIC_OR_DEPARTMENT_OTHER): Payer: Self-pay

## 2024-03-27 DIAGNOSIS — R519 Headache, unspecified: Secondary | ICD-10-CM | POA: Insufficient documentation

## 2024-03-27 DIAGNOSIS — R590 Localized enlarged lymph nodes: Secondary | ICD-10-CM | POA: Insufficient documentation

## 2024-03-27 DIAGNOSIS — Z853 Personal history of malignant neoplasm of breast: Secondary | ICD-10-CM | POA: Diagnosis not present

## 2024-03-27 DIAGNOSIS — I10 Essential (primary) hypertension: Secondary | ICD-10-CM | POA: Diagnosis not present

## 2024-03-27 MED ORDER — CELECOXIB 200 MG PO CAPS
200.0000 mg | ORAL_CAPSULE | Freq: Two times a day (BID) | ORAL | 0 refills | Status: DC | PRN
  Filled 2024-03-27: qty 30, 15d supply, fill #0

## 2024-03-27 MED ORDER — CELECOXIB 200 MG PO CAPS: 200.0000 mg | ORAL_CAPSULE | Freq: Two times a day (BID) | ORAL | 0 refills | Status: DC | PRN

## 2024-03-27 MED ORDER — AMOXICILLIN-POT CLAVULANATE 875-125 MG PO TABS: 1.0000 | ORAL_TABLET | Freq: Two times a day (BID) | ORAL | 0 refills | Status: DC

## 2024-03-27 MED ORDER — AMOXICILLIN-POT CLAVULANATE 875-125 MG PO TABS
1.0000 | ORAL_TABLET | Freq: Two times a day (BID) | ORAL | 0 refills | Status: DC
  Filled 2024-03-27: qty 20, 10d supply, fill #0

## 2024-03-27 NOTE — Discharge Instructions (Addendum)
 As discussed, the CT scan did show enlargement of your lymph nodes on the left side which is most likely causing the mass.  No obvious abscess to drain.  Will send you home with medicine for pain as well as antibiotics, certainly possible bacterial component.  Recommend follow-up with your primary care for reassessment as one of the lymph nodes involved can sometimes be enlarged with certain cancers.  Recommendation is to have CT scan of the chest and abdomen by primary care if they stay enlarged.

## 2024-03-27 NOTE — ED Triage Notes (Signed)
 Pt reports a "knot" behind L ear and causing pain down neck and headache. Pt reports being seen for same yesterday.

## 2024-03-27 NOTE — ED Provider Notes (Signed)
 [place] [facility] AT [facility] Provider Note   CSN: [id number] Arrival date & time: 03/27/24  1045     History  Chief Complaint  Patient presents with   Headache    [name] is a 53 y.o. female.   Headache   53 year old female presents emergency department complaints of behind left ear.  Has been present for the past week or so.  Was seen in ED yesterday with CT scan soft tissue neck but left prior to scan being read.  Presents today for reassessment as well as follow-up regarding CT imaging.  Denies any known cuts or scrapes, ear pain.  Does state that she has some pain rating up to the left back of her head.  Has been trying NSAIDs, topical lidocaine  patches without significant improvement of symptoms.  Denies any unintentional weight loss, night sweats, fever.   Past medical history significant for hypertension, migraine, breast cancer,  Home Medications Prior to Admission medications   Medication Sig Start Date End Date Taking? Authorizing Provider  azithromycin  (ZITHROMAX ) 250 MG tablet Take 1 tablet (250 mg total) by mouth daily. Take first 2 tablets together, then 1 every day until finished. 08/29/23   Spence Dux, PA-C  benzonatate  (TESSALON ) 100 MG capsule Take 1 capsule (100 mg total) by mouth every 8 (eight) hours. 09/15/22   Aberman, Caroline C, PA-C  diclofenac  sodium (VOLTAREN ) 1 % GEL Apply 2 g topically 4 (four) times daily. 08/11/19   Caccavale, Sophia, PA-C  lidocaine  (LIDODERM ) 5 % Place 1 patch onto the skin daily. Remove & Discard patch within 12 hours or as directed by MD 07/14/20   Maralee Senate, April, MD  ondansetron  (ZOFRAN  ODT) 4 MG disintegrating tablet Take 1 tablet (4 mg total) by mouth every 8 (eight) hours as needed for nausea or vomiting. 04/04/20   Maryanna Smart, PA-C  potassium chloride  SA (KLOR-CON  M) 20 MEQ tablet Take 1 tablet (20 mEq total) by mouth 2 (two) times daily. 12/17/23   Palumbo, April, MD  potassium  chloride SA (KLOR-CON ) 20 MEQ tablet Take 2 tablets (40 mEq total) by mouth daily. 04/04/20   Maryanna Smart, PA-C  topiramate (TOPAMAX) 50 MG tablet Topamax 50 mg tablet  Take 1 tablet twice a day by oral route.    [provider]      Allergies    Midazolam and Midazolam hcl    Review of Systems   Review of Systems  Neurological:  Positive for headaches.  All other systems reviewed and are negative.   Physical Exam Updated Vital Signs BP (!) 149/108 (BP Location: Left Arm)   Pulse 84   Temp 98.6 F (37 C) (Oral)   Resp 18   Ht 5\' 1"  (1.549 m)   Wt 63.5 kg   SpO2 100%   BMI 26.45 kg/m  Physical Exam Vitals and nursing note reviewed.  Constitutional:      General: She is not in acute distress.    Appearance: She is well-developed.  HENT:     Head: Normocephalic and atraumatic.     Comments: Tender lymphadenopathy postauricular palp is present anterior cervical chain.  No overlying erythema, palpable fluctuance/induration. Eyes:     Conjunctiva/sclera: Conjunctivae normal.  Cardiovascular:     Rate and Rhythm: Normal rate and regular rhythm.     Heart sounds: No murmur heard. Pulmonary:     Effort: Pulmonary effort is normal. No respiratory distress.     Breath sounds: Normal breath sounds.  Abdominal:     Palpations: Abdomen is soft.     Tenderness: There is no abdominal tenderness.  Musculoskeletal:        General: No swelling.     Cervical back: Neck supple.  Skin:    General: Skin is warm and dry.     Capillary Refill: Capillary refill takes less than 2 seconds.  Neurological:     Mental Status: She is alert.  Psychiatric:        Mood and Affect: Mood normal.     ED Results / Procedures / Treatments   Labs (all labs ordered are listed, but only abnormal results are displayed) Labs Reviewed - No data to display  EKG None  Radiology CT Soft Tissue Neck W Contrast Result Date: 03/26/2024 CLINICAL DATA:  Swelling around left ear,  left-sided headache. Enlarged lymph nodes behind the ear/hard bump behind the left ear for over 1 week. EXAM: CT NECK WITH CONTRAST TECHNIQUE: Multidetector CT imaging of the neck was performed using the standard protocol following the bolus administration of intravenous contrast. RADIATION DOSE REDUCTION: This exam was performed according to the departmental dose-optimization program which includes automated exposure control, adjustment of the mA and/or kV according to patient size and/or use of iterative reconstruction technique. CONTRAST:  75mL OMNIPAQUE  IOHEXOL  300 MG/ML  SOLN COMPARISON:  None Available. FINDINGS: Pharynx and larynx: Nasopharynx is symmetric. Symmetric appearance of the tonsils. The oral cavity is unremarkable. Normal appearance of the base of tongue and epiglottis. No retropharyngeal effusion. Symmetric appearance of the aryepiglottic folds and piriform sinuses. Symmetric appearance of the vocal folds. Salivary glands: Subcentimeter mildly enhancing nodule along the posterior aspect of the left parotid gland measuring up to 4 mm in short axis likely reflecting a periparotid lymph node. The parotid glands are otherwise symmetric. Symmetric submandibular glands. No abnormal calcification. No acute inflammatory changes. Thyroid: Normal. Lymph nodes: There are mildly prominent left level 2 B/5 cervical nodes measuring up to 0.8 cm in short axis which demonstrate mild enhancement. There is a mildly enlarged left level 4 cervical node measuring up to 1.0 cm in short axis (series 3, image 87). Otherwise there are no enlarged cervical lymph nodes by imaging size criteria. Vascular: Negative. Limited intracranial: Negative. Visualized orbits: Negative. Mastoids and visualized paranasal sinuses: Mild mucosal thickening in the alveolar recess of the left maxillary sinus. No air-fluid levels. Mastoid air cells are clear. Skeleton: No acute or aggressive process. Upper chest: Negative. Other: None.  IMPRESSION: Mildly prominent left posterior cervical nodes involving level II b/Va favored to reflect reactive nodes. Recommend clinical follow-up to resolution. No inflammatory changes in the adjacent subcutaneous tissues. No acute abnormality of the left parotid gland. Left mastoid air cells are clear. No acute abnormality along the aerodigestive structures in the neck. There is a mildly enlarged left level IV cervical/supraclavicular lymph node. An enlarged, firm lymph node in this location can occasionally be seen in the setting thoracic or abdominal malignancy. Recommend correlation with palpable findings on physical exam and consider nonemergent correlation with CT chest abdomen pelvis. Electronically Signed   By: Denny Flack M.D.   On: 03/26/2024 19:19    Procedures Procedures    Medications Ordered in ED Medications - No data to display  ED Course/ Medical Decision Making/ A&P                                 Medical Decision Making Risk Prescription  drug management.   This patient presents to the ED for concern of nodule, this involves an extensive number of treatment options, and is a complaint that carries with it a high risk of complications and morbidity.  The differential diagnosis includes abscess, lymphadenopathy, malignancy, cellulitis, shingles, cat scratch disease, necrotizing infection, other   Co morbidities that complicate the patient evaluation  See HPI   Additional history obtained:  Additional history obtained from EMR External records from outside source obtained and reviewed including hospital records   Lab Tests:  N/a   Imaging Studies ordered:  N/a   Cardiac Monitoring: / EKG:  N/a   Consultations Obtained:  N/a   Problem List / ED Course / Critical interventions / Medication management  Lymphadenopathy Reevaluation of the patient showed that the patient stayed the same I have reviewed the patients home medicines and have made  adjustments as needed   Social Determinants of Health:  Denies tobacco, licit drug use.   Test / Admission - Considered:  Lymphadenopathy Vitals signs significant for hyper blood pressure 149/108. Otherwise within normal range and stable throughout visit. 53 year old female presents emergency department complaints of behind left ear.  Has been present for the past week or so.  Was seen in ED yesterday with CT scan soft tissue neck but left prior to scan being read.  Presents today for reassessment as well as follow-up regarding CT imaging.  Denies any known cuts or scrapes, ear pain.  Does state that she has some pain rating up to the left back of her head.  Has been trying NSAIDs, topical lidocaine  patches without significant improvement of symptoms.  Denies any unintentional weight loss, night sweats, fever.  On exam, tender lymphadenopathy behind left ear with other cervical lymph nodes appreciated on the left side nontender.  No overlying erythema, palpable fluctuance/induration.  CT scan from yesterday did show multiple cervical lymph nodes on the left side enlarged with 1 supraclavicular/cervical lymph node concerning for possible malignancy; recommendation was as outpatient CT scans of chest and abdomen.  Patient without cat exposure or known scratch/bite; low suspicion for cat scratch disease.  Will treat empirically for potential bacterial etiology of lymphadenitis.  Will recommend follow-up with primary care for reassessment of symptoms and potential further workup regarding lymph node enlargement.  Recommend symptomatic therapy as described in AVS.  Treatment plan discussed with patient and she is understanding was agreeable to said plan.  Patient is a well-appearing, afebrile in no acute distress upon discharge. Worrisome signs and symptoms were discussed with the patient, and the patient acknowledged understanding to return to the ED if noticed. Patient was stable upon discharge.           Final Clinical Impression(s) / ED Diagnoses Final diagnoses:  None    Rx / DC Orders ED Discharge Orders     None         Martinez Butter, [place] 03/27/24 1143    Rolinda Climes, DO 03/27/24 1534

## 2024-03-29 ENCOUNTER — Other Ambulatory Visit (HOSPITAL_BASED_OUTPATIENT_CLINIC_OR_DEPARTMENT_OTHER): Payer: Self-pay

## 2024-09-24 ENCOUNTER — Other Ambulatory Visit (HOSPITAL_BASED_OUTPATIENT_CLINIC_OR_DEPARTMENT_OTHER): Payer: Self-pay

## 2024-09-24 ENCOUNTER — Encounter (HOSPITAL_BASED_OUTPATIENT_CLINIC_OR_DEPARTMENT_OTHER): Payer: Self-pay

## 2024-09-24 ENCOUNTER — Other Ambulatory Visit: Payer: Self-pay

## 2024-09-24 ENCOUNTER — Emergency Department (HOSPITAL_BASED_OUTPATIENT_CLINIC_OR_DEPARTMENT_OTHER)
Admission: EM | Admit: 2024-09-24 | Discharge: 2024-09-24 | Disposition: A | Discharge status: Home / Self Care | Attending: Emergency Medicine | Admitting: Emergency Medicine

## 2024-09-24 DIAGNOSIS — K529 Noninfective gastroenteritis and colitis, unspecified: Secondary | ICD-10-CM | POA: Insufficient documentation

## 2024-09-24 DIAGNOSIS — E876 Hypokalemia: Secondary | ICD-10-CM | POA: Insufficient documentation

## 2024-09-24 DIAGNOSIS — I10 Essential (primary) hypertension: Secondary | ICD-10-CM | POA: Insufficient documentation

## 2024-09-24 DIAGNOSIS — D72829 Elevated white blood cell count, unspecified: Secondary | ICD-10-CM | POA: Insufficient documentation

## 2024-09-24 LAB — COMPREHENSIVE METABOLIC PANEL WITH GFR
ALT: 33 U/L (ref 0–44)
AST: 26 U/L (ref 15–41)
Albumin: 4.5 g/dL (ref 3.5–5.0)
Alkaline Phosphatase: 107 U/L (ref 38–126)
Anion gap: 10 (ref 5–15)
BUN: 15 mg/dL (ref 6–20)
CO2: 28 mmol/L (ref 22–32)
Calcium: 9.9 mg/dL (ref 8.9–10.3)
Chloride: 103 mmol/L (ref 98–111)
Creatinine, Ser: 0.91 mg/dL (ref 0.44–1.00)
GFR, Estimated: >60 mL/min (ref >60)
Glucose, Bld: 93 mg/dL (ref 70–99)
Potassium: 3.2 mmol/L — ABNORMAL LOW (ref 3.5–5.1)
Sodium: 140 mmol/L (ref 135–145)
Total Bilirubin: 0.4 mg/dL (ref 0.0–1.2)
Total Protein: 7.6 g/dL (ref 6.5–8.1)

## 2024-09-24 LAB — CBC WITH DIFFERENTIAL/PLATELET
Abs Immature Granulocytes: 0.03 K/uL (ref 0.00–0.07)
Basophils Absolute: 0 K/uL (ref 0.0–0.1)
Basophils Relative: 0 %
Eosinophils Absolute: 0.4 K/uL (ref 0.0–0.5)
Eosinophils Relative: 3 %
HCT: 38.9 % (ref 36.0–46.0)
Hemoglobin: 13.1 g/dL (ref 12.0–15.0)
Immature Granulocytes: 0 %
Lymphocytes Relative: 27 %
Lymphs Abs: 3.1 K/uL (ref 0.7–4.0)
MCH: 28.7 pg (ref 26.0–34.0)
MCHC: 33.7 g/dL (ref 30.0–36.0)
MCV: 85.3 fL (ref 80.0–100.0)
Monocytes Absolute: 1 K/uL (ref 0.1–1.0)
Monocytes Relative: 9 %
Neutro Abs: 6.9 K/uL (ref 1.7–7.7)
Neutrophils Relative %: 61 %
Platelets: 317 K/uL (ref 150–400)
RBC: 4.56 MIL/uL (ref 3.87–5.11)
RDW: 13.2 % (ref 11.5–15.5)
WBC: 11.5 K/uL — ABNORMAL HIGH (ref 4.0–10.5)
nRBC: 0 % (ref 0.0–0.2)

## 2024-09-24 LAB — RESP PANEL BY RT-PCR (RSV, FLU A&B, COVID)  RVPGX2
Influenza A by PCR: NEGATIVE
Influenza B by PCR: NEGATIVE
Resp Syncytial Virus by PCR: NEGATIVE
SARS Coronavirus 2 by RT PCR: NEGATIVE

## 2024-09-24 LAB — GROUP A STREP BY PCR: Group A Strep by PCR: NOT DETECTED

## 2024-09-24 LAB — LIPASE, BLOOD: Lipase: 19 U/L (ref 11–51)

## 2024-09-24 LAB — PREGNANCY, URINE: Preg Test, Ur: NEGATIVE

## 2024-09-24 MED ORDER — KETOROLAC TROMETHAMINE 15 MG/ML IJ SOLN
15.0000 mg | Freq: Once | INTRAMUSCULAR | Status: AC
  Administered 2024-09-24: 15 mg via INTRAVENOUS
  Filled 2024-09-24: qty 1

## 2024-09-24 MED ORDER — ONDANSETRON HCL 4 MG PO TABS
4.0000 mg | ORAL_TABLET | Freq: Four times a day (QID) | ORAL | 0 refills | Status: DC
  Filled 2024-09-24: qty 12, 3d supply, fill #0

## 2024-09-24 MED ORDER — NAPROXEN 250 MG PO TABS
500.0000 mg | ORAL_TABLET | Freq: Once | ORAL | Status: AC
  Administered 2024-09-24: 500 mg via ORAL
  Filled 2024-09-24: qty 2

## 2024-09-24 MED ORDER — LACTATED RINGERS IV BOLUS
1000.0000 mL | Freq: Once | INTRAVENOUS | Status: AC
  Administered 2024-09-24: 1000 mL via INTRAVENOUS

## 2024-09-24 NOTE — ED Triage Notes (Signed)
 Pt states that she has been having right hip and leg pain for a few weeks. Pt also states that she has had a cough and congestion x 3 days.

## 2024-09-24 NOTE — ED Provider Notes (Signed)
 [place] [facility] Provider Note   CSN: [id number] Arrival date & time: 09/24/24  1408    Patient presents with: Leg Pain   [name] is a 53 y.o. female.  HPI Patient is a 53 year old female presenting today for concerns for cough, congestion, body aches, chills, watery diarrhea that has been ongoing x 2 days.  Additionally noted that she has also had a flareup of sciatica, noted to have previously felt this when she was pregnant and had been recently moving extensive mount of boxes for the last couple weeks as she is preparing to move, describing it as a sharp shooting pain from buttocks down to right lateral leg.  Last took 1.5 g of Tylenol  this morning at approximately 8 AM.  Able to tolerate p.o. fluids.  No known sick contacts, denies recent travel.  Uncertain of fever as she does not have a thermometer.  Endorses mild headache, noted to be much less severe than her migraine headaches.  As well as odynophagia and some mild chest pain when coughing.  LMP was mid November.  Denies IVD use, dysuria, saddle paresthesia, trauma, fecal/urinary incontinence.  Denies vision changes, light/sound sensitivity, tinnitus, otalgia, shortness of breath, hemoptysis, abdominal pain, nausea, vomiting, melena, hematochezia, hematuria, vaginal discharge, vaginal bleeding, vaginal pain, lower leg swelling, rashes, numbness, weakness.      Prior to Admission medications   Medication Sig Start Date End Date Taking? Authorizing Provider  ondansetron  (ZOFRAN ) 4 MG tablet Take 1 tablet (4 mg total) by mouth every 6 (six) hours. 09/24/24  Yes Bridey Brookover S, PA-C  amoxicillin -clavulanate (AUGMENTIN ) 875-125 MG tablet Take 1 tablet by mouth every 12 (twelve) hours. 03/27/24   Silver Wonda DELENA, PA  azithromycin  (ZITHROMAX ) 250 MG tablet Take 1 tablet (250 mg total) by mouth daily. Take first 2 tablets together, then 1 every day until finished. 08/29/23    Bernis Ernst, PA-C  benzonatate  (TESSALON ) 100 MG capsule Take 1 capsule (100 mg total) by mouth every 8 (eight) hours. 09/15/22   Lorelle Aleck BROCKS, PA-C  celecoxib  (CELEBREX ) 200 MG capsule Take 1 capsule (200 mg total) by mouth 2 (two) times daily as needed. 03/27/24   Silver Wonda DELENA, PA  diclofenac  sodium (VOLTAREN ) 1 % GEL Apply 2 g topically 4 (four) times daily. 08/11/19   Caccavale, Sophia, PA-C  lidocaine  (LIDODERM ) 5 % Place 1 patch onto the skin daily. Remove & Discard patch within 12 hours or as directed by MD 07/14/20   Nettie, April, MD  ondansetron  (ZOFRAN  ODT) 4 MG disintegrating tablet Take 1 tablet (4 mg total) by mouth every 8 (eight) hours as needed for nausea or vomiting. 04/04/20   Odis Burnard Jansky, PA-C  potassium chloride  SA (KLOR-CON  M) 20 MEQ tablet Take 1 tablet (20 mEq total) by mouth 2 (two) times daily. 12/17/23   Palumbo, April, MD  potassium chloride  SA (KLOR-CON ) 20 MEQ tablet Take 2 tablets (40 mEq total) by mouth daily. 04/04/20   Odis Burnard Jansky, PA-C  topiramate (TOPAMAX) 50 MG tablet Topamax 50 mg tablet  Take 1 tablet twice a day by oral route.    [provider]    Allergies: Midazolam and Midazolam hcl    Review of Systems  Constitutional:  Positive for chills.  HENT:  Positive for congestion.   Respiratory:  Positive for cough.   Gastrointestinal:  Positive for diarrhea.  Neurological:  Positive for headaches.  All other systems reviewed and are negative.  Updated Vital Signs BP (!) 152/109   Pulse (!) 112   Temp 98.8 F (37.1 C) (Oral)   Resp 19   Ht 5' 1 (1.549 m)   Wt 68 kg   LMP 08/31/2024 (Approximate)   SpO2 98%   BMI 28.34 kg/m   Physical Exam Vitals and nursing note reviewed.  Constitutional:      General: She is not in acute distress.    Appearance: Normal appearance. She is ill-appearing. She is not diaphoretic.  HENT:     Head: Normocephalic and atraumatic.     Right Ear: Tympanic membrane, ear canal and  external ear normal.     Left Ear: There is impacted cerumen.     Nose: Congestion present.     Mouth/Throat:     Mouth: Mucous membranes are moist.     Pharynx: Oropharynx is clear. Posterior oropharyngeal erythema present. No oropharyngeal exudate.  Eyes:     General: No scleral icterus.       Right eye: No discharge.        Left eye: No discharge.     Extraocular Movements: Extraocular movements intact.     Conjunctiva/sclera: Conjunctivae normal.  Cardiovascular:     Rate and Rhythm: Regular rhythm. Tachycardia present.     Pulses: Normal pulses.     Heart sounds: Normal heart sounds. No murmur heard.    No friction rub. No gallop.  Pulmonary:     Effort: Pulmonary effort is normal. No respiratory distress.     Breath sounds: No stridor. No wheezing, rhonchi or rales.  Chest:     Chest wall: No tenderness.  Abdominal:     General: Abdomen is flat. There is no distension.     Palpations: Abdomen is soft.     Tenderness: There is no abdominal tenderness. There is no right CVA tenderness, left CVA tenderness, guarding or rebound.  Musculoskeletal:        General: No swelling, tenderness, deformity or signs of injury.     Cervical back: Normal range of motion and neck supple. No rigidity or tenderness.     Right lower leg: No edema.     Left lower leg: No edema.     Comments: No midline tenderness to lumbar spine.  Lymphadenopathy:     Cervical: Cervical adenopathy present.  Skin:    General: Skin is warm and dry.     Findings: No bruising, erythema or lesion.  Neurological:     General: No focal deficit present.     Mental Status: She is alert and oriented to person, place, and time. Mental status is at baseline.     Sensory: No sensory deficit.     Motor: No weakness.  Psychiatric:        Mood and Affect: Mood normal.     (all labs ordered are listed, but only abnormal results are displayed) Labs Reviewed  CBC WITH DIFFERENTIAL/PLATELET - Abnormal; Notable for the  following components:      Result Value   WBC 11.5 (*)    All other components within normal limits  COMPREHENSIVE METABOLIC PANEL WITH GFR - Abnormal; Notable for the following components:   Potassium 3.2 (*)    All other components within normal limits  RESP PANEL BY RT-PCR (RSV, FLU A&B, COVID)  RVPGX2  GROUP A STREP BY PCR  LIPASE, BLOOD  PREGNANCY, URINE    EKG: None  Radiology: No results found.  Procedures   Medications Ordered in the ED  naproxen  (NAPROSYN ) tablet 500 mg (500 mg Oral Given 09/24/24 1450)  lactated ringers  bolus 1,000 mL (1,000 mLs Intravenous New Bag/Given 09/24/24 1545)                               Medical Decision Making This patient is a 53 year-old female who presents to the ED for concern of cough, congestion, body aches, diarrhea, chest pain with coughing that started 2 days ago with accompanying reported flare of her sciatica for the last 2 weeks with her having exacerbating factors of moving boxes regularly and describing the pain as sharp and radiating down right lateral leg.   On physical exam, patient is in no acute distress, afebrile, alert and orient x 4, speaking in full sentences, nontachypneic. Notably with tachycardic with heart rate of low 100s.  Notably also did have erythematous oropharynx without any signs of exudate or swollen tonsils accompanied with bilateral anterior cervical lymphadenopathy.  With the examination otherwise unremarkable.  LCTAB, no murmur, no abdominal tenderness to palpation, no CVA tenderness, no lower leg edema, no rashes, good range of motion in hips and knees bilaterally, good sensation unremarkable exam otherwise.  With patient's current presentation, suspecting likely URI as patient's cause of symptoms.  Low suspicion for meningitis, sepsis, pneumonia, cauda equina syndrome, abscess.  Additionally having low suspicion for any emergent intra-abdominal etiology with patient having benign abdomen on examination  and only experiencing watery diarrhea.  Initially providing naproxen .  Strep test and respiratory panel were negative, considering patient is still tachycardic, will obtain labs and provide fluids.   Lab work is unremarkable only noting a mild hypokalemia and mild elevated white count likely secondary to diarrhea, with fluids provided, on reevaluation patient's tachycardia had resolved with heart rate in 80s with patient feeling that she is feeling better.  Suspecting likely gastroenteritis as patient's cause symptoms today.  Low suspicion for any emergent pathology present at this time.  Will have patient continue to manage her symptoms at home providing some Zofran  for her to ensure that she is continuing to hydrate and eat appropriately.  Will have her needed follow with PCP for any persistent symptoms and return to the ER for new or worsening symptoms.  Patient vital signs have remained stable throughout the course of patient's time in the ED. Low suspicion for any other emergent pathology at this time. I believe this patient is safe to be discharged. Provided strict return to ER precautions. Patient expressed agreement and understanding of plan. All questions were answered.  Differential diagnoses prior to evaluation: The emergent differential diagnosis includes, but is not limited to,  upper respiratory infection, lower respiratory infection, allergies, asthma, irritants, gastroenteritis, meningitis, pancreatitis, cauda equina syndrome, sciatica. This is not an exhaustive differential.   Past Medical History / Co-morbidities / Social History: Migraines, UTI, HTN  Status post appendectomy, cholecystectomy, status post breast lumpectomy Additional history: Chart reviewed. Pertinent results include:   Last seen in the emergency department on 03/27/2024 for cervical lymphadenopathy and seen previously on 03/26/2024 for acute non-intractable headache.  Lab Tests/Imaging studies: I personally  interpreted labs/imaging and the pertinent results include:    CBC notes a mild elevated white count 11.5 otherwise unremarkable CMP notes a mild hypokalemia 3.2 but otherwise unremarkable Strep test negative, respiratory panel negative Lipase unremarkable Pregnancy test negative..   Medications: I ordered medication including naproxen , LR, Zofran .  I have reviewed the patients home medicines and have made  adjustments as needed.  Critical Interventions: None  Social Determinants of Health: Has good follow-up with PCP  Disposition: After consideration of the diagnostic results and the patients response to treatment, I feel that the patient would benefit from discharge and treatment as above.   emergency department workup does not suggest an emergent condition requiring admission or immediate intervention beyond what has been performed at this time. The plan is: Symptomatic management at home, return for new or worsening symptoms, follow-up with PCP. The patient is safe for discharge and has been instructed to return immediately for worsening symptoms, change in symptoms or any other concerns.  Final diagnoses:  Gastroenteritis    ED Discharge Orders          Ordered    ondansetron  (ZOFRAN ) 4 MG tablet  Every 6 hours        09/24/24 1620               Rome Schlauch S, PA-C 09/24/24 1635    Kingsley, Victoria K, DO 09/24/24 1644

## 2024-09-24 NOTE — Discharge Instructions (Addendum)
 You are seen today for gastroenteritis and possible viral upper respiratory infection.  Your lab work and physical exam today were very reassuring and a low suspicion for any emergent cause of your symptoms today.  This is most likely a viral process that will resolve on its own however recommend he continue to wash hands regularly, hydrate well and continue with Tylenol  and ibuprofen  as needed.  Take Tylenol  (acetominophen)  650mg  every 4-6 hours, as needed for pain or fever. Do not take more than 4,000 mg in a 24-hour period. As this may cause liver damage. While this is rare, if you begin to develop yellowing of the skin or eyes, stop taking and return to ER immediately.  Take Ibuprofen  400mg  every 4-6 hours for pain or fever, not exceeding 3,200 mg per day as more than 3,200mg  can cause Stomach irritation, dizziness, kidney issues with long-term use.  I will also send in some nausea medication for you to use as needed to ensure that you are eating and drinking as well also help with diarrhea secondarily.  Return to the ER if you begin to have worsening fever, shortness of breath, blood in stool or urine, uncontrollable pain.  Otherwise please follow-up with your PCP for any persistent symptoms.

## 2024-10-03 ENCOUNTER — Encounter (HOSPITAL_BASED_OUTPATIENT_CLINIC_OR_DEPARTMENT_OTHER): Payer: Self-pay | Admitting: Emergency Medicine

## 2024-10-03 ENCOUNTER — Emergency Department (HOSPITAL_BASED_OUTPATIENT_CLINIC_OR_DEPARTMENT_OTHER)
Admission: EM | Admit: 2024-10-03 | Discharge: 2024-10-03 | Disposition: A | Discharge status: Home / Self Care | Attending: Emergency Medicine | Admitting: Emergency Medicine

## 2024-10-03 ENCOUNTER — Emergency Department (HOSPITAL_BASED_OUTPATIENT_CLINIC_OR_DEPARTMENT_OTHER)

## 2024-10-03 ENCOUNTER — Other Ambulatory Visit: Payer: Self-pay

## 2024-10-03 DIAGNOSIS — M25512 Pain in left shoulder: Secondary | ICD-10-CM

## 2024-10-03 MED ORDER — HYDROCHLOROTHIAZIDE 25 MG PO TABS
25.0000 mg | ORAL_TABLET | Freq: Once | ORAL | Status: AC
  Administered 2024-10-03: 25 mg via ORAL
  Filled 2024-10-03: qty 1

## 2024-10-03 NOTE — Discharge Instructions (Signed)
 Today you were seen for a left shoulder injury.  Your workup on the emergency department is reassuring.  You may wear your sling for support as needed.  You may alternate Tylenol  and Motrin  every 4 hours for pain and inflammation.  You may also ice or apply heat to the affected area.  Please follow-up with orthopedics if your symptoms persist for further evaluation and workup.  Thank you for letting us  treat you today. After reviewing your imaging, I feel you are safe to go home. Please follow up with your PCP in the next several days and provide them with your records from this visit. Return to the Emergency Room if pain becomes severe or symptoms worsen.

## 2024-10-03 NOTE — ED Triage Notes (Signed)
 Pt fell down about 4 steps today; c/o LT shoulder pain; BP elevated; sts she is compliant, but missed a dose this morning

## 2024-10-03 NOTE — ED Provider Notes (Signed)
 [place] [facility] Provider Note   CSN: [id number] Arrival date & time: 10/03/24  1716     Patient presents with: Fall and Shoulder Pain   [name] is a 53 y.o. female presents today after falling down approximately 4 steps.  Patient reporting left shoulder pain.  Patient denies numbness, tingling, head injury, blood thinner use, or any other injuries.  Patient does have hypertension and forgot to take her medication this morning.  Patient denies any other injuries at this time.  Patient denies blood thinner use.    Fall  Shoulder Pain      Prior to Admission medications  Medication Sig Start Date End Date Taking? Authorizing Provider  amoxicillin -clavulanate (AUGMENTIN ) 875-125 MG tablet Take 1 tablet by mouth every 12 (twelve) hours. 03/27/24   Silver Wonda DELENA, PA  azithromycin  (ZITHROMAX ) 250 MG tablet Take 1 tablet (250 mg total) by mouth daily. Take first 2 tablets together, then 1 every day until finished. 08/29/23   Bernis Ernst, PA-C  benzonatate  (TESSALON ) 100 MG capsule Take 1 capsule (100 mg total) by mouth every 8 (eight) hours. 09/15/22   Lorelle Aleck BROCKS, PA-C  celecoxib  (CELEBREX ) 200 MG capsule Take 1 capsule (200 mg total) by mouth 2 (two) times daily as needed. 03/27/24   Silver Wonda DELENA, PA  diclofenac  sodium (VOLTAREN ) 1 % GEL Apply 2 g topically 4 (four) times daily. 08/11/19   Caccavale, Sophia, PA-C  lidocaine  (LIDODERM ) 5 % Place 1 patch onto the skin daily. Remove & Discard patch within 12 hours or as directed by MD 07/14/20   Nettie, April, MD  ondansetron  (ZOFRAN  ODT) 4 MG disintegrating tablet Take 1 tablet (4 mg total) by mouth every 8 (eight) hours as needed for nausea or vomiting. 04/04/20   Odis Burnard Jansky, PA-C  ondansetron  (ZOFRAN ) 4 MG tablet Take 1 tablet (4 mg total) by mouth every 6 (six) hours. 09/24/24   Bauer, Collin S, PA-C  potassium chloride  SA (KLOR-CON  M) 20 MEQ tablet Take 1 tablet (20  mEq total) by mouth 2 (two) times daily. 12/17/23   Palumbo, April, MD  potassium chloride  SA (KLOR-CON ) 20 MEQ tablet Take 2 tablets (40 mEq total) by mouth daily. 04/04/20   Odis Burnard Jansky, PA-C  topiramate (TOPAMAX) 50 MG tablet Topamax 50 mg tablet  Take 1 tablet twice a day by oral route.    [provider]    Allergies: Midazolam and Midazolam hcl    Review of Systems  Musculoskeletal:  Positive for arthralgias.    Updated Vital Signs BP (!) 171/118 (BP Location: Right Arm)   Pulse 77   Temp 98.5 F (36.9 C) (Oral)   Resp (!) 22   Ht 5' 1 (1.549 m)   Wt 68 kg   LMP 08/31/2024 (Approximate)   SpO2 100%   BMI 28.34 kg/m   Physical Exam Vitals and nursing note reviewed.  Constitutional:      General: She is not in acute distress.    Appearance: She is well-developed.  HENT:     Head: Normocephalic and atraumatic.  Eyes:     Conjunctiva/sclera: Conjunctivae normal.  Cardiovascular:     Rate and Rhythm: Normal rate and regular rhythm.     Pulses: Normal pulses.  Pulmonary:     Effort: Pulmonary effort is normal. No respiratory distress.  Abdominal:     Palpations: Abdomen is soft.     Tenderness: There is no abdominal tenderness.  Musculoskeletal:  General: Tenderness and signs of injury present. No swelling or deformity.     Cervical back: Neck supple.     Comments: Tenderness to palpation of the anterior left shoulder without obvious deformity or ecchymosis.  Patient with equal bilateral grip strength.  Patient neurovascularly intact with +2 radial pulses.  Skin:    General: Skin is warm and dry.     Capillary Refill: Capillary refill takes less than 2 seconds.     Findings: No bruising.  Neurological:     General: No focal deficit present.     Mental Status: She is alert and oriented to person, place, and time.     Sensory: No sensory deficit.  Psychiatric:        Mood and Affect: Mood normal.     (all labs ordered are listed, but only  abnormal results are displayed) Labs Reviewed - No data to display  EKG: None  Radiology: DG Shoulder Left Result Date: 10/03/2024 CLINICAL DATA:  pain EXAM: LEFT SHOULDER - 2+ VIEW COMPARISON:  January 04, 2017 FINDINGS: No acute fracture or dislocation. Joint spaces and alignment are maintained. No area of erosion or osseous destruction. No unexpected radiopaque foreign body. Soft tissues are unremarkable. IMPRESSION: No acute fracture or dislocation. Electronically Signed   By: Corean Salter M.D.   On: 10/03/2024 18:15     Procedures   Medications Ordered in the ED  hydrochlorothiazide  (HYDRODIURIL ) tablet 25 mg (25 mg Oral Given 10/03/24 1757)                                    Medical Decision Making Amount and/or Complexity of Data Reviewed Radiology: ordered.  Risk Prescription drug management.   lalThis patient presents to the ED for concern of Fall differential diagnosis includes fracture, dislocation, musculoskeletal pain   Imaging Studies ordered:  I ordered imaging studies including left shoulder x-ray I independently visualized and interpreted imaging which showed no acute fracture or dislocation. I agree with the radiologist interpretation   Medicines ordered and prescription drug management:  I ordered medication including HCTZ    I have reviewed the patients home medicines and have made adjustments as needed   Problem List / ED Course:  Patient placed in sling for support. Considered for admission or further workup however patient's vital signs, physical exam, and imaging are reassuring.  Patient symptoms likely due to musculoskeletal pain.  Patient placed in sling for support and advised to alternate Tylenol  and Motrin  as needed for pain.  Patient to follow-up with orthopedics if her symptoms persist.  Patient given return precautions.  I feel patient is safer discharge at this time.      Final diagnoses:  Acute pain of left shoulder    ED  Discharge Orders     None          Francis Ileana SAILOR, PA-C 10/03/24 1821    Elnor Bernarda SQUIBB, DO 10/05/24 1421

## 2024-10-04 ENCOUNTER — Other Ambulatory Visit (HOSPITAL_BASED_OUTPATIENT_CLINIC_OR_DEPARTMENT_OTHER): Payer: Self-pay

## 2024-11-17 ENCOUNTER — Other Ambulatory Visit: Payer: Self-pay

## 2024-11-17 ENCOUNTER — Emergency Department (HOSPITAL_BASED_OUTPATIENT_CLINIC_OR_DEPARTMENT_OTHER)
Admission: EM | Admit: 2024-11-17 | Discharge: 2024-11-18 | Disposition: A | Discharge status: Home / Self Care | Attending: Emergency Medicine | Admitting: Emergency Medicine

## 2024-11-17 ENCOUNTER — Encounter (HOSPITAL_BASED_OUTPATIENT_CLINIC_OR_DEPARTMENT_OTHER): Payer: Self-pay | Admitting: Emergency Medicine

## 2024-11-17 DIAGNOSIS — I10 Essential (primary) hypertension: Secondary | ICD-10-CM | POA: Insufficient documentation

## 2024-11-17 DIAGNOSIS — J101 Influenza due to other identified influenza virus with other respiratory manifestations: Secondary | ICD-10-CM | POA: Diagnosis not present

## 2024-11-17 DIAGNOSIS — J111 Influenza due to unidentified influenza virus with other respiratory manifestations: Secondary | ICD-10-CM

## 2024-11-17 DIAGNOSIS — R059 Cough, unspecified: Secondary | ICD-10-CM | POA: Diagnosis present

## 2024-11-17 LAB — RESP PANEL BY RT-PCR (RSV, FLU A&B, COVID)  RVPGX2
Influenza A by PCR: POSITIVE — AB
Influenza B by PCR: NEGATIVE
Resp Syncytial Virus by PCR: NEGATIVE
SARS Coronavirus 2 by RT PCR: NEGATIVE

## 2024-11-17 MED ORDER — IBUPROFEN 400 MG PO TABS
600.0000 mg | ORAL_TABLET | Freq: Once | ORAL | Status: AC
  Administered 2024-11-17: 600 mg via ORAL
  Filled 2024-11-17: qty 1

## 2024-11-17 NOTE — ED Triage Notes (Signed)
 Pt states flu like Sx. X couple days. With HA and cough, chest burning due to cough.

## 2024-11-17 NOTE — ED Notes (Signed)
 ED Provider at bedside.

## 2024-11-18 MED ORDER — GUAIFENESIN-CODEINE 100-10 MG/5ML PO SOLN
5.0000 mL | Freq: Once | ORAL | Status: AC
  Administered 2024-11-18: 5 mL via ORAL
  Filled 2024-11-18: qty 5

## 2024-11-18 MED ORDER — OSELTAMIVIR PHOSPHATE 75 MG PO CAPS: 75.0000 mg | ORAL_CAPSULE | Freq: Two times a day (BID) | ORAL | 0 refills | Status: AC

## 2024-11-18 MED ORDER — HYDROCOD POLI-CHLORPHE POLI ER 10-8 MG/5ML PO SUER: 5.0000 mL | Freq: Every evening | ORAL | 0 refills | Status: AC | PRN

## 2024-11-18 NOTE — ED Provider Notes (Signed)
 " [place] [facility] Provider Note   CSN: [id number] Arrival date & time: 11/17/24  2117     Patient presents with: URI   [name] is a 54 y.o. female.   The history is provided by the patient.  Patient with a history of hypertension presents with flulike illness.  Patient reports less than 40 hours ago she started having cough, body aches and sore throat.  She is not having fever and fatigue.  She reports chest burning with cough. No vomiting has had mild diarrhea. She reports sick contacts. No other acute complaints     Prior to Admission medications  Medication Sig Start Date End Date Taking? Authorizing Provider  chlorpheniramine-HYDROcodone  (TUSSIONEX) 10-8 MG/5ML Take 5 mLs by mouth at bedtime as needed for cough. 11/18/24  Yes Midge Golas, MD  oseltamivir  (TAMIFLU ) 75 MG capsule Take 1 capsule (75 mg total) by mouth every 12 (twelve) hours. 11/18/24  Yes Midge Golas, MD  topiramate (TOPAMAX) 50 MG tablet Topamax 50 mg tablet  Take 1 tablet twice a day by oral route.    [provider]    Allergies: Midazolam and Midazolam hcl    Review of Systems  Constitutional:  Positive for chills, fatigue and fever.  Respiratory:  Positive for cough.     Updated Vital Signs BP (!) 174/100 (BP Location: Right Arm)   Pulse 91   Temp 99.3 F (37.4 C) (Oral)   Resp 20   Ht 1.549 m (5' 1)   Wt 68 kg   LMP 10/22/2024 (Approximate)   SpO2 100%   BMI 28.34 kg/m   Physical Exam CONSTITUTIONAL: Well developed/well nourished HEAD: Normocephalic/atraumatic EYES: EOMI/PERRL ENMT: Mucous membranes moist, uvula midline, no stridor, no drooling NECK: supple no meningeal signs CV: S1/S2 noted, no murmurs/rubs/gallops noted LUNGS: Lungs are clear to auscultation bilaterally, no apparent distress ABDOMEN: soft, nontender, no rebound or guarding, bowel sounds noted throughout abdomen GU:no cva tenderness NEURO: Pt is  awake/alert/appropriate, moves all extremitiesx4.  No facial droop.   EXTREMITIES: pulses normal/equal, full ROM SKIN: warm, color normal  (all labs ordered are listed, but only abnormal results are displayed) Labs Reviewed  RESP PANEL BY RT-PCR (RSV, FLU A&B, COVID)  [id number] - Abnormal; Notable for the following components:      Result Value   Influenza A by PCR POSITIVE (*)    All other components within normal limits    EKG: None  Radiology: No results found.   Procedures   Medications Ordered in the ED  ibuprofen  (ADVIL ) tablet 600 mg (600 mg Oral Given 11/17/24 2225)  guaiFENesin -codeine  100-10 MG/5ML solution 5 mL (5 mLs Oral Given 11/18/24 0012)                                    Medical Decision Making Risk OTC drugs. Prescription drug management.   Patient presents with flulike illness.  Viral testing reveals influenza A. Patient is in no acute distress, lung sounds are clear, no hypoxia. Heart rate is improving Patient is safe for outpatient management.  She would like to trial Tamiflu . Discussed appropriate use and limitations of this medication. Patient also requests cough suppressant as she reports the OTC meds are not working     Final diagnoses:  Influenza    ED Discharge Orders          Ordered    oseltamivir  (TAMIFLU )  75 MG capsule  Every 12 hours        11/18/24 0006    chlorpheniramine-HYDROcodone  (TUSSIONEX) 10-8 MG/5ML  At bedtime PRN        11/18/24 0011               Midge Golas, MD 11/18/24 9960  "
